# Patient Record
Sex: Female | Born: 1985 | Race: Black or African American | Hispanic: Yes | Marital: Single | Smoking: Former smoker
Health system: Southern US, Community
[De-identification: ages and names within clinical notes are randomized; demographics above are authoritative.]

## PROBLEM LIST (undated history)

## (undated) DIAGNOSIS — R011 Cardiac murmur, unspecified: Secondary | ICD-10-CM

## (undated) DIAGNOSIS — T7840XA Allergy, unspecified, initial encounter: Secondary | ICD-10-CM

## (undated) DIAGNOSIS — J45909 Unspecified asthma, uncomplicated: Secondary | ICD-10-CM

## (undated) HISTORY — DX: Unspecified asthma, uncomplicated: J45.909

## (undated) HISTORY — PX: LAPAROSCOPY: SHX197

## (undated) HISTORY — PX: COLPOSCOPY: SHX161

## (undated) HISTORY — DX: Allergy, unspecified, initial encounter: T78.40XA

## (undated) HISTORY — PX: WISDOM TOOTH EXTRACTION: SHX21

---
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
## 2017-01-30 ENCOUNTER — Encounter (HOSPITAL_COMMUNITY): Payer: Self-pay | Admitting: Family Medicine

## 2017-01-30 ENCOUNTER — Ambulatory Visit (HOSPITAL_COMMUNITY)
Admission: EM | Admit: 2017-01-30 | Discharge: 2017-01-30 | Disposition: A | Discharge status: Home / Self Care | Payer: Self-pay | Attending: Family Medicine | Admitting: Family Medicine

## 2017-01-30 DIAGNOSIS — R51 Headache: Secondary | ICD-10-CM

## 2017-01-30 DIAGNOSIS — R519 Headache, unspecified: Secondary | ICD-10-CM

## 2017-01-30 DIAGNOSIS — G43109 Migraine with aura, not intractable, without status migrainosus: Secondary | ICD-10-CM

## 2017-01-30 MED ORDER — SUMATRIPTAN SUCCINATE 100 MG PO TABS: 100.0000 mg | ORAL_TABLET | ORAL | 0 refills | Status: DC | PRN

## 2017-01-30 MED ORDER — METHYLPREDNISOLONE 4 MG PO TBPK: ORAL_TABLET | ORAL | 0 refills | Status: DC

## 2017-01-30 MED ORDER — CYCLOBENZAPRINE HCL 5 MG PO TABS: 5.0000 mg | ORAL_TABLET | Freq: Three times a day (TID) | ORAL | 0 refills | Status: DC | PRN

## 2017-01-30 NOTE — ED Provider Notes (Signed)
CSN: [id number]     Arrival date & time 01/30/17  1212 History   None    Chief Complaint  Patient presents with  . Headache   (Consider location/radiation/quality/duration/timing/severity/associated sxs/prior Treatment) Patient c/o headaches. She has hx of migraine headaches.  She has an occipital headache for last 5 days.  She gets occasional migraine sx's.   The history is provided by the patient.  Headache  Pain location:  Occipital Quality:  Dull Radiates to:  Does not radiate Severity currently:  5/10 Severity at highest:  10/10 Onset quality:  Sudden Duration:  5 days Timing:  Constant Progression:  Worsening Chronicity:  New Similar to prior headaches: no   Relieved by:  Nothing Worsened by:  Nothing   History reviewed. No pertinent past medical history. History reviewed. No pertinent surgical history. History reviewed. No pertinent family history. Social History  Substance Use Topics  . Smoking status: Not on file  . Smokeless tobacco: Not on file  . Alcohol use Not on file   OB History    No data available     Review of Systems  Constitutional: Negative.   HENT: Negative.   Eyes: Negative.   Respiratory: Negative.   Cardiovascular: Negative.   Gastrointestinal: Negative.   Endocrine: Negative.   Genitourinary: Negative.   Musculoskeletal: Negative.   Allergic/Immunologic: Negative.   Neurological: Positive for headaches.  Hematological: Negative.   Psychiatric/Behavioral: Negative.     Allergies  Benadryl [diphenhydramine hcl]  Home Medications   Prior to Admission medications   Medication Sig Start Date End Date Taking? Authorizing Provider  cyclobenzaprine (FLEXERIL) 5 MG tablet Take 1 tablet (5 mg total) by mouth 3 (three) times daily as needed for muscle spasms. 01/30/17   Deatra Canter, FNP  methylPREDNISolone (MEDROL DOSEPAK) 4 MG TBPK tablet Take 6-5-4-3-2-1 po qd 01/30/17   Deatra Canter, FNP  SUMAtriptan (IMITREX) 100 MG tablet  Take 1 tablet (100 mg total) by mouth every 2 (two) hours as needed for migraine. May repeat in 2 hours if headache persists or recurs. 01/30/17   Deatra Canter, FNP   Meds Ordered and Administered this Visit  Medications - No data to display  BP (!) 102/57   Pulse 71   Temp 98.6 F (37 C)   Resp 18   SpO2 100%  No data found.   Physical Exam  Constitutional: She is oriented to person, place, and time. She appears well-developed and well-nourished.  HENT:  Head: Normocephalic and atraumatic.  Eyes: Pupils are equal, round, and reactive to light. Conjunctivae and EOM are normal.  Neck: Normal range of motion. Neck supple.  Cardiovascular: Normal rate, regular rhythm and normal heart sounds.   Pulmonary/Chest: Effort normal and breath sounds normal.  Abdominal: Soft. Bowel sounds are normal.  Neurological: She is alert and oriented to person, place, and time.  Nursing note and vitals reviewed.   Urgent Care Course     Procedures (including critical care time)  Labs Review Labs Reviewed - No data to display  Imaging Review No results found.   Visual Acuity Review  Right Eye Distance:   Left Eye Distance:   Bilateral Distance:    Right Eye Near:   Left Eye Near:    Bilateral Near:         MDM   1. Acute nonintractable headache, unspecified headache type   2. Migraine with aura and without status migrainosus, not intractable    Imitrex 100mg  one po with onset of  headache and repeat in 2 hours prn #10  Medrol dose pack as directed 4mg  #21 Flexeril 5mg  one po tid prn #30      Deatra CanterOxford, Zakia Sainato J, FNP 01/30/17 1327

## 2017-01-30 NOTE — ED Triage Notes (Signed)
Pt here for headache since Thursday. sts in the back of her head. sts that the pain is constant.

## 2017-05-07 ENCOUNTER — Ambulatory Visit (HOSPITAL_COMMUNITY)
Admission: EM | Admit: 2017-05-07 | Discharge: 2017-05-07 | Disposition: A | Discharge status: Home / Self Care | Payer: Self-pay | Attending: Emergency Medicine | Admitting: Emergency Medicine

## 2017-05-07 ENCOUNTER — Encounter (HOSPITAL_COMMUNITY): Payer: Self-pay | Admitting: Emergency Medicine

## 2017-05-07 DIAGNOSIS — R591 Generalized enlarged lymph nodes: Secondary | ICD-10-CM

## 2017-05-07 NOTE — Discharge Instructions (Signed)
Warm compresses, anti-inflammatories

## 2017-05-07 NOTE — ED Provider Notes (Signed)
[facility]    CSN: [id number] Arrival date & time: 05/07/17  1614     History   Chief Complaint Chief Complaint  Patient presents with  . Mass    HPI Tina [name] is a 31 y.o. female.   31 year-old female, presenting today complaining of possible cyst under her chin  States that this has been present for the past 3-4 years. She noticed pain under her chin this morning when she palpated the are. She is concerned it may be related to the cyst No fever, chills, difficulty swallowing    The history is provided by the patient.  Abscess  Location:  Face Facial abscess location:  Chin Size:  1 cm Abscess quality: painful   Red streaking: no   Progression:  Worsening Pain details:    Quality:  Dull   Severity:  Mild   Timing:  Constant   Progression:  Unchanged Chronicity:  Recurrent Context: not diabetes, not immunosuppression, not injected drug use, not insect bite/sting and not skin injury   Relieved by:  Nothing Exacerbated by: palpation. Ineffective treatments:  None tried Associated symptoms: no anorexia, no fatigue, no fever, no headaches, no nausea and no vomiting   Risk factors: no family hx of MRSA, no hx of MRSA and no prior abscess     History reviewed. No pertinent past medical history.  There are no active problems to display for this patient.   History reviewed. No pertinent surgical history.  OB History    No data available       Home Medications    Prior to Admission medications   Medication Sig Start Date End Date Taking? Authorizing Provider  cyclobenzaprine (FLEXERIL) 5 MG tablet Take 1 tablet (5 mg total) by mouth 3 (three) times daily as needed for muscle spasms. 01/30/17   Deatra Canterxford, William J, FNP  methylPREDNISolone (MEDROL DOSEPAK) 4 MG TBPK tablet Take 6-5-4-3-2-1 po qd 01/30/17   Deatra Canterxford, William J, FNP  SUMAtriptan (IMITREX) 100 MG tablet Take 1 tablet (100 mg total) by mouth every 2 (two) hours as needed for migraine. May  repeat in 2 hours if headache persists or recurs. 01/30/17   Deatra Canterxford, William J, FNP    Family History History reviewed. No pertinent family history.  Social History Social History  Substance Use Topics  . Smoking status: Not on file  . Smokeless tobacco: Not on file  . Alcohol use Not on file     Allergies   Benadryl [diphenhydramine hcl]   Review of Systems Review of Systems  Constitutional: Negative for chills, fatigue and fever.  HENT: Negative for ear pain and sore throat.   Eyes: Negative for pain and visual disturbance.  Respiratory: Negative for cough and shortness of breath.   Cardiovascular: Negative for chest pain and palpitations.  Gastrointestinal: Negative for abdominal pain, anorexia, nausea and vomiting.  Genitourinary: Negative for dysuria and hematuria.  Musculoskeletal: Negative for arthralgias and back pain.  Skin: Negative for color change and rash.       Swelling under the chin   Neurological: Negative for seizures, syncope and headaches.  All other systems reviewed and are negative.    Physical Exam Triage Vital Signs ED Triage Vitals  Enc Vitals Group     BP 05/07/17 1707 115/66     Pulse Rate 05/07/17 1707 73     Resp 05/07/17 1707 18     Temp 05/07/17 1707 98.4 F (36.9 C)     Temp Source 05/07/17 1707  Oral     SpO2 05/07/17 1707 100 %     Weight --      Height --      Head Circumference --      Peak Flow --      Pain Score 05/07/17 1708 6     Pain Loc --      Pain Edu? --      Excl. in GC? --    No data found.   Updated Vital Signs BP 115/66 (BP Location: Right Arm)   Pulse 73   Temp 98.4 F (36.9 C) (Oral)   Resp 18   SpO2 100%   Visual Acuity Right Eye Distance:   Left Eye Distance:   Bilateral Distance:    Right Eye Near:   Left Eye Near:    Bilateral Near:     Physical Exam  Constitutional: She is oriented to person, place, and time. She appears well-developed and well-nourished. No distress.  HENT:  Head:  Normocephalic.  Small area of swelling under the right side of the chin consistent with lymphadenopathy.  Eyes: Pupils are equal, round, and reactive to light. EOM are normal.  Neck: Normal range of motion.  Cardiovascular: Normal rate.   Pulmonary/Chest: Effort normal and breath sounds normal.  Abdominal: Soft.  Musculoskeletal: Normal range of motion.  Neurological: She is alert and oriented to person, place, and time.  Skin: Skin is warm. She is not diaphoretic.  Psychiatric: She has a normal mood and affect.  Nursing note and vitals reviewed.    UC Treatments / Results  Labs (all labs ordered are listed, but only abnormal results are displayed) Labs Reviewed - No data to display  EKG  EKG Interpretation None       Radiology No results found.  Procedures Procedures (including critical care time)  Medications Ordered in UC Medications - No data to display   Initial Impression / Assessment and Plan / UC Course  I have reviewed the triage vital signs and the nursing notes.  Pertinent labs & imaging results that were available during my care of the patient were reviewed by me and considered in my medical decision making (see chart for details).     Lymphadenopathy.   Final Clinical Impressions(s) / UC Diagnoses   Final diagnoses:  Lymphadenopathy of head and neck    New Prescriptions Discharge Medication List as of 05/07/2017  5:38 PM       Controlled Substance Prescriptions [place] Controlled Substance Registry consulted? Not Applicable   Alecia Lemming, [place] 05/07/17 1821

## 2017-05-07 NOTE — ED Triage Notes (Signed)
Pt sts knot under chin noticed today that is tender to palpation

## 2017-06-07 ENCOUNTER — Encounter (HOSPITAL_COMMUNITY): Payer: Self-pay | Admitting: *Deleted

## 2017-06-07 ENCOUNTER — Ambulatory Visit (HOSPITAL_COMMUNITY)
Admission: EM | Admit: 2017-06-07 | Discharge: 2017-06-07 | Disposition: A | Discharge status: Home / Self Care | Payer: Self-pay | Attending: Emergency Medicine | Admitting: Emergency Medicine

## 2017-06-07 DIAGNOSIS — L03012 Cellulitis of left finger: Secondary | ICD-10-CM

## 2017-06-07 MED ORDER — CEPHALEXIN 500 MG PO CAPS: 500.0000 mg | ORAL_CAPSULE | Freq: Three times a day (TID) | ORAL | 0 refills | Status: AC

## 2017-06-07 NOTE — Discharge Instructions (Signed)
Soak finger twice a day.  Complete course of antibiotics. If symptoms worsen or do not improve in the next week to return to be seen or to follow up with PCP.

## 2017-06-07 NOTE — ED Triage Notes (Signed)
Patient reports pulling cuticle to left middle finger and now has swelling and discoloration.

## 2017-06-07 NOTE — ED Provider Notes (Signed)
[facility]    CSN: [id number] Arrival date & time: 06/07/17  1144     History   Chief Complaint Chief Complaint  Patient presents with  . Finger Injury    HPI Tina [name] is a 31 y.o. female.   Tina [name] presents with complaints of left hand middle finger pain and swelling. She states she pulled a cuticle from the finger last week. Swelling and tenderness developed approximately 11/25. Has not drained. Rates pain 7/10. Without fevers or chills. Has been soaking it and applying tea tree oil and neosporin which have not helped. No known history of MRSA. Full ROM to finger and hand.   ROS per HPI.       History reviewed. No pertinent past medical history.  There are no active problems to display for this patient.   History reviewed. No pertinent surgical history.  OB History    No data available       Home Medications    Prior to Admission medications   Medication Sig Start Date End Date Taking? Authorizing Provider  cephALEXin (KEFLEX) 500 MG capsule Take 1 capsule (500 mg total) by mouth 3 (three) times daily for 7 days. 06/07/17 06/14/17  Georgetta HaberBurky, Brittane Grudzinski B, NP  cyclobenzaprine (FLEXERIL) 5 MG tablet Take 1 tablet (5 mg total) by mouth 3 (three) times daily as needed for muscle spasms. 01/30/17   Deatra Canterxford, William J, FNP  methylPREDNISolone (MEDROL DOSEPAK) 4 MG TBPK tablet Take 6-5-4-3-2-1 po qd 01/30/17   Deatra Canterxford, William J, FNP  SUMAtriptan (IMITREX) 100 MG tablet Take 1 tablet (100 mg total) by mouth every 2 (two) hours as needed for migraine. May repeat in 2 hours if headache persists or recurs. 01/30/17   Deatra Canterxford, William J, FNP    Family History Family History  Problem Relation Age of Onset  . Diabetes Mother   . Hypertension Father     Social History Social History   Tobacco Use  . Smoking status: Never Smoker  . Smokeless tobacco: Never Used  Substance Use Topics  . Alcohol use: Yes    Frequency: Never    Comment: occ  . Drug use: Not on  file     Allergies   Benadryl [diphenhydramine hcl]   Review of Systems Review of Systems   Physical Exam Triage Vital Signs ED Triage Vitals  Enc Vitals Group     BP 06/07/17 1229 107/72     Pulse Rate 06/07/17 1229 86     Resp 06/07/17 1229 17     Temp 06/07/17 1229 98.3 F (36.8 C)     Temp Source 06/07/17 1229 Oral     SpO2 06/07/17 1229 100 %     Weight --      Height --      Head Circumference --      Peak Flow --      Pain Score 06/07/17 1227 0     Pain Loc --      Pain Edu? --      Excl. in GC? --    No data found.  Updated Vital Signs BP 107/72 (BP Location: Left Arm)   Pulse 86   Temp 98.3 F (36.8 C) (Oral)   Resp 17   LMP 05/13/2017   SpO2 100%   Visual Acuity Right Eye Distance:   Left Eye Distance:   Bilateral Distance:    Right Eye Near:   Left Eye Near:    Bilateral Near:     Physical Exam  Constitutional: She is oriented to person, place, and time. She appears well-developed and well-nourished. No distress.  Cardiovascular: Normal rate, regular rhythm and normal heart sounds.  Pulmonary/Chest: Effort normal and breath sounds normal.  Musculoskeletal:       Left hand: She exhibits tenderness and swelling. She exhibits normal range of motion, no bony tenderness, normal capillary refill, no deformity and no laceration. Normal sensation noted. Normal strength noted.  Left hand middle finger with lateral nail bed with swelling and tenderness noted, consistent with paronychia; without drainage  Neurological: She is alert and oriented to person, place, and time.  Skin: Skin is warm and dry.  Vitals reviewed.    UC Treatments / Results  Labs (all labs ordered are listed, but only abnormal results are displayed) Labs Reviewed - No data to display  EKG  EKG Interpretation None       Radiology No results found.  Procedures Procedures (including critical care time)  Medications Ordered in UC Medications - No data to  display   Initial Impression / Assessment and Plan / UC Course  I have reviewed the triage vital signs and the nursing notes.  Pertinent labs & imaging results that were available during my care of the patient were reviewed by me and considered in my medical decision making (see chart for details).     Continue to soak finger twice a day. Complete course of antibiotics. Return precautions provided. Patient verbalized understanding and agreeable to plan.     Final Clinical Impressions(s) / UC Diagnoses   Final diagnoses:  Paronychia of finger of left hand    ED Discharge Orders        Ordered    cephALEXin (KEFLEX) 500 MG capsule  3 times daily     06/07/17 1253       Controlled Substance Prescriptions [place] Controlled Substance Registry consulted? Not Applicable   Georgetta HaberBurky, Tyreque Finken B, NP 06/07/17 1301

## 2017-08-20 ENCOUNTER — Other Ambulatory Visit (HOSPITAL_COMMUNITY): Payer: Self-pay | Admitting: *Deleted

## 2017-08-20 DIAGNOSIS — N6320 Unspecified lump in the left breast, unspecified quadrant: Secondary | ICD-10-CM

## 2017-08-23 ENCOUNTER — Ambulatory Visit
Admission: RE | Admit: 2017-08-23 | Discharge: 2017-08-23 | Disposition: A | Discharge status: Home / Self Care | Payer: No Typology Code available for payment source | Source: Ambulatory Visit | Attending: Obstetrics and Gynecology | Admitting: Obstetrics and Gynecology

## 2017-08-23 ENCOUNTER — Encounter (HOSPITAL_COMMUNITY): Payer: Self-pay

## 2017-08-23 ENCOUNTER — Other Ambulatory Visit (HOSPITAL_COMMUNITY): Payer: Self-pay | Admitting: Obstetrics and Gynecology

## 2017-08-23 ENCOUNTER — Ambulatory Visit (HOSPITAL_COMMUNITY)
Admission: RE | Admit: 2017-08-23 | Discharge: 2017-08-23 | Disposition: A | Discharge status: Home / Self Care | Payer: Self-pay | Source: Ambulatory Visit | Attending: Obstetrics and Gynecology | Admitting: Obstetrics and Gynecology

## 2017-08-23 VITALS — BP 110/68 | Ht 59.0 in | Wt 103.4 lb

## 2017-08-23 DIAGNOSIS — N6320 Unspecified lump in the left breast, unspecified quadrant: Secondary | ICD-10-CM

## 2017-08-23 DIAGNOSIS — N6321 Unspecified lump in the left breast, upper outer quadrant: Secondary | ICD-10-CM

## 2017-08-23 DIAGNOSIS — Z1239 Encounter for other screening for malignant neoplasm of breast: Secondary | ICD-10-CM

## 2017-08-23 DIAGNOSIS — N644 Mastodynia: Secondary | ICD-10-CM

## 2017-08-23 HISTORY — DX: Cardiac murmur, unspecified: R01.1

## 2017-08-23 NOTE — Patient Instructions (Signed)
Explained breast self awareness with Tina RichesAmanda [name]. Patient did not need a Pap smear today due to last Pap smear was in March 2017 per patient. Let her know [facility] will cover Pap smears every 3 years unless has a history of abnormal Pap smears. Reminded patient that her next Pap smear is due next March 2020. [facility] and left breast ultrasound. Appointment scheduled for Thursday, August 23, 2017 at 1250. Patient aware of appointment and will be there. Tina RichesAmanda [name] verbalized understanding.  Tina [name], Tina Maserhristine Poll, RN 10:59 AM

## 2017-08-23 NOTE — Progress Notes (Signed)
Complaints of left breast lump since January and pain that started on 08/18/2017. Patient states she noticed the lump increased in size and starting causing pain this past weekend. Patient states the pain comes and goes. Patient rates the pain at a 3-4 out of 10.  Pap Smear: Pap smear not completed today. [place] in [place] and normal per patient. Per patient has a history of an abnormal Pap smear in 2008 or 2009 that a colposcopy was completed for follow-up. Patient states all Pap smears have been normal since colposcopy and that she has had at least three normal Pap smears. No Pap smear results are in Epic.  Physical exam: Breasts Breasts symmetrical. No skin abnormalities bilateral breasts. No nipple retraction bilateral breasts. No nipple discharge bilateral breasts. No lymphadenopathy. No lumps palpated right breast. Palpated a lump within the left breast at 1 o'clock 5 cm from the nipple. Complaints of left outer breast tenderness on exam. [facility] diagnostic mamm[facility] and left breast ultrasound. Appointment scheduled for Thursday, August 23, 2017 at 1250.        Pelvic/Bimanual No Pap smear completed today since last Pap smear was in March 2017 per patient. Pap smear not indicated per BCCCP guidelines.   Smoking History: Patient has never smoked.  Patient Navigation: Patient education provided. [facility].

## 2017-08-24 ENCOUNTER — Encounter (HOSPITAL_COMMUNITY): Payer: Self-pay | Admitting: *Deleted

## 2017-12-27 ENCOUNTER — Other Ambulatory Visit (HOSPITAL_COMMUNITY): Payer: Self-pay | Admitting: *Deleted

## 2017-12-27 DIAGNOSIS — N6320 Unspecified lump in the left breast, unspecified quadrant: Secondary | ICD-10-CM

## 2018-01-03 ENCOUNTER — Emergency Department (HOSPITAL_BASED_OUTPATIENT_CLINIC_OR_DEPARTMENT_OTHER)
Admission: EM | Admit: 2018-01-03 | Discharge: 2018-01-03 | Disposition: A | Discharge status: Home / Self Care | Payer: Self-pay | Attending: Emergency Medicine | Admitting: Emergency Medicine

## 2018-01-03 ENCOUNTER — Other Ambulatory Visit: Payer: Self-pay

## 2018-01-03 ENCOUNTER — Encounter (HOSPITAL_BASED_OUTPATIENT_CLINIC_OR_DEPARTMENT_OTHER): Payer: Self-pay | Admitting: Emergency Medicine

## 2018-01-03 DIAGNOSIS — M542 Cervicalgia: Secondary | ICD-10-CM | POA: Insufficient documentation

## 2018-01-03 LAB — RAPID STREP SCREEN (MED CTR MEBANE ONLY): Streptococcus, Group A Screen (Direct): NEGATIVE

## 2018-01-03 NOTE — Discharge Instructions (Signed)
Try Tylenol or Ibuprofen for pain Return if your are worsening

## 2018-01-03 NOTE — ED Notes (Signed)
ED Provider at bedside. 

## 2018-01-03 NOTE — ED Provider Notes (Signed)
[facility] EMERGENCY DEPARTMENT Provider Note   CSN: [id number] Arrival date & time: 01/03/18  1501     History   Chief Complaint Chief Complaint  Patient presents with  . Neck Pain    HPI Tina [name] is a 32 y.o. female who presents with right sided neck pain. No significant PMH. She states that she first noticed the pain yesterday. She reports she had some discomfort over the right side of her neck and some nausea. She drank tea which seemed to make her symptoms worse. Nothing had made it better. She has never had these symptoms before. She denies fever, chills, posterior neck pain, neck trauma, numbness/tingling/weakness of the arms. She denies headache, URI symptoms, sore throat. She is unsure of any sick contacts but recently got back from a trip from [place]. She is concerned because she has been diagnosed with a cyst under her chin in the past. It has slowly gotten bigger but has not been painful, red, draining, etc.  HPI  Past Medical History:  Diagnosis Date  . Heart murmur     There are no active problems to display for this patient.   Past Surgical History:  Procedure Laterality Date  . BREAST EXCISIONAL BIOPSY Left 2007  . COLPOSCOPY    . LAPAROSCOPY    . WISDOM TOOTH EXTRACTION       OB History    Gravida  0   Para  0   Term  0   Preterm  0   AB  0   Living  0     SAB  0   TAB  0   Ectopic  0   Multiple  0   Live Births  0            Home Medications    Prior to Admission medications   Medication Sig Start Date End Date Taking? Authorizing Provider  cyclobenzaprine (FLEXERIL) 5 MG tablet Take 1 tablet (5 mg total) by mouth 3 (three) times daily as needed for muscle spasms. Patient not taking: Reported on 08/23/2017 01/30/17   Deatra Canter, FNP  methylPREDNISolone (MEDROL DOSEPAK) 4 MG TBPK tablet Take 6-5-4-3-2-1 po qd Patient not taking: Reported on 08/23/2017 01/30/17   Deatra Canter, FNP  SUMAtriptan  (IMITREX) 100 MG tablet Take 1 tablet (100 mg total) by mouth every 2 (two) hours as needed for migraine. May repeat in 2 hours if headache persists or recurs. Patient not taking: Reported on 08/23/2017 01/30/17   Deatra Canter, FNP    Family History Family History  Problem Relation Age of Onset  . Diabetes Mother   . Hypertension Father   . Cancer Maternal Aunt        colon  . Lupus Maternal Aunt   . Breast cancer Maternal Grandmother   . Breast cancer Paternal Grandmother   . Hypertension Maternal Aunt   . Diabetes Maternal Aunt     Social History Social History   Tobacco Use  . Smoking status: Never Smoker  . Smokeless tobacco: Never Used  Substance Use Topics  . Alcohol use: Yes    Frequency: Never    Comment: occ  . Drug use: No     Allergies   Benadryl [diphenhydramine hcl]   Review of Systems Review of Systems  Constitutional: Negative for fever.  Musculoskeletal: Positive for neck pain.     Physical Exam Updated Vital Signs BP 126/77   Pulse 99   Temp 98.1 F (36.7 C) (  Oral)   Resp 20   Ht 4\' 11"  (1.499 m)   Wt 50.8 kg (112 lb)   LMP 12/29/2017 (Exact Date)   SpO2 100%   BMI 22.62 kg/m   Physical Exam  Constitutional: She is oriented to person, place, and time. She appears well-developed and well-nourished. No distress.  HENT:  Head: Normocephalic and atraumatic.  Right Ear: Tympanic membrane normal.  Left Ear: Tympanic membrane normal.  Nose: Nose normal.  Mouth/Throat: Uvula is midline, oropharynx is clear and moist and mucous membranes are normal. No trismus in the jaw.  Eyes: Pupils are equal, round, and reactive to light. Conjunctivae are normal. Right eye exhibits no discharge. Left eye exhibits no discharge. No scleral icterus.  Neck: Normal range of motion.  Tenderness over the right anterior cervical chain  No tenderness over the chin  Cardiovascular: Normal rate and regular rhythm.  Pulmonary/Chest: Effort normal and breath  sounds normal. No respiratory distress.  Abdominal: She exhibits no distension.  Neurological: She is alert and oriented to person, place, and time.  Skin: Skin is warm and dry.  Psychiatric: She has a normal mood and affect. Her behavior is normal.  Nursing note and vitals reviewed.    ED Treatments / Results  Labs (all labs ordered are listed, but only abnormal results are displayed) Labs Reviewed  RAPID STREP SCREEN ([facility] ONLY)  CULTURE, GROUP A STREP [facility])    EKG None  Radiology No results found.  Procedures Procedures (including critical care time)  Medications Ordered in ED Medications - No data to display   Initial Impression / Assessment and Plan / ED Course  I have reviewed the triage vital signs and the nursing notes.  Pertinent labs & imaging results that were available during my care of the patient were reviewed by me and considered in my medical decision making (see chart for details).  32 year old presents with right sided anterior neck pain for one day. Her vitals are normal. She is well appearing. She is tender over the anterior cervical chain however I do not feel or observe any significant lymphadenopathy. Her HENT exam is unremarkable. Will obtain rapid strep.  Strep is negative. She was given reassurance and return precautions for worsening symptoms.  Final Clinical Impressions(s) / ED Diagnoses   Final diagnoses:  Anterior neck pain    ED Discharge Orders    None       Bethel BornGekas, Christalynn Bo[place] Marie, PA-C 01/03/18 1714    Gwyneth SproutPlunkett, [place], MD 01/04/18 1328

## 2018-01-03 NOTE — ED Triage Notes (Signed)
Reports right sided neck pain which began yesterday.  Denies pain when swallowing but states tender on palpation.

## 2018-01-05 LAB — CULTURE, GROUP A STREP (THRC)

## 2018-01-08 ENCOUNTER — Ambulatory Visit (HOSPITAL_COMMUNITY): Payer: Self-pay

## 2018-01-08 ENCOUNTER — Other Ambulatory Visit: Payer: Self-pay

## 2018-02-27 ENCOUNTER — Ambulatory Visit
Admission: RE | Admit: 2018-02-27 | Discharge: 2018-02-27 | Disposition: A | Discharge status: Home / Self Care | Payer: Self-pay | Source: Ambulatory Visit | Attending: Obstetrics and Gynecology | Admitting: Obstetrics and Gynecology

## 2018-02-27 ENCOUNTER — Other Ambulatory Visit (HOSPITAL_COMMUNITY): Payer: Self-pay | Admitting: Obstetrics and Gynecology

## 2018-02-27 DIAGNOSIS — N6320 Unspecified lump in the left breast, unspecified quadrant: Secondary | ICD-10-CM

## 2018-08-07 ENCOUNTER — Emergency Department (HOSPITAL_BASED_OUTPATIENT_CLINIC_OR_DEPARTMENT_OTHER)
Admission: EM | Admit: 2018-08-07 | Discharge: 2018-08-07 | Disposition: A | Discharge status: Home / Self Care | Payer: BC Managed Care – PPO | Attending: Emergency Medicine | Admitting: Emergency Medicine

## 2018-08-07 ENCOUNTER — Emergency Department (HOSPITAL_BASED_OUTPATIENT_CLINIC_OR_DEPARTMENT_OTHER): Payer: BC Managed Care – PPO

## 2018-08-07 ENCOUNTER — Encounter (HOSPITAL_BASED_OUTPATIENT_CLINIC_OR_DEPARTMENT_OTHER): Payer: Self-pay | Admitting: *Deleted

## 2018-08-07 ENCOUNTER — Other Ambulatory Visit: Payer: Self-pay

## 2018-08-07 DIAGNOSIS — N939 Abnormal uterine and vaginal bleeding, unspecified: Secondary | ICD-10-CM | POA: Diagnosis not present

## 2018-08-07 DIAGNOSIS — N898 Other specified noninflammatory disorders of vagina: Secondary | ICD-10-CM | POA: Diagnosis present

## 2018-08-07 DIAGNOSIS — R1030 Lower abdominal pain, unspecified: Secondary | ICD-10-CM | POA: Diagnosis not present

## 2018-08-07 DIAGNOSIS — N760 Acute vaginitis: Secondary | ICD-10-CM

## 2018-08-07 DIAGNOSIS — B9689 Other specified bacterial agents as the cause of diseases classified elsewhere: Secondary | ICD-10-CM | POA: Insufficient documentation

## 2018-08-07 LAB — WET PREP, GENITAL
Sperm: NONE SEEN
Trich, Wet Prep: NONE SEEN
Yeast Wet Prep HPF POC: NONE SEEN

## 2018-08-07 LAB — URINALYSIS, MICROSCOPIC (REFLEX)

## 2018-08-07 LAB — CBC WITH DIFFERENTIAL/PLATELET
Abs Immature Granulocytes: 0.02 10*3/uL (ref 0.00–0.07)
BASOS ABS: 0 10*3/uL (ref 0.0–0.1)
BASOS PCT: 0 %
EOS ABS: 0.3 10*3/uL (ref 0.0–0.5)
Eosinophils Relative: 5 %
HEMATOCRIT: 36.6 % (ref 36.0–46.0)
Hemoglobin: 11.4 g/dL — ABNORMAL LOW (ref 12.0–15.0)
Immature Granulocytes: 0 %
LYMPHS ABS: 1.9 10*3/uL (ref 0.7–4.0)
Lymphocytes Relative: 34 %
MCH: 29.4 pg (ref 26.0–34.0)
MCHC: 31.1 g/dL (ref 30.0–36.0)
MCV: 94.3 fL (ref 80.0–100.0)
MONOS PCT: 10 %
Monocytes Absolute: 0.6 10*3/uL (ref 0.1–1.0)
NRBC: 0 % (ref 0.0–0.2)
Neutro Abs: 2.8 10*3/uL (ref 1.7–7.7)
Neutrophils Relative %: 51 %
Platelets: 305 10*3/uL (ref 150–400)
RBC: 3.88 MIL/uL (ref 3.87–5.11)
RDW: 12.8 % (ref 11.5–15.5)
WBC: 5.6 10*3/uL (ref 4.0–10.5)

## 2018-08-07 LAB — PREGNANCY, URINE: Preg Test, Ur: NEGATIVE

## 2018-08-07 LAB — URINALYSIS, ROUTINE W REFLEX MICROSCOPIC
Bilirubin Urine: NEGATIVE
GLUCOSE, UA: NEGATIVE mg/dL
Ketones, ur: NEGATIVE mg/dL
LEUKOCYTES UA: NEGATIVE
Nitrite: NEGATIVE
PROTEIN: NEGATIVE mg/dL
SPECIFIC GRAVITY, URINE: 1.02 (ref 1.005–1.030)
pH: 7 (ref 5.0–8.0)

## 2018-08-07 MED ORDER — METRONIDAZOLE 500 MG PO TABS: 500.0000 mg | ORAL_TABLET | Freq: Two times a day (BID) | ORAL | 0 refills | Status: DC

## 2018-08-07 NOTE — ED Provider Notes (Signed)
[facility] EMERGENCY DEPARTMENT Provider Note   CSN: [id number] Arrival date & time: 08/07/18  1614     History   Chief Complaint Chief Complaint  Patient presents with  . Vaginal Discharge    HPI Tina [name] is a 33 y.o. female.  HPI 33 year old female with no pertinent past medical history presents to the emergency department today for evaluation of vaginal bleeding, vaginal discharge and some lower abdominal pain.  Patient states that yesterday she noticed some vaginal discharge.  She reports that 5 days ago she started her period.  Typically her period is light at the end and having beginning however this time she had some light bleeding at first and has had heavier bleeding yesterday and today.  Patient denies any passage of blood clots.  She reports some lower abdominal cramping.  She denies any focal abdominal pain.  Denies any fevers, chills, nausea or vomiting.  She does see GYN and has been treated for BV in the past.  Patient denies any urinary symptoms.  She has taken no medications for her symptoms prior to arrival.  She is sexually active with one female partner does not use protection but has no concern for STD.  She states that she is going through approximately 4 pads a day which is not unusual for patient.  She is just concerned that it was more heavy at the end of her period not be getting. Past Medical History:  Diagnosis Date  . Heart murmur     There are no active problems to display for this patient.   Past Surgical History:  Procedure Laterality Date  . BREAST EXCISIONAL BIOPSY Left 2007  . COLPOSCOPY    . LAPAROSCOPY    . WISDOM TOOTH EXTRACTION       OB History    Gravida  0   Para  0   Term  0   Preterm  0   AB  0   Living  0     SAB  0   TAB  0   Ectopic  0   Multiple  0   Live Births  0            Home Medications    Prior to Admission medications   Medication Sig Start Date End Date Taking? Authorizing  Provider  metroNIDAZOLE (FLAGYL) 500 MG tablet Take 1 tablet (500 mg total) by mouth 2 (two) times daily with a meal. DO NOT CONSUME ALCOHOL WHILE TAKING THIS MEDICATION. 08/07/18   Rise Mu, PA-C    Family History Family History  Problem Relation Age of Onset  . Diabetes Mother   . Hypertension Father   . Cancer Maternal Aunt        colon  . Lupus Maternal Aunt   . Breast cancer Maternal Grandmother   . Breast cancer Paternal Grandmother   . Hypertension Maternal Aunt   . Diabetes Maternal Aunt     Social History Social History   Tobacco Use  . Smoking status: Never Smoker  . Smokeless tobacco: Never Used  Substance Use Topics  . Alcohol use: Yes    Frequency: Never    Comment: occ  . Drug use: No     Allergies   Benadryl [diphenhydramine hcl]   Review of Systems Review of Systems  Constitutional: Negative for chills and fever.  Respiratory: Negative for shortness of breath.   Cardiovascular: Negative for chest pain.  Gastrointestinal: Positive for abdominal pain. Negative for nausea and  vomiting.  Genitourinary: Positive for vaginal bleeding and vaginal discharge. Negative for dysuria, frequency, hematuria, pelvic pain and vaginal pain.  Musculoskeletal: Negative for myalgias.  Skin: Negative for color change.  Neurological: Negative for dizziness, light-headedness and headaches.     Physical Exam Updated Vital Signs BP 118/72 (BP Location: Right Arm)   Pulse 65   Temp 98.4 F (36.9 C) (Oral)   Resp 16   Ht 4\' 11"  (1.499 m)   Wt 47.6 kg   LMP 08/03/2018   SpO2 100%   BMI 21.21 kg/m   Physical Exam Vitals signs and nursing note reviewed.  Constitutional:      General: She is not in acute distress.    Appearance: She is well-developed. She is not toxic-appearing.  HENT:     Head: Normocephalic and atraumatic.     Nose: Nose normal.  Eyes:     General:        Right eye: No discharge.        Left eye: No discharge.      Conjunctiva/sclera: Conjunctivae normal.     Pupils: Pupils are equal, round, and reactive to light.  Neck:     Musculoskeletal: Normal range of motion and neck supple.  Cardiovascular:     Rate and Rhythm: Normal rate and regular rhythm.     Heart sounds: Normal heart sounds.  Pulmonary:     Effort: Pulmonary effort is normal. No respiratory distress.     Breath sounds: Normal breath sounds.  Chest:     Chest wall: No tenderness.  Abdominal:     General: Bowel sounds are normal.     Palpations: Abdomen is soft.     Tenderness: There is no abdominal tenderness. There is no right CVA tenderness, left CVA tenderness, guarding or rebound.  Genitourinary:    Comments: Chaperone present for exam. No external lesions, swelling, erythema, or rash of the labia. No erythema, discharge, or lesions noted in the vaginal vault. No CMT tenderness, or friability.  Blood noted in vaginal vault.  There is no clots noted.  No adnexal tenderness, mass or fullness bilaterally. No inguinal adenopathy or hernia.   Musculoskeletal: Normal range of motion.        General: No tenderness.  Lymphadenopathy:     Cervical: No cervical adenopathy.  Skin:    General: Skin is warm and dry.     Capillary Refill: Capillary refill takes less than 2 seconds.  Neurological:     Mental Status: She is alert and oriented to person, place, and time.  Psychiatric:        Behavior: Behavior normal.        Thought Content: Thought content normal.        Judgment: Judgment normal.      ED Treatments / Results  Labs (all labs ordered are listed, but only abnormal results are displayed) Labs Reviewed  WET PREP, GENITAL - Abnormal; Notable for the following components:      Result Value   Clue Cells Wet Prep HPF POC PRESENT (*)    WBC, Wet Prep HPF POC FEW (*)    All other components within normal limits  URINALYSIS, ROUTINE W REFLEX MICROSCOPIC - Abnormal; Notable for the following components:   Hgb urine dipstick  LARGE (*)    All other components within normal limits  URINALYSIS, MICROSCOPIC (REFLEX) - Abnormal; Notable for the following components:   Bacteria, UA RARE (*)    All other components within normal limits  CBC  WITH DIFFERENTIAL/PLATELET - Abnormal; Notable for the following components:   Hemoglobin 11.4 (*)    All other components within normal limits  PREGNANCY, URINE  GC/CHLAMYDIA PROBE AMP () NOT AT [facility]    EKG None  Radiology US Transvaginal Non-ob  Result Date: 08/07/2018 CLINICAL DATA:  Initial evaluation for acute bilateral pelvic pain. EXAM: TRANSABDOMINAL AND TRANSVAGINAL ULTRASOUND OF PELVIS DOPPLER ULTRASOUND OF OVARIES TECHNIQUE: Both transabdominal and transvaginal ultrasound examinations of the pelvis were performed. Transabdominal technique was performed for global imaging of the pelvis including uterus, ovaries, adnexal regions, and pelvic cul-de-sac. It was necessary to proceed with endovaginal exam following the transabdominal exam to visualize the uterus, endometrium, and ovaries. Color and duplex Doppler ultrasound was utilized to evaluate blood flow to the ovaries. COMPARISON:  None. FINDINGS: Uterus Measurements: 4.9 x 2.7 x 2.2 cm = volume: 23 mL. Uterus demonstrates a heterogeneous echotexture without discrete fibroid or other mass lesion. Endometrium Thickness: 4 mm.  No focal abnormality visualized. Right ovary Measurements: 2.9 x 2.5 x 2.3 cm = volume: 9 mL. Normal appearance/no adnexal mass. Left ovary Not visualized.  No adnexal mass. Pulsed Doppler evaluation of the right ovary demonstrates normal low-resistance arterial and venous waveforms. Other findings Small volume free fluid within the pelvis, presumably physiologic. IMPRESSION: 1. No acute abnormality within the pelvis. 2. Nonvisualization of the left ovary.  No adnexal mass. 3. Normal right ovary without evidence for torsion. 4. Normal sonographic appearance of the uterus and endometrium.  Electronically Signed   By: Rise Mu M.D.   On: 08/07/2018 18:39   US Pelvis Complete  Result Date: 08/07/2018 CLINICAL DATA:  Initial evaluation for acute bilateral pelvic pain. EXAM: TRANSABDOMINAL AND TRANSVAGINAL ULTRASOUND OF PELVIS DOPPLER ULTRASOUND OF OVARIES TECHNIQUE: Both transabdominal and transvaginal ultrasound examinations of the pelvis were performed. Transabdominal technique was performed for global imaging of the pelvis including uterus, ovaries, adnexal regions, and pelvic cul-de-sac. It was necessary to proceed with endovaginal exam following the transabdominal exam to visualize the uterus, endometrium, and ovaries. Color and duplex Doppler ultrasound was utilized to evaluate blood flow to the ovaries. COMPARISON:  None. FINDINGS: Uterus Measurements: 4.9 x 2.7 x 2.2 cm = volume: 23 mL. Uterus demonstrates a heterogeneous echotexture without discrete fibroid or other mass lesion. Endometrium Thickness: 4 mm.  No focal abnormality visualized. Right ovary Measurements: 2.9 x 2.5 x 2.3 cm = volume: 9 mL. Normal appearance/no adnexal mass. Left ovary Not visualized.  No adnexal mass. Pulsed Doppler evaluation of the right ovary demonstrates normal low-resistance arterial and venous waveforms. Other findings Small volume free fluid within the pelvis, presumably physiologic. IMPRESSION: 1. No acute abnormality within the pelvis. 2. Nonvisualization of the left ovary.  No adnexal mass. 3. Normal right ovary without evidence for torsion. 4. Normal sonographic appearance of the uterus and endometrium. Electronically Signed   By: Rise Mu M.D.   On: 08/07/2018 18:39   [place] Art/ven Flow Abd Pelv Doppler  Result Date: 08/07/2018 CLINICAL DATA:  Initial evaluation for acute bilateral pelvic pain. EXAM: TRANSABDOMINAL AND TRANSVAGINAL ULTRASOUND OF PELVIS DOPPLER ULTRASOUND OF OVARIES TECHNIQUE: Both transabdominal and transvaginal ultrasound examinations of the pelvis were  performed. Transabdominal technique was performed for global imaging of the pelvis including uterus, ovaries, adnexal regions, and pelvic cul-de-sac. It was necessary to proceed with endovaginal exam following the transabdominal exam to visualize the uterus, endometrium, and ovaries. Color and duplex Doppler ultrasound was utilized to evaluate blood flow to the ovaries. COMPARISON:  None. FINDINGS:  Uterus Measurements: 4.9 x 2.7 x 2.2 cm = volume: 23 mL. Uterus demonstrates a heterogeneous echotexture without discrete fibroid or other mass lesion. Endometrium Thickness: 4 mm.  No focal abnormality visualized. Right ovary Measurements: 2.9 x 2.5 x 2.3 cm = volume: 9 mL. Normal appearance/no adnexal mass. Left ovary Not visualized.  No adnexal mass. Pulsed Doppler evaluation of the right ovary demonstrates normal low-resistance arterial and venous waveforms. Other findings Small volume free fluid within the pelvis, presumably physiologic. IMPRESSION: 1. No acute abnormality within the pelvis. 2. Nonvisualization of the left ovary.  No adnexal mass. 3. Normal right ovary without evidence for torsion. 4. Normal sonographic appearance of the uterus and endometrium. Electronically Signed   By: Rise Mu M.D.   On: 08/07/2018 18:39    Procedures Procedures (including critical care time)  Medications Ordered in ED Medications - No data to display   Initial Impression / Assessment and Plan / ED Course  I have reviewed the triage vital signs and the nursing notes.  Pertinent labs & imaging results that were available during my care of the patient were reviewed by me and considered in my medical decision making (see chart for details).     Presents to the ED for evaluation of lower abdominal pain, vaginal bleeding, vaginal discharge.  Is currently on her menstrual cycle.  Vital signs reassuring.  Patient is not tachycardic or hypotensive.  She does not have any focal abdominal tenderness to  palpation.  No CVA tenderness.  Pelvic exam is as documented above.  Heart regular rate and rhythm.  Lungs clear to auscultation bilaterally.  Patient hemoglobin 11.5 unknown baseline but no signs of significant decrease in hemoglobin from hemorrhage.  Wet prep does show clue cells.  Patient given Flagyl for bacterial vaginosis.  Gonorrhea and Chlamydia cultures are pending however low suspicion for STD at this time.  UA shows no signs of infection.  She has a leukocytosis.  Urine pregnancy test was negative.  Ultrasound obtained is as documented above.  No acute findings in the pelvis.  Unable to visualize the left ovary however low suspicion for ovarian torsion given patient's presentation.  I offered birth control to patient however she declined.  Will give Flagyl.  Presentation not consistent with EID, ectopic pregnancy, appendicitis, diverticulitis, pyelonephritis, ureterolithiasis, bowel obstruction, cholecystitis, pancreatitis.  Patient to follow-up with GYN in outpatient setting.  Pt is hemodynamically stable, in NAD, & able to ambulate in the ED. Evaluation does not show pathology that would require ongoing emergent intervention or inpatient treatment. I explained the diagnosis to the patient. Pain has been managed & has no complaints prior to dc. Pt is comfortable with above plan and is stable for discharge at this time. All questions were answered prior to disposition. Strict return precautions for f/u to the ED were discussed. Encouraged follow up with PCP.   Final Clinical Impressions(s) / ED Diagnoses   Final diagnoses:  Vaginal discharge  BV (bacterial vaginosis)  Vaginal bleeding  Lower abdominal pain    ED Discharge Orders         Ordered    metroNIDAZOLE (FLAGYL) 500 MG tablet  2 times daily with meals     08/07/18 1901           Wallace Keller 08/07/18 1911    Gwyneth Sprout, MD 08/07/18 [phone]

## 2018-08-07 NOTE — ED Notes (Addendum)
Patient was asked if they could give a urine sample. Patient states that she can not at this time.

## 2018-08-07 NOTE — ED Notes (Signed)
Pt verbalizes understanding of d/c instructions and denies any further needs at this time. 

## 2018-08-07 NOTE — ED Triage Notes (Signed)
Pt c/o vaginal discharge x 6 days

## 2018-08-07 NOTE — Discharge Instructions (Addendum)
Work-up has been reassuring today.  Do have signs of bacterial vaginosis and have treated you with Flagyl.  In terms of your vaginal bleeding have offered you birth control but I understand you would like to defer to your GYN.  If you develop any worsening pain or worsening left lower quadrant abdominal pain return to the ED immediately.  Return for any other reason.

## 2018-08-08 LAB — GC/CHLAMYDIA PROBE AMP (~~LOC~~) NOT AT ARMC
CHLAMYDIA, DNA PROBE: NEGATIVE
Neisseria Gonorrhea: NEGATIVE

## 2018-09-02 ENCOUNTER — Other Ambulatory Visit: Payer: Self-pay

## 2018-09-22 ENCOUNTER — Telehealth: Payer: Self-pay | Admitting: General Practice

## 2018-09-22 NOTE — Telephone Encounter (Signed)
Called patient.  Advised need to reschedule appointment.  She requested callback tomorrow to reschedule.

## 2018-09-23 ENCOUNTER — Ambulatory Visit: Payer: BC Managed Care – PPO | Admitting: Family Medicine

## 2018-09-27 ENCOUNTER — Other Ambulatory Visit: Payer: Self-pay

## 2018-10-04 ENCOUNTER — Ambulatory Visit
Admission: RE | Admit: 2018-10-04 | Discharge: 2018-10-04 | Disposition: A | Discharge status: Home / Self Care | Payer: BC Managed Care – PPO | Source: Ambulatory Visit | Attending: Obstetrics and Gynecology | Admitting: Obstetrics and Gynecology

## 2018-10-04 ENCOUNTER — Other Ambulatory Visit (HOSPITAL_COMMUNITY): Payer: Self-pay | Admitting: Obstetrics and Gynecology

## 2018-10-04 ENCOUNTER — Other Ambulatory Visit: Payer: Self-pay

## 2018-10-04 DIAGNOSIS — N6320 Unspecified lump in the left breast, unspecified quadrant: Secondary | ICD-10-CM

## 2018-10-04 DIAGNOSIS — N63 Unspecified lump in unspecified breast: Secondary | ICD-10-CM

## 2019-01-14 ENCOUNTER — Telehealth (INDEPENDENT_AMBULATORY_CARE_PROVIDER_SITE_OTHER): Payer: BC Managed Care – PPO | Admitting: Family Medicine

## 2019-01-14 ENCOUNTER — Encounter: Payer: Self-pay | Admitting: Family Medicine

## 2019-01-14 DIAGNOSIS — J4520 Mild intermittent asthma, uncomplicated: Secondary | ICD-10-CM | POA: Diagnosis not present

## 2019-01-14 DIAGNOSIS — J4530 Mild persistent asthma, uncomplicated: Secondary | ICD-10-CM | POA: Insufficient documentation

## 2019-01-14 MED ORDER — ALBUTEROL SULFATE HFA 108 (90 BASE) MCG/ACT IN AERS: 2.0000 | INHALATION_SPRAY | Freq: Four times a day (QID) | RESPIRATORY_TRACT | 1 refills | Status: DC | PRN

## 2019-01-14 NOTE — Assessment & Plan Note (Signed)
-  Renew albuterol inhaler  -suspect allergy component as well, referral placed to allergist for allergy testing.

## 2019-01-14 NOTE — Progress Notes (Signed)
Tina [name] - 33 y.o. female MRN [id number]  Date of birth: Aug 18, 1985   This visit type was conducted due to national recommendations for restrictions regarding the COVID-19 Pandemic (e.g. social distancing).  This format is felt to be most appropriate for this patient at this time.  All issues noted in this document were discussed and addressed.  No physical exam was performed (except for noted visual exam findings with Video Visits).  I discussed the limitations of evaluation and management by telemedicine and the availability of in person appointments. The patient expressed understanding and agreed to proceed.  I connected with@ on 01/14/19 at 10:30 AM EDT by a video enabled telemedicine application and verified that I am speaking with the correct person using two identifiers.   Patient Location: Home [place] [id number]   Provider location:   [facility]  Chief Complaint  Patient presents with  . Establish Care    NP/requesting labs,    HPI  Tina [name] is a 33 y.o. female who presents via audio/video conferencing for a telehealth visit today.   This is her initial visit with me.  She has a history of asthma and allergies.  Asthma has been fairly well controlled.  Has used albuterol as needed in the past.  Has managed allergies with claritin-d and flonase previously.  Interested in having allergy testing to see what certain triggers are.  She denies any recent wheezing, cough, or shortness of breath.  She is currently out of albuterol.    She would also like to schedule a CPE with labs.  ROS:  A comprehensive ROS was completed and negative except as noted per HPI  Past Medical History:  Diagnosis Date  . Allergy   . Asthma   . Heart murmur     Past Surgical History:  Procedure Laterality Date  . BREAST EXCISIONAL BIOPSY Left 2007  . COLPOSCOPY    . LAPAROSCOPY    . WISDOM TOOTH EXTRACTION      Family History  Problem Relation Age of  Onset  . Diabetes Mother   . Hypertension Father   . Cancer Maternal Aunt        colon  . Lupus Maternal Aunt   . Breast cancer Maternal Grandmother   . Breast cancer Paternal Grandmother   . Hypertension Maternal Aunt   . Diabetes Maternal Aunt     Social History   Socioeconomic History  . Marital status: Single    Spouse name: Not on file  . Number of children: Not on file  . Years of education: Not on file  . Highest education level: Not on file  Occupational History  . Not on file  Social Needs  . Financial resource strain: Not on file  . Food insecurity    Worry: Not on file    Inability: Not on file  . Transportation needs    Medical: Not on file    Non-medical: Not on file  Tobacco Use  . Smoking status: Never Smoker  . Smokeless tobacco: Never Used  Substance and Sexual Activity  . Alcohol use: Yes    Frequency: Never    Comment: occ  . Drug use: No  . Sexual activity: Yes    Birth control/protection: Condom  Lifestyle  . Physical activity    Days per week: 0 days    Minutes per session: 0 min  . Stress: Not at all  Relationships  . Social connections  Talks on phone: More than three times a week    Gets together: Once a week    Attends religious service: Never    Active member of club or organization: Yes    Attends meetings of clubs or organizations: 1 to 4 times per year    Relationship status: Never married  . Intimate partner violence    Fear of current or ex partner: No    Emotionally abused: No    Physically abused: No    Forced sexual activity: No  Other Topics Concern  . Not on file  Social History Narrative  . Not on file     Current Outpatient Medications:  .  albuterol (VENTOLIN HFA) 108 (90 Base) MCG/ACT inhaler, Inhale 2 puffs into the lungs every 6 (six) hours as needed for wheezing or shortness of breath., Disp: 18 g, Rfl: 1 .  metroNIDAZOLE (FLAGYL) 500 MG tablet, Take 1 tablet (500 mg total) by mouth 2 (two) times daily  with a meal. DO NOT CONSUME ALCOHOL WHILE TAKING THIS MEDICATION., Disp: 14 tablet, Rfl: 0  EXAM:  VITALS per patient if applicable: BP 111/71 Comment: taken @ [facility]  Pulse 81   Temp 97.7 F (36.5 C) (Oral) Comment: reported from [facility].  Ht 4\' 11"  (1.499 m)   Wt 115 lb (52.2 kg) Comment: reported @ [facility]  LMP 01/08/2019 (Exact Date)   BMI 23.23 kg/m   GENERAL: alert, oriented, appears well and in no acute distress  HEENT: atraumatic, conjunttiva clear, no obvious abnormalities on inspection of external nose and ears  NECK: normal movements of the head and neck  LUNGS: on inspection no signs of respiratory distress, breathing rate appears normal, no obvious gross SOB, gasping or wheezing  CV: no obvious cyanosis  MS: moves all visible extremities without noticeable abnormality  PSYCH/NEURO: pleasant and cooperative, no obvious depression or anxiety, speech and thought processing grossly intact  ASSESSMENT AND PLAN:  Discussed the following assessment and plan:  Asthma -Renew albuterol inhaler  -suspect allergy component as well, referral placed to allergist for allergy testing.        I discussed the assessment and treatment plan with the patient. The patient was provided an opportunity to ask questions and all were answered. The patient agreed with the plan and demonstrated an understanding of the instructions.   The patient was advised to call back or seek an in-person evaluation if the symptoms worsen or if the condition fails to improve as anticipated.     Everrett Coombeody Jazzlyn Huizenga, DO

## 2019-01-15 NOTE — Progress Notes (Signed)
Pt scheduled  

## 2019-01-16 ENCOUNTER — Ambulatory Visit: Payer: BC Managed Care – PPO | Admitting: Family Medicine

## 2019-01-22 ENCOUNTER — Ambulatory Visit: Payer: BC Managed Care – PPO | Admitting: Allergy and Immunology

## 2019-01-22 ENCOUNTER — Other Ambulatory Visit: Payer: Self-pay

## 2019-01-22 ENCOUNTER — Encounter: Payer: Self-pay | Admitting: Allergy and Immunology

## 2019-01-22 VITALS — BP 126/68 | HR 96 | Temp 98.3°F | Resp 16 | Ht 58.66 in | Wt 112.2 lb

## 2019-01-22 DIAGNOSIS — J4530 Mild persistent asthma, uncomplicated: Secondary | ICD-10-CM

## 2019-01-22 DIAGNOSIS — J3089 Other allergic rhinitis: Secondary | ICD-10-CM | POA: Diagnosis not present

## 2019-01-22 DIAGNOSIS — T7800XD Anaphylactic reaction due to unspecified food, subsequent encounter: Secondary | ICD-10-CM | POA: Diagnosis not present

## 2019-01-22 DIAGNOSIS — T7800XA Anaphylactic reaction due to unspecified food, initial encounter: Secondary | ICD-10-CM | POA: Insufficient documentation

## 2019-01-22 MED ORDER — LEVOCETIRIZINE DIHYDROCHLORIDE 5 MG PO TABS: 5.0000 mg | ORAL_TABLET | Freq: Every day | ORAL | 5 refills | Status: DC | PRN

## 2019-01-22 MED ORDER — ALBUTEROL SULFATE HFA 108 (90 BASE) MCG/ACT IN AERS: INHALATION_SPRAY | RESPIRATORY_TRACT | 1 refills | Status: DC

## 2019-01-22 MED ORDER — EPINEPHRINE 0.3 MG/0.3ML IJ SOAJ: INTRAMUSCULAR | 3 refills | Status: DC

## 2019-01-22 MED ORDER — FLUTICASONE PROPIONATE 50 MCG/ACT NA SUSP: 2.0000 | Freq: Every day | NASAL | 5 refills | Status: DC | PRN

## 2019-01-22 MED ORDER — FLOVENT HFA 110 MCG/ACT IN AERO: 2.0000 | INHALATION_SPRAY | Freq: Two times a day (BID) | RESPIRATORY_TRACT | 5 refills | Status: DC

## 2019-01-22 NOTE — Assessment & Plan Note (Signed)
The patient's history suggests food allergy and positive skin test results today confirm this diagnosis.  Meticulous avoidance of banana as discussed.  A prescription has been provided for epinephrine auto-injector 2 pack along with instructions for proper administration.  A food allergy action plan has been provided and discussed.  Medic Alert identification is recommended.

## 2019-01-22 NOTE — Patient Instructions (Addendum)
Mild persistent asthma  A prescription has been provided for Flovent (fluticasone) 110 g, 2 inhalations twice a day. To maximize pulmonary deposition, a spacer has been provided along with instructions for its proper administration with an HFA inhaler.  During respiratory tract infections or asthma flares, increase Flovent 110g to 3 inhalations via spacer device 3 times per day until symptoms have returned to baseline.  Continue albuterol HFA, 1 to 2 inhalations every 4-6 hours if needed.  Subjective and objective measures of pulmonary function will be followed and the treatment plan will be adjusted accordingly.  Seasonal and perennial allergic rhinitis  Aeroallergen avoidance measures have been discussed and provided in written form.  A prescription has been provided for levocetirizine, 5 mg daily as needed.  A prescription has been provided for fluticasone nasal spray, 2 sprays per nostril daily as needed. Proper nasal spray technique has been discussed and demonstrated.  Nasal saline spray (i.e., Simply Saline) or nasal saline lavage (i.e., NeilMed) is recommended as needed and prior to medicated nasal sprays.  If allergen avoidance measures and medications fail to adequately relieve symptoms, aeroallergen immunotherapy will be considered.  Food allergy The patient's history suggests food allergy and positive skin test results today confirm this diagnosis.  Meticulous avoidance of banana as discussed.  A prescription has been provided for epinephrine auto-injector 2 pack along with instructions for proper administration.  A food allergy action plan has been provided and discussed.  Medic Alert identification is recommended.   Return in about 3 months (around 04/24/2019), or if symptoms worsen or fail to improve.  Control of Dust Mite Allergen  House dust mites play a major role in allergic asthma and rhinitis.  They occur in environments with high humidity wherever human  skin, the food for dust mites is found. High levels have been detected in dust obtained from mattresses, pillows, carpets, upholstered furniture, bed covers, clothes and soft toys.  The principal allergen of the house dust mite is found in its feces.  A gram of dust may contain 1,000 mites and 250,000 fecal particles.  Mite antigen is easily measured in the air during house cleaning activities.    1. Encase mattresses, including the box spring, and pillow, in an air tight cover.  Seal the zipper end of the encased mattresses with wide adhesive tape. 2. Wash the bedding in water of 130 degrees Farenheit weekly.  Avoid cotton comforters/quilts and flannel bedding: the most ideal bed covering is the dacron comforter. 3. Remove all upholstered furniture from the bedroom. 4. Remove carpets, carpet padding, rugs, and non-washable window drapes from the bedroom.  Wash drapes weekly or use plastic window coverings. 5. Remove all non-washable stuffed toys from the bedroom.  Wash stuffed toys weekly. 6. Have the room cleaned frequently with a vacuum cleaner and a damp dust-mop.  The patient should not be in a room which is being cleaned and should wait 1 hour after cleaning before going into the room. 7. Close and seal all heating outlets in the bedroom.  Otherwise, the room will become filled with dust-laden air.  An electric heater can be used to heat the room. Reduce indoor humidity to less than 50%.  Do not use a humidifier.   Reducing Pollen Exposure  The American Academy of Allergy, Asthma and Immunology suggests the following steps to reduce your exposure to pollen during allergy seasons.    1. Do not hang sheets or clothing out to dry; pollen may collect on these items. 2.  Do not mow lawns or spend time around freshly cut grass; mowing stirs up pollen. 3. Keep windows closed at night.  Keep car windows closed while driving. 4. Minimize morning activities outdoors, a time when pollen counts are  usually at their highest. 5. Stay indoors as much as possible when pollen counts or humidity is high and on windy days when pollen tends to remain in the air longer. 6. Use air conditioning when possible.  Many air conditioners have filters that trap the pollen spores. 7. Use a HEPA room air filter to remove pollen form the indoor air you breathe.   Control of Dog or Cat Allergen  Avoidance is the best way to manage a dog or cat allergy. If you have a dog or cat and are allergic to dog or cats, consider removing the dog or cat from the home. If you have a dog or cat but don't want to find it a new home, or if your family wants a pet even though someone in the household is allergic, here are some strategies that may help keep symptoms at bay:  1. Keep the pet out of your bedroom and restrict it to only a few rooms. Be advised that keeping the dog or cat in only one room will not limit the allergens to that room. 2. Don't pet, hug or kiss the dog or cat; if you do, wash your hands with soap and water. 3. High-efficiency particulate air (HEPA) cleaners run continuously in a bedroom or living room can reduce allergen levels over time. 4. Place electrostatic material sheet in the air inlet vent in the bedroom. 5. Regular use of a high-efficiency vacuum cleaner or a central vacuum can reduce allergen levels. 6. Giving your dog or cat a bath at least once a week can reduce airborne allergen.   Control of Mold Allergen  Mold and fungi can grow on a variety of surfaces provided certain temperature and moisture conditions exist.  Outdoor molds grow on plants, decaying vegetation and soil.  The major outdoor mold, Alternaria and Cladosporium, are found in very high numbers during hot and dry conditions.  Generally, a late Summer - Fall peak is seen for common outdoor fungal spores.  Rain will temporarily lower outdoor mold spore count, but counts rise rapidly when the rainy period ends.  The most important  indoor molds are Aspergillus and Penicillium.  Dark, humid and poorly ventilated basements are ideal sites for mold growth.  The next most common sites of mold growth are the bathroom and the kitchen.  Outdoor [employer] 1. Use air conditioning and keep windows closed 2. Avoid exposure to decaying vegetation. 3. Avoid leaf raking. 4. Avoid grain handling. 5. Consider wearing a face mask if working in moldy areas.  Indoor Mold Control 1. Maintain humidity below 50%. 2. Clean washable surfaces with 5% bleach solution. 3. Remove sources e.g. Contaminated carpets.   Control of Cockroach Allergen  Cockroach allergen has been identified as an important cause of acute attacks of asthma, especially in urban settings.  There are fifty-five species of cockroach that exist in the [place], however only three, the [place], [place] species produce allergen that can affect patients with Asthma.  Allergens can be obtained from fecal particles, egg casings and secretions from cockroaches.    1. Remove food sources. 2. Reduce access to water. 3. Seal access and entry points. 4. Spray runways with 0.5-1% Diazinon or Chlorpyrifos 5. Blow boric acid power under stoves  and refrigerator. 6. Place bait stations (hydramethylnon) at feeding sites.

## 2019-01-22 NOTE — Assessment & Plan Note (Addendum)
   Aeroallergen avoidance measures have been discussed and provided in written form.  A prescription has been provided for levocetirizine, 5 mg daily as needed.  A prescription has been provided for fluticasone nasal spray, 2 sprays per nostril daily as needed. Proper nasal spray technique has been discussed and demonstrated.  Nasal saline spray (i.e., Simply Saline) or nasal saline lavage (i.e., NeilMed) is recommended as needed and prior to medicated nasal sprays.  If allergen avoidance measures and medications fail to adequately relieve symptoms, aeroallergen immunotherapy will be considered. 

## 2019-01-22 NOTE — Progress Notes (Signed)
New Patient Note  RE: Tina [name] MRN: [id number] DOB: [birth year] Date of Office Visit: 01/22/2019  Referring provider: Everrett CoombeMatthews, Cody, DO Primary care provider: Everrett CoombeMatthews, Cody, DO  Chief Complaint: Asthma and Allergic Reaction  History of present illness: Tina [name] is a 33 y.o. female seen today in consultation requested by Everrett Coombeody Matthews, DO.  She reports that she has had asthma since early childhood with symptoms consisting of coughing, wheezing, chest tightness, and dyspnea.  These symptoms began to improve over the past 4 to 5 years, however recently she has begun to experience asthma symptoms twice a day on average.  In the past, her asthma symptoms were triggered by dust, extremes of temperature, vigorous exertion, and upper respiratory tract infections.  Recently, her asthma has been triggered when she first wakes up in the morning and when she takes her dog outside for a walk.  She was prescribed an albuterol rescue inhaler yesterday.  In the past she had been on montelukast, however she is currently not on a controller medication.  Tina [name] experiences sneezing, rhinorrhea, and postnasal drainage.  No significant seasonal symptom variation has been noted nor have specific environmental triggers been identified.  She had taken loratadine/pseudoephedrine and fluticasone nasal spray to control these symptoms in the past, however is currently not taking allergy medications. She also reports that in 2009 she consumed a banana and felt like her "throat was closing."  She has not consumed banana since that time.  In addition, when she reports that the consumption of dairy products and/or eggs causes coughing.  She does not experience concomitant urticaria, angioedema, or GI symptoms.  Assessment and plan: Mild persistent asthma  A prescription has been provided for Flovent (fluticasone) 110 g, 2 inhalations twice a day. To maximize pulmonary deposition, a spacer has been provided along  with instructions for its proper administration with an HFA inhaler.  During respiratory tract infections or asthma flares, increase Flovent 110g to 3 inhalations via spacer device 3 times per day until symptoms have returned to baseline.  Continue albuterol HFA, 1 to 2 inhalations every 4-6 hours if needed.  Subjective and objective measures of pulmonary function will be followed and the treatment plan will be adjusted accordingly.  Seasonal and perennial allergic rhinitis  Aeroallergen avoidance measures have been discussed and provided in written form.  A prescription has been provided for levocetirizine, 5 mg daily as needed.  A prescription has been provided for fluticasone nasal spray, 2 sprays per nostril daily as needed. Proper nasal spray technique has been discussed and demonstrated.  Nasal saline spray (i.e., Simply Saline) or nasal saline lavage (i.e., NeilMed) is recommended as needed and prior to medicated nasal sprays.  If allergen avoidance measures and medications fail to adequately relieve symptoms, aeroallergen immunotherapy will be considered.  Food allergy The patient's history suggests food allergy and positive skin test results today confirm this diagnosis.  Meticulous avoidance of banana as discussed.  A prescription has been provided for epinephrine auto-injector 2 pack along with instructions for proper administration.  A food allergy action plan has been provided and discussed.  Medic Alert identification is recommended.   Meds ordered this encounter  Medications  . fluticasone (FLOVENT HFA) 110 MCG/ACT inhaler    Sig: Inhale 2 puffs into the lungs 2 (two) times a day. Rinse, gargle and spit out after use    Dispense:  1 Inhaler    Refill:  5  . albuterol (VENTOLIN HFA) 108 (90 Base) MCG/ACT inhaler  Sig: 2 puffs every 4-6 hours as needed for coughing or wheezing spells    Dispense:  18 g    Refill:  1  . levocetirizine (XYZAL) 5 MG tablet     Sig: Take 1 tablet (5 mg total) by mouth daily as needed for allergies.    Dispense:  30 tablet    Refill:  5  . fluticasone (FLONASE) 50 MCG/ACT nasal spray    Sig: Place 2 sprays into both nostrils daily as needed for allergies or rhinitis.    Dispense:  16 g    Refill:  5  . EPINEPHrine (AUVI-Q) 0.3 mg/0.3 mL IJ SOAJ injection    Sig: Use as directed for severe allergic reactions    Dispense:  4 each    Refill:  3    Diagnostics: Spirometry: FVC was 2.52 L and FEV1 was 2.09 L (91% predicted) with 90 mL postbronchodilator improvement.  This study was performed while the patient was asymptomatic.  Please see scanned spirometry results for details. Environmental skin testing: Positive to grass pollen, weed pollen, ragweed pollen, tree pollen, molds, cat hair, dog epithelia, cockroach antigen, and dust mite antigen. Food allergen skin testing: Positive to banana.    Physical examination: Blood pressure 126/68, pulse 96, temperature 98.3 F (36.8 C), temperature source Oral, resp. rate 16, height 4' 10.66" (1.49 m), weight 112 lb 3.4 oz (50.9 kg), last menstrual period 01/08/2019, SpO2 98 %.  General: Alert, interactive, in no acute distress. HEENT: TMs pearly gray, turbinates edematous and pale without discharge, post-pharynx mildly erythematous. Neck: Supple without lymphadenopathy. Lungs: Clear to auscultation without wheezing, rhonchi or rales. CV: Normal S1, S2 without murmurs. Abdomen: Nondistended, nontender. Skin: Warm and dry, without lesions or rashes. Extremities:  No clubbing, cyanosis or edema. Neuro:   Grossly intact.  Review of systems:  Review of systems negative except as noted in HPI / PMHx or noted below: Review of Systems  Constitutional: Negative.   HENT: Negative.   Eyes: Negative.   Respiratory: Negative.   Cardiovascular: Negative.   Gastrointestinal: Negative.   Genitourinary: Negative.   Musculoskeletal: Negative.   Skin: Negative.   Neurological:  Negative.   Endo/Heme/Allergies: Negative.   Psychiatric/Behavioral: Negative.     Past medical history:  Past Medical History:  Diagnosis Date  . Allergy   . Asthma   . Heart murmur     Past surgical history:  Past Surgical History:  Procedure Laterality Date  . BREAST EXCISIONAL BIOPSY Left 2007  . COLPOSCOPY    . LAPAROSCOPY    . WISDOM TOOTH EXTRACTION      Family history: Family History  Problem Relation Age of Onset  . Diabetes Mother   . Hypertension Father   . Asthma Father   . Cancer Maternal Aunt        colon  . Lupus Maternal Aunt   . Breast cancer Maternal Grandmother   . Breast cancer Paternal Grandmother   . Hypertension Maternal Aunt   . Diabetes Maternal Aunt   . Allergic rhinitis Sister   . Food Allergy Sister   . Eczema Sister   . Asthma Maternal Uncle     Social history: Social History   Socioeconomic History  . Marital status: Single    Spouse name: Not on file  . Number of children: Not on file  . Years of education: Not on file  . Highest education level: Not on file  Occupational History  . Not on file  Social Needs  .  Financial resource strain: Not on file  . Food insecurity    Worry: Not on file    Inability: Not on file  . Transportation needs    Medical: Not on file    Non-medical: Not on file  Tobacco Use  . Smoking status: Former Smoker    Types: E-cigarettes  . Smokeless tobacco: Never Used  Substance and Sexual Activity  . Alcohol use: Yes    Frequency: Never    Comment: occ  . Drug use: No  . Sexual activity: Yes    Birth control/protection: Condom  Lifestyle  . Physical activity    Days per week: 0 days    Minutes per session: 0 min  . Stress: Not at all  Relationships  . Social connections    Talks on phone: More than three times a week    Gets together: Once a week    Attends religious service: Never    Active member of club or organization: Yes    Attends meetings of clubs or organizations: 1 to 4  times per year    Relationship status: Never married  . Intimate partner violence    Fear of current or ex partner: No    Emotionally abused: No    Physically abused: No    Forced sexual activity: No  Other Topics Concern  . Not on file  Social History Narrative  . Not on file   Environmental History: The patient lives in a 32 year old apartment with carpeting throughout and central air/heat.  There is no known mold/water damage in the home.  She has a dog in the home which has access to her bedroom.  She is a non-smoker. She smoked e-cigarettes in the past.  Allergies as of 01/22/2019      Reactions   Banana Other (See Comments)   Throat swelling   Benadryl [diphenhydramine Hcl]       Medication List       Accurate as of January 22, 2019 12:35 PM. If you have any questions, ask your nurse or doctor.        STOP taking these medications   metroNIDAZOLE 500 MG tablet Commonly known as: Flagyl Stopped by: Wellington Hampshire Carter Avannah Decker, MD     TAKE these medications   albuterol 108 (90 Base) MCG/ACT inhaler Commonly known as: VENTOLIN HFA Inhale 2 puffs into the lungs every 6 (six) hours as needed for wheezing or shortness of breath. What changed: Another medication with the same name was added. Make sure you understand how and when to take each. Changed by: Wellington Hampshire Carter Aariel Ems, MD   albuterol 108 (90 Base) MCG/ACT inhaler Commonly known as: VENTOLIN HFA 2 puffs every 4-6 hours as needed for coughing or wheezing spells What changed: You were already taking a medication with the same name, and this prescription was added. Make sure you understand how and when to take each. Changed by: Wellington Hampshire Carter Sutton Hirsch, MD   EPINEPHrine 0.3 mg/0.3 mL Soaj injection Commonly known as: Auvi-Q Use as directed for severe allergic reactions Started by: Wellington Hampshire Carter Tennie Grussing, MD   Flovent HFA 110 MCG/ACT inhaler Generic drug: fluticasone Inhale 2 puffs into the lungs 2 (two) times a day. Rinse, gargle and spit out  after use Started by: Wellington Hampshire Carter Landa Mullinax, MD   fluticasone 50 MCG/ACT nasal spray Commonly known as: Flonase Place 2 sprays into both nostrils daily as needed for allergies or rhinitis. Started by: Wellington Hampshire Carter Curby Carswell, MD   levocetirizine 5 MG tablet Commonly  known as: XYZAL Take 1 tablet (5 mg total) by mouth daily as needed for allergies. Started by: Wellington Hampshire Carter Artavis Cowie, MD       Known medication allergies: Allergies  Allergen Reactions  . Banana Other (See Comments)    Throat swelling  . Benadryl [Diphenhydramine Hcl]     I appreciate the opportunity to take part in Maddox's care. Please do not hesitate to contact me with questions.  Sincerely,   R. Jorene Guestarter Rieley Hausman, MD

## 2019-01-22 NOTE — Assessment & Plan Note (Signed)
   A prescription has been provided for Flovent (fluticasone) 110 g, 2 inhalations twice a day. To maximize pulmonary deposition, a spacer has been provided along with instructions for its proper administration with an HFA inhaler.  During respiratory tract infections or asthma flares, increase Flovent 110g to 3 inhalations via spacer device 3 times per day until symptoms have returned to baseline.  Continue albuterol HFA, 1 to 2 inhalations every 4-6 hours if needed.  Subjective and objective measures of pulmonary function will be followed and the treatment plan will be adjusted accordingly.

## 2019-02-03 ENCOUNTER — Telehealth: Payer: Self-pay

## 2019-02-03 NOTE — Telephone Encounter (Signed)
Questions for Screening COVID-19  Symptom onset: None, Prescreened and #[id number] for arrival   Travel or Contacts: none During this illness, did/does the patient experience any of the following symptoms? Fever >100.65F []   Yes [x]   No []   Unknown Subjective fever (felt feverish) []   Yes [x]   No []   Unknown Chills []   Yes [x]   No []   Unknown Muscle aches (myalgia) []   Yes [x]   No []   Unknown Runny nose (rhinorrhea) []   Yes [x]   No []   Unknown Sore throat []   Yes [x]   No []   Unknown Cough (new onset or worsening of chronic cough) []   Yes [x]   No []   Unknown Shortness of breath (dyspnea) []   Yes [x]   No []   Unknown Nausea or vomiting []   Yes [x]   No []   Unknown Headache []   Yes []   No [x]   Unknown Abdominal pain  []   Yes [x]   No []   Unknown Diarrhea (?3 loose/looser than normal stools/24hr period) []   Yes [x]   No []   Unknown Other, specify:  Patient risk factors: Smoker? []   Current []   Former [x]   Never If female, currently pregnant? []   Yes [x]   No  Patient Active Problem List   Diagnosis Date Noted  . Seasonal and perennial allergic rhinitis 01/22/2019  . Food allergy 01/22/2019  . Mild persistent asthma 01/14/2019    Plan:  []   High risk for COVID-19 with red flags go to ED (with CP, SOB, weak/lightheaded, or fever > 101.5). Call ahead.  []   High risk for COVID-19 but stable. Inform provider and coordinate time for [facility] visit.   [x]   No red flags but URI signs or symptoms okay for [facility] visit.

## 2019-02-04 ENCOUNTER — Encounter: Payer: Self-pay | Admitting: Family Medicine

## 2019-02-04 ENCOUNTER — Ambulatory Visit (INDEPENDENT_AMBULATORY_CARE_PROVIDER_SITE_OTHER): Payer: BC Managed Care – PPO | Admitting: Family Medicine

## 2019-02-04 VITALS — BP 102/68 | HR 78 | Temp 98.3°F | Resp 16 | Ht <= 58 in | Wt 117.4 lb

## 2019-02-04 DIAGNOSIS — Z0000 Encounter for general adult medical examination without abnormal findings: Secondary | ICD-10-CM

## 2019-02-04 DIAGNOSIS — Z13220 Encounter for screening for lipoid disorders: Secondary | ICD-10-CM | POA: Diagnosis not present

## 2019-02-04 LAB — CBC WITH DIFFERENTIAL/PLATELET
Basophils Absolute: 0 10*3/uL (ref 0.0–0.1)
Basophils Relative: 0.5 % (ref 0.0–3.0)
Eosinophils Absolute: 0.2 10*3/uL (ref 0.0–0.7)
Eosinophils Relative: 4.5 % (ref 0.0–5.0)
HCT: 32.6 % — ABNORMAL LOW (ref 36.0–46.0)
Hemoglobin: 10.8 g/dL — ABNORMAL LOW (ref 12.0–15.0)
Lymphocytes Relative: 29.5 % (ref 12.0–46.0)
Lymphs Abs: 1.5 10*3/uL (ref 0.7–4.0)
MCHC: 33.1 g/dL (ref 30.0–36.0)
MCV: 92.4 fl (ref 78.0–100.0)
Monocytes Absolute: 0.4 10*3/uL (ref 0.1–1.0)
Monocytes Relative: 8.8 % (ref 3.0–12.0)
Neutro Abs: 2.9 10*3/uL (ref 1.4–7.7)
Neutrophils Relative %: 56.7 % (ref 43.0–77.0)
Platelets: 284 10*3/uL (ref 150.0–400.0)
RBC: 3.53 Mil/uL — ABNORMAL LOW (ref 3.87–5.11)
RDW: 13.3 % (ref 11.5–15.5)
WBC: 5 10*3/uL (ref 4.0–10.5)

## 2019-02-04 LAB — COMPREHENSIVE METABOLIC PANEL
ALT: 10 U/L (ref 0–35)
AST: 16 U/L (ref 0–37)
Albumin: 4.1 g/dL (ref 3.5–5.2)
Alkaline Phosphatase: 33 U/L — ABNORMAL LOW (ref 39–117)
BUN: 7 mg/dL (ref 6–23)
CO2: 27 mEq/L (ref 19–32)
Calcium: 9.2 mg/dL (ref 8.4–10.5)
Chloride: 105 mEq/L (ref 96–112)
Creatinine, Ser: 0.61 mg/dL (ref 0.40–1.20)
GFR: 137.58 mL/min (ref >60.00)
Glucose, Bld: 94 mg/dL (ref 70–99)
Potassium: 4.1 mEq/L (ref 3.5–5.1)
Sodium: 137 mEq/L (ref 135–145)
Total Bilirubin: 0.4 mg/dL (ref 0.2–1.2)
Total Protein: 6.9 g/dL (ref 6.0–8.3)

## 2019-02-04 LAB — LIPID PANEL
Cholesterol: 135 mg/dL (ref 0–200)
HDL: 51 mg/dL (ref >39.00)
LDL Cholesterol: 76 mg/dL (ref 0–99)
NonHDL: 84.18
Total CHOL/HDL Ratio: 3
Triglycerides: 42 mg/dL (ref 0.0–149.0)
VLDL: 8.4 mg/dL (ref 0.0–40.0)

## 2019-02-04 LAB — TSH: TSH: 3.23 u[IU]/mL (ref 0.35–4.50)

## 2019-02-04 NOTE — Assessment & Plan Note (Signed)
Well adult Submandibular area likely a cyst, will monitor routinely  Orders Placed This Encounter  Procedures  . Comp Met (CMET)  . CBC w/Diff  . TSH  . Lipid Profile  Screenings:  Lipid profile Immunizations: UTD Anticipatory guidance/Risk factor reduction:  Recommendations per AVS

## 2019-02-04 NOTE — Progress Notes (Signed)
Tina [name] - 33 y.o. female MRN [id number]  Date of birth: 1986/03/26  Subjective Chief Complaint  Patient presents with  . Annual Exam    CPE/Labs, Lump under chin- sonset yrs,     HPI Tina [name] is a 33 y.o. female here today for annual exam.  She has been in relatively good health and was recently evaluated by an allergist for chronic allergies and asthma.  Her symptoms are well controlled at this time.  She has a small lump under her chin that has been present for years, doesn't think it has really changed in size.  She denies pain with this area.   She follows a healthy diet.  She is a non-smoker.    Review of Systems  Constitutional: Negative for chills, fever, malaise/fatigue and weight loss.  HENT: Negative for congestion, ear pain and sore throat.   Eyes: Negative for blurred vision, double vision and pain.  Respiratory: Negative for cough and shortness of breath.   Cardiovascular: Negative for chest pain and palpitations.  Gastrointestinal: Negative for abdominal pain, blood in stool, constipation, heartburn and nausea.  Genitourinary: Negative for dysuria and urgency.  Musculoskeletal: Negative for joint pain and myalgias.  Neurological: Negative for dizziness and headaches.  Endo/Heme/Allergies: Does not bruise/bleed easily.  Psychiatric/Behavioral: Negative for depression. The patient is not nervous/anxious and does not have insomnia.     Allergies  Allergen Reactions  . Banana Other (See Comments)    Throat swelling  . Benadryl [Diphenhydramine Hcl]     Past Medical History:  Diagnosis Date  . Allergy   . Asthma   . Heart murmur     Past Surgical History:  Procedure Laterality Date  . BREAST EXCISIONAL BIOPSY Left 2007  . COLPOSCOPY    . LAPAROSCOPY    . WISDOM TOOTH EXTRACTION      Social History   Socioeconomic History  . Marital status: Single    Spouse name: Not on file  . Number of children: Not on file  . Years of education: Not on file   . Highest education level: Not on file  Occupational History  . Not on file  Social Needs  . Financial resource strain: Not hard at all  . Food insecurity    Worry: Never true    Inability: Never true  . Transportation needs    Medical: No    Non-medical: No  Tobacco Use  . Smoking status: Former Smoker    Types: E-cigarettes  . Smokeless tobacco: Never Used  Substance and Sexual Activity  . Alcohol use: Yes    Frequency: Never    Comment: occ  . Drug use: No  . Sexual activity: Yes    Birth control/protection: Condom  Lifestyle  . Physical activity    Days per week: 0 days    Minutes per session: 0 min  . Stress: Not at all  Relationships  . Social connections    Talks on phone: More than three times a week    Gets together: Once a week    Attends religious service: Never    Active member of club or organization: Yes    Attends meetings of clubs or organizations: 1 to 4 times per year    Relationship status: Never married  Other Topics Concern  . Not on file  Social History Narrative  . Not on file    Family History  Problem Relation Age of Onset  . Diabetes Mother   . Hypertension Father   .  Asthma Father   . Diabetes Father   . Cancer Maternal Aunt        colon  . Lupus Maternal Aunt   . Diabetes Maternal Aunt   . Breast cancer Maternal Grandmother   . Breast cancer Paternal Grandmother   . Diabetes Paternal Grandmother   . Hypertension Maternal Aunt   . Diabetes Maternal Aunt   . Diabetes Maternal Aunt   . Allergic rhinitis Sister   . Food Allergy Sister   . Eczema Sister   . Asthma Maternal Uncle   . Diabetes Paternal Grandfather     Health Maintenance  Topic Date Due  . HIV Screening  02/21/2002  . TETANUS/TDAP  02/21/2006  . PAP SMEAR-Modifier  02/22/2008  . INFLUENZA VACCINE  02/08/2019     ----------------------------------------------------------------------------------------------------------------------------------------------------------------------------------------------------------------- Physical Exam BP 102/68   Pulse 78   Temp 98.3 F (36.8 C) (Oral)   Resp 16   Ht 4' 3.98" (1.32 m)   Wt 117 lb 6.4 oz (53.3 kg)   LMP 01/08/2019 (Exact Date)   SpO2 99%   BMI 30.55 kg/m   Physical Exam Constitutional:      General: She is not in acute distress. HENT:     Head: Normocephalic and atraumatic.     Nose: Nose normal.  Eyes:     General: No scleral icterus.    Conjunctiva/sclera: Conjunctivae normal.  Neck:     Musculoskeletal: Normal range of motion and neck supple.     Thyroid: No thyromegaly.  Cardiovascular:     Rate and Rhythm: Normal rate and regular rhythm.     Heart sounds: Normal heart sounds.  Pulmonary:     Effort: Pulmonary effort is normal.     Breath sounds: Normal breath sounds.  Abdominal:     General: Bowel sounds are normal. There is no distension.     Palpations: Abdomen is soft.     Tenderness: There is no abdominal tenderness. There is no guarding.  Musculoskeletal: Normal range of motion.  Lymphadenopathy:     Cervical: No cervical adenopathy.  Skin:    General: Skin is warm and dry.     Findings: No rash.     Comments: Small, freely mobile submandibular nodule.   Neurological:     Mental Status: She is alert and oriented to person, place, and time.     Cranial Nerves: No cranial nerve deficit.     Coordination: Coordination normal.  Psychiatric:        Behavior: Behavior normal.     ------------------------------------------------------------------------------------------------------------------------------------------------------------------------------------------------------------------- Assessment and Plan  Well adult exam Well adult Submandibular area likely a cyst, will monitor routinely  Orders Placed This  Encounter  Procedures  . Comp Met (CMET)  . CBC w/Diff  . TSH  . Lipid Profile  Screenings:  Lipid profile Immunizations: UTD Anticipatory guidance/Risk factor reduction:  Recommendations per AVS

## 2019-02-04 NOTE — Patient Instructions (Signed)
Preventive Care 21-32 Years Old, Female Preventive care refers to visits with your health care provider and lifestyle choices that can promote health and wellness. This includes:  A yearly physical exam. This may also be called an annual well check.  Regular dental visits and eye exams.  Immunizations.  Screening for certain conditions.  Healthy lifestyle choices, such as eating a healthy diet, getting regular exercise, not using drugs or products that contain nicotine and tobacco, and limiting alcohol use. What can I expect for my preventive care visit? Physical exam Your health care provider will check your:  Height and weight. This may be used to calculate body mass index (BMI), which tells if you are at a healthy weight.  Heart rate and blood pressure.  Skin for abnormal spots. Counseling Your health care provider may ask you questions about your:  Alcohol, tobacco, and drug use.  Emotional well-being.  Home and relationship well-being.  Sexual activity.  Eating habits.  Work and work environment.  Method of birth control.  Menstrual cycle.  Pregnancy history. What immunizations do I need?  Influenza (flu) vaccine  This is recommended every year. Tetanus, diphtheria, and pertussis (Tdap) vaccine  You may need a Td booster every 10 years. Varicella (chickenpox) vaccine  You may need this if you have not been vaccinated. Human papillomavirus (HPV) vaccine  If recommended by your health care provider, you may need three doses over 6 months. Measles, mumps, and rubella (MMR) vaccine  You may need at least one dose of MMR. You may also need a second dose. Meningococcal conjugate (MenACWY) vaccine  One dose is recommended if you are age 19-21 years and a first-year college student living in a residence hall, or if you have one of several medical conditions. You may also need additional booster doses. Pneumococcal conjugate (PCV13) vaccine  You may need  this if you have certain conditions and were not previously vaccinated. Pneumococcal polysaccharide (PPSV23) vaccine  You may need one or two doses if you smoke cigarettes or if you have certain conditions. Hepatitis A vaccine  You may need this if you have certain conditions or if you travel or work in places where you may be exposed to hepatitis A. Hepatitis B vaccine  You may need this if you have certain conditions or if you travel or work in places where you may be exposed to hepatitis B. Haemophilus influenzae type b (Hib) vaccine  You may need this if you have certain conditions. You may receive vaccines as individual doses or as more than one vaccine together in one shot (combination vaccines). Talk with your health care provider about the risks and benefits of combination vaccines. What tests do I need?  Blood tests  Lipid and cholesterol levels. These may be checked every 5 years starting at age 20.  Hepatitis C test.  Hepatitis B test. Screening  Diabetes screening. This is done by checking your blood sugar (glucose) after you have not eaten for a while (fasting).  Sexually transmitted disease (STD) testing.  BRCA-related cancer screening. This may be done if you have a family history of breast, ovarian, tubal, or peritoneal cancers.  Pelvic exam and Pap test. This may be done every 3 years starting at age 21. Starting at age 30, this may be done every 5 years if you have a Pap test in combination with an HPV test. Talk with your health care provider about your test results, treatment options, and if necessary, the need for more tests.   Follow these instructions at home: Eating and drinking   Eat a diet that includes fresh fruits and vegetables, whole grains, lean protein, and low-fat dairy.  Take vitamin and mineral supplements as recommended by your health care provider.  Do not drink alcohol if: ? Your health care provider tells you not to drink. ? You are  pregnant, may be pregnant, or are planning to become pregnant.  If you drink alcohol: ? Limit how much you have to 0-1 drink a day. ? Be aware of how much alcohol is in your drink. In the [place] one drink equals one 12 oz bottle of beer (355 mL), one 5 oz glass of wine (148 mL), or one 1 oz glass of hard liquor (44 mL). Lifestyle  Take daily care of your teeth and gums.  Stay active. Exercise for at least 30 minutes on 5 or more days each week.  Do not use any products that contain nicotine or tobacco, such as cigarettes, e-cigarettes, and chewing tobacco. If you need help quitting, ask your health care provider.  If you are sexually active, practice safe sex. Use a condom or other form of birth control (contraception) in order to prevent pregnancy and STIs (sexually transmitted infections). If you plan to become pregnant, see your health care provider for a preconception visit. What's next?  Visit your health care provider once a year for a well check visit.  Ask your health care provider how often you should have your eyes and teeth checked.  Stay up to date on all vaccines. This information is not intended to replace advice given to you by your health care provider. Make sure you discuss any questions you have with your health care provider. Document Released: 08/22/2001 Document Revised: 03/07/2018 Document Reviewed: 03/07/2018 Elsevier Patient Education  2020 Elsevier Inc.  

## 2019-02-10 ENCOUNTER — Other Ambulatory Visit: Payer: Self-pay | Admitting: Family Medicine

## 2019-02-10 DIAGNOSIS — D649 Anemia, unspecified: Secondary | ICD-10-CM

## 2019-02-10 NOTE — Progress Notes (Signed)
Recent labs show some mild anemia.  I would recommend that we check an iron panel to investigate this further.  I have entered orders.  Please schedule lab visit.   Other labs are normal.    Thanks!

## 2019-02-11 ENCOUNTER — Telehealth: Payer: Self-pay

## 2019-02-11 NOTE — Telephone Encounter (Signed)

## 2019-02-12 ENCOUNTER — Other Ambulatory Visit (INDEPENDENT_AMBULATORY_CARE_PROVIDER_SITE_OTHER): Payer: BC Managed Care – PPO

## 2019-02-12 DIAGNOSIS — D649 Anemia, unspecified: Secondary | ICD-10-CM | POA: Diagnosis not present

## 2019-02-13 ENCOUNTER — Encounter: Payer: Self-pay | Admitting: *Deleted

## 2019-02-13 ENCOUNTER — Telehealth: Payer: Self-pay | Admitting: Allergy and Immunology

## 2019-02-13 LAB — IRON,TIBC AND FERRITIN PANEL
%SAT: 14 % (calc) — ABNORMAL LOW (ref 16–45)
Ferritin: 11 ng/mL — ABNORMAL LOW (ref 16–154)
Iron: 48 ug/dL (ref 40–190)
TIBC: 345 mcg/dL (calc) (ref 250–450)

## 2019-02-13 NOTE — Telephone Encounter (Signed)
PT called to see if Dr Shaune Leeks can write a Dr note stating her asthma conditions and options of work from home or come in and social distance or fewer than certain number of people in her classroom/area. She works for [employer]. 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  [place] [phone]

## 2019-02-13 NOTE — Telephone Encounter (Signed)
Letter typed waiting for doctor sig.

## 2019-02-13 NOTE — Telephone Encounter (Signed)
Correction [email or web address]

## 2019-02-13 NOTE — Telephone Encounter (Signed)
Pt informed letter is ready- emailed to [email or web address]

## 2019-02-17 ENCOUNTER — Telehealth: Payer: Self-pay | Admitting: Family Medicine

## 2019-02-17 NOTE — Telephone Encounter (Signed)
Pt called and she has been monitoring her oxygen levels and said it was at the lowest it has been at 96%. Dr Zigmund Daniel said that as long as it is above 95% it should be fine unless there were any concerns that she would want to talk to him about, she was fine with that and said she will continue monitoring and call back if neccessary.

## 2019-02-17 NOTE — Telephone Encounter (Signed)
Dr. Shaune Leeks was able to sign letter for her.

## 2019-02-17 NOTE — Telephone Encounter (Signed)
Noted. Thanks.

## 2019-02-17 NOTE — Telephone Encounter (Signed)
Where can I find the letter so I can review and sign it? Thanks.

## 2019-02-19 ENCOUNTER — Telehealth (INDEPENDENT_AMBULATORY_CARE_PROVIDER_SITE_OTHER): Payer: BC Managed Care – PPO | Admitting: Family Medicine

## 2019-02-19 ENCOUNTER — Encounter: Payer: Self-pay | Admitting: Family Medicine

## 2019-02-19 DIAGNOSIS — J45901 Unspecified asthma with (acute) exacerbation: Secondary | ICD-10-CM | POA: Insufficient documentation

## 2019-02-19 DIAGNOSIS — J4531 Mild persistent asthma with (acute) exacerbation: Secondary | ICD-10-CM | POA: Diagnosis not present

## 2019-02-19 MED ORDER — METHYLPREDNISOLONE 4 MG PO TBPK: ORAL_TABLET | ORAL | 0 refills | Status: DC

## 2019-02-19 NOTE — Progress Notes (Signed)
Tina [name] - 33 y.o. female MRN [id number]  Date of birth: 1986/02/16   This visit type was conducted due to national recommendations for restrictions regarding the COVID-19 Pandemic (e.g. social distancing).  This format is felt to be most appropriate for this patient at this time.  All issues noted in this document were discussed and addressed.  No physical exam was performed (except for noted visual exam findings with Video Visits).  I discussed the limitations of evaluation and management by telemedicine and the availability of in person appointments. The patient expressed understanding and agreed to proceed.  I connected with@ on 02/19/19 at  8:00 AM EDT by a video enabled telemedicine application and verified that I am speaking with the correct person using two identifiers.   Patient Location: Home [place] [address]   Provider location:   [facility]  Chief Complaint  Patient presents with  . Asthma    pt states oxygen level got to 96%, slight cough, when inhaling neck retracts/using flovent    HPI  Tina [name] is a 33 y.o. female who presents via audio/video conferencing for a telehealth visit today.  She reports symptoms of asthma flare.  Reports that she has felt shortness of breath with intermittent wheezing.  She has had cough, productive of clear mucus.  She is using flovent but has not tried her albuterol inhaler.  Her O2 sats are hovering around 96%.  She denies fever, chills, headache, body aches, congestion, nausea, vomiting, diarrhea.   ROS:  A comprehensive ROS was completed and negative except as noted per HPI  Past Medical History:  Diagnosis Date  . Allergy   . Asthma   . Heart murmur     Past Surgical History:  Procedure Laterality Date  . BREAST EXCISIONAL BIOPSY Left 2007  . COLPOSCOPY    . LAPAROSCOPY    . WISDOM TOOTH EXTRACTION      Family History  Problem Relation Age of Onset  . Diabetes Mother   .  Hypertension Father   . Asthma Father   . Diabetes Father   . Cancer Maternal Aunt        colon  . Lupus Maternal Aunt   . Diabetes Maternal Aunt   . Breast cancer Maternal Grandmother   . Breast cancer Paternal Grandmother   . Diabetes Paternal Grandmother   . Hypertension Maternal Aunt   . Diabetes Maternal Aunt   . Diabetes Maternal Aunt   . Allergic rhinitis Sister   . Food Allergy Sister   . Eczema Sister   . Asthma Maternal Uncle   . Diabetes Paternal Grandfather     Social History   Socioeconomic History  . Marital status: Single    Spouse name: Not on file  . Number of children: Not on file  . Years of education: Not on file  . Highest education level: Not on file  Occupational History  . Not on file  Social Needs  . Financial resource strain: Not hard at all  . Food insecurity    Worry: Never true    Inability: Never true  . Transportation needs    Medical: No    Non-medical: No  Tobacco Use  . Smoking status: Former Smoker    Types: E-cigarettes  . Smokeless tobacco: Never Used  Substance and Sexual Activity  . Alcohol use: Yes    Frequency: Never    Comment: occ  . Drug use: No  . Sexual activity:  Yes    Birth control/protection: Condom  Lifestyle  . Physical activity    Days per week: 0 days    Minutes per session: 0 min  . Stress: Not at all  Relationships  . Social connections    Talks on phone: More than three times a week    Gets together: Once a week    Attends religious service: Never    Active member of club or organization: Yes    Attends meetings of clubs or organizations: 1 to 4 times per year    Relationship status: Never married  . Intimate partner violence    Fear of current or ex partner: No    Emotionally abused: No    Physically abused: No    Forced sexual activity: No  Other Topics Concern  . Not on file  Social History Narrative  . Not on file     Current Outpatient Medications:  .  albuterol (VENTOLIN HFA) 108  (90 Base) MCG/ACT inhaler, Inhale 2 puffs into the lungs every 6 (six) hours as needed for wheezing or shortness of breath., Disp: 18 g, Rfl: 1 .  albuterol (VENTOLIN HFA) 108 (90 Base) MCG/ACT inhaler, 2 puffs every 4-6 hours as needed for coughing or wheezing spells, Disp: 18 g, Rfl: 1 .  EPINEPHrine (AUVI-Q) 0.3 mg/0.3 mL IJ SOAJ injection, Use as directed for severe allergic reactions, Disp: 4 each, Rfl: 3 .  fluticasone (FLONASE) 50 MCG/ACT nasal spray, Place 2 sprays into both nostrils daily as needed for allergies or rhinitis., Disp: 16 g, Rfl: 5 .  fluticasone (FLOVENT HFA) 110 MCG/ACT inhaler, Inhale 2 puffs into the lungs 2 (two) times a day. Rinse, gargle and spit out after use, Disp: 1 Inhaler, Rfl: 5 .  levocetirizine (XYZAL) 5 MG tablet, Take 1 tablet (5 mg total) by mouth daily as needed for allergies., Disp: 30 tablet, Rfl: 5 .  methylPREDNISolone (MEDROL DOSEPAK) 4 MG TBPK tablet, Take as directed on packaging., Disp: 21 tablet, Rfl: 0  EXAM:  VITALS per patient if applicable: There were no vitals taken for this visit.  GENERAL: alert, oriented, appears well and in no acute distress  HEENT: atraumatic, conjunttiva clear, no obvious abnormalities on inspection of external nose and ears  NECK: normal movements of the head and neck  LUNGS: on inspection no signs of respiratory distress, breathing rate appears normal, no obvious gross SOB, gasping or wheezing  CV: no obvious cyanosis  MS: moves all visible extremities without noticeable abnormality  PSYCH/NEURO: pleasant and cooperative, no obvious depression or anxiety, speech and thought processing grossly intact  ASSESSMENT AND PLAN:  Discussed the following assessment and plan:  Asthma exacerbation -Instructed on albuterol use every 4-6 hours as needed for cough, wheezing or shortness of breath.  -She may continue flovent.  -Add methylprednisolone -No clear indication for antibiotics at this time and low suspicion  for COVID causing exacerbation given absence of other systemic symptoms.  -She was instructed to contact our clinic for worsening symptoms or failure to improve.  She expresses understanding.        I discussed the assessment and treatment plan with the patient. The patient was provided an opportunity to ask questions and all were answered. The patient agreed with the plan and demonstrated an understanding of the instructions.   The patient was advised to call back or seek an in-person evaluation if the symptoms worsen or if the condition fails to improve as anticipated.    Everrett Coombeody Mianna Iezzi, DO

## 2019-02-19 NOTE — Assessment & Plan Note (Signed)
-  Instructed on albuterol use every 4-6 hours as needed for cough, wheezing or shortness of breath.  -She may continue flovent.  -Add methylprednisolone -No clear indication for antibiotics at this time and low suspicion for COVID causing exacerbation given absence of other systemic symptoms.  -She was instructed to contact our clinic for worsening symptoms or failure to improve.  She expresses understanding.

## 2019-04-06 ENCOUNTER — Other Ambulatory Visit: Payer: Self-pay

## 2019-04-06 ENCOUNTER — Emergency Department (HOSPITAL_BASED_OUTPATIENT_CLINIC_OR_DEPARTMENT_OTHER)
Admission: EM | Admit: 2019-04-06 | Discharge: 2019-04-06 | Disposition: A | Discharge status: Home / Self Care | Payer: BC Managed Care – PPO | Attending: Emergency Medicine | Admitting: Emergency Medicine

## 2019-04-06 ENCOUNTER — Emergency Department (HOSPITAL_BASED_OUTPATIENT_CLINIC_OR_DEPARTMENT_OTHER): Payer: BC Managed Care – PPO

## 2019-04-06 ENCOUNTER — Encounter (HOSPITAL_BASED_OUTPATIENT_CLINIC_OR_DEPARTMENT_OTHER): Payer: Self-pay | Admitting: Emergency Medicine

## 2019-04-06 DIAGNOSIS — Z87891 Personal history of nicotine dependence: Secondary | ICD-10-CM | POA: Insufficient documentation

## 2019-04-06 DIAGNOSIS — J4521 Mild intermittent asthma with (acute) exacerbation: Secondary | ICD-10-CM | POA: Insufficient documentation

## 2019-04-06 DIAGNOSIS — Z79899 Other long term (current) drug therapy: Secondary | ICD-10-CM | POA: Insufficient documentation

## 2019-04-06 DIAGNOSIS — R0602 Shortness of breath: Secondary | ICD-10-CM | POA: Diagnosis present

## 2019-04-06 MED ORDER — ALBUTEROL SULFATE HFA 108 (90 BASE) MCG/ACT IN AERS
8.0000 | INHALATION_SPRAY | Freq: Once | RESPIRATORY_TRACT | Status: AC
  Administered 2019-04-06: 20:00:00 8 via RESPIRATORY_TRACT
  Filled 2019-04-06: qty 6.7

## 2019-04-06 NOTE — ED Provider Notes (Signed)
[facility] EMERGENCY DEPARTMENT Provider Note   CSN: [id number] Arrival date & time: 04/06/19  1856     History   Chief Complaint Chief Complaint  Patient presents with  . Shortness of Breath    HPI Tina [name] is a 33 y.o. female.     33 y.o female with a PMH of Asthma presents to the ED with a chief complaint of asthma attack. Patient reports she was at home when she began to feels like " I was breathing very fast and I felt short of breath". She reports calling the nurse line and was referred to be seen in the ED. Patient states she has been using her inhaler as needed, reports she measured her oxygen at home and it was 93%, she then drove to the ED. After arriving to the ED symptoms have resolved. She reports she is unsure "if it was my asthma and anxiety". She denies any fever, chest pain, prior history of blood clots.   The history is provided by the patient.  Shortness of Breath Associated symptoms: no abdominal pain, no chest pain, no fever, no sore throat and no vomiting     Past Medical History:  Diagnosis Date  . Allergy   . Asthma   . Heart murmur     Patient Active Problem List   Diagnosis Date Noted  . Asthma exacerbation 02/19/2019  . Well adult exam 02/04/2019  . Seasonal and perennial allergic rhinitis 01/22/2019  . Food allergy 01/22/2019  . Mild persistent asthma 01/14/2019    Past Surgical History:  Procedure Laterality Date  . BREAST EXCISIONAL BIOPSY Left 2007  . COLPOSCOPY    . LAPAROSCOPY    . WISDOM TOOTH EXTRACTION       OB History    Gravida  0   Para  0   Term  0   Preterm  0   AB  0   Living  0     SAB  0   TAB  0   Ectopic  0   Multiple  0   Live Births  0            Home Medications    Prior to Admission medications   Medication Sig Start Date End Date Taking? Authorizing Provider  albuterol (VENTOLIN HFA) 108 (90 Base) MCG/ACT inhaler Inhale 2 puffs into the lungs every 6 (six) hours as  needed for wheezing or shortness of breath. 01/14/19  Yes Luetta Nutting, DO  fluticasone (FLONASE) 50 MCG/ACT nasal spray Place 2 sprays into both nostrils daily as needed for allergies or rhinitis. 01/22/19  Yes Bobbitt, Sedalia Muta, MD  levocetirizine (XYZAL) 5 MG tablet Take 1 tablet (5 mg total) by mouth daily as needed for allergies. 01/22/19  Yes Bobbitt, Sedalia Muta, MD  albuterol (VENTOLIN HFA) 108 (90 Base) MCG/ACT inhaler 2 puffs every 4-6 hours as needed for coughing or wheezing spells 01/22/19   Bobbitt, Sedalia Muta, MD  EPINEPHrine (AUVI-Q) 0.3 mg/0.3 mL IJ SOAJ injection Use as directed for severe allergic reactions 01/22/19   Bobbitt, Sedalia Muta, MD  fluticasone (FLOVENT HFA) 110 MCG/ACT inhaler Inhale 2 puffs into the lungs 2 (two) times a day. Rinse, gargle and spit out after use 01/22/19   Bobbitt, Sedalia Muta, MD  methylPREDNISolone (MEDROL DOSEPAK) 4 MG TBPK tablet Take as directed on packaging. 02/19/19   Luetta Nutting, DO    Family History Family History  Problem Relation Age of Onset  . Diabetes Mother   .  Hypertension Father   . Asthma Father   . Diabetes Father   . Cancer Maternal Aunt        colon  . Lupus Maternal Aunt   . Diabetes Maternal Aunt   . Breast cancer Maternal Grandmother   . Breast cancer Paternal Grandmother   . Diabetes Paternal Grandmother   . Hypertension Maternal Aunt   . Diabetes Maternal Aunt   . Diabetes Maternal Aunt   . Allergic rhinitis Sister   . Food Allergy Sister   . Eczema Sister   . Asthma Maternal Uncle   . Diabetes Paternal Grandfather     Social History Social History   Tobacco Use  . Smoking status: Former Smoker    Types: E-cigarettes  . Smokeless tobacco: Never Used  Substance Use Topics  . Alcohol use: Yes    Frequency: Never    Comment: occ  . Drug use: No     Allergies   Banana and Benadryl [diphenhydramine hcl]   Review of Systems Review of Systems  Constitutional: Negative for fever.  HENT:  Negative for sore throat.   Respiratory: Positive for shortness of breath.   Cardiovascular: Negative for chest pain.  Gastrointestinal: Negative for abdominal pain, nausea and vomiting.  Genitourinary: Negative for flank pain.  Musculoskeletal: Negative for back pain.  Skin: Negative for pallor and wound.  Neurological: Negative for light-headedness.     Physical Exam Updated Vital Signs BP 140/87 (BP Location: Right Arm)   Pulse 95   Temp 98.7 F (37.1 C) (Oral)   Resp 20   Ht 4\' 11"  (1.499 m)   Wt 49.9 kg   LMP 04/01/2019   SpO2 100%   BMI 22.22 kg/m   Physical Exam Vitals signs and nursing note reviewed.  Constitutional:      General: She is not in acute distress.    Appearance: She is well-developed. She is not ill-appearing.  HENT:     Head: Normocephalic and atraumatic.     Mouth/Throat:     Pharynx: No oropharyngeal exudate.  Eyes:     Pupils: Pupils are equal, round, and reactive to light.  Neck:     Musculoskeletal: Normal range of motion.  Cardiovascular:     Rate and Rhythm: Regular rhythm.     Heart sounds: Murmur present.  Pulmonary:     Effort: Pulmonary effort is normal. No respiratory distress.     Breath sounds: Examination of the right-middle field reveals decreased breath sounds. Examination of the right-lower field reveals decreased breath sounds. Decreased breath sounds present. No wheezing or rhonchi.  Chest:     Chest wall: No tenderness.  Abdominal:     General: Bowel sounds are normal. There is no distension.     Palpations: Abdomen is soft.     Tenderness: There is no abdominal tenderness.  Musculoskeletal:        General: No tenderness or deformity.     Right lower leg: No edema.     Left lower leg: No edema.  Skin:    General: Skin is warm and dry.  Neurological:     Mental Status: She is alert and oriented to person, place, and time.      ED Treatments / Results  Labs (all labs ordered are listed, but only abnormal results  are displayed) Labs Reviewed - No data to display  EKG None  Radiology Dg Chest 2 View  Result Date: 04/06/2019 CLINICAL DATA:  Asthma, short of breath EXAM: CHEST - 2  VIEW COMPARISON:  None. FINDINGS: The heart size and mediastinal contours are within normal limits. Both lungs are clear. Mild scoliosis of the spine. IMPRESSION: No active cardiopulmonary disease. Electronically Signed   By: Jasmine Pang M.D.   On: 04/06/2019 20:46    Procedures Procedures (including critical care time)  Medications Ordered in ED Medications  albuterol (VENTOLIN HFA) 108 (90 Base) MCG/ACT inhaler 8 puff (8 puffs Inhalation Given 04/06/19 1944)     Initial Impression / Assessment and Plan / ED Course  I have reviewed the triage vital signs and the nursing notes.  Pertinent labs & imaging results that were available during my care of the patient were reviewed by me and considered in my medical decision making (see chart for details).        Patient with a past medical history of asthma presents to the ED with a short episode of shortness of breath which occurred today while at home 1 hour prior to arrival.  Patient reports she felt a sudden onset of shortness of breath, reports checking her O2 oxygenation which was 93% on room air. Patient reports she called the nurse line, was directed into the ED.  She reports she is had no fevers, coughs, chest pain, sick contacts.  Has been using her inhaler as prescribed.  Reports she is unsure of what she felt but this subsided after arriving into the ED.  During my evaluation patient's lung sounds are diminished to the right middle and lower lobe.  Left side is without any wheezing inspiratory or expiratory.  She is afebrile, oxygen saturations 100% on room air, no tachypnea present on my exam.  She received 8 puffs of albuterol to help with the diminished lung sounds.  X-ray of her chest appear with no consolidation, pneumothorax, pleural effusion.  Patient  reports improvement in symptoms after albuterol treatment with spacer in the ED.  Vitals are within normal limits, she remains afebrile.  Patient is advised to follow-up with primary care physician as needed.   Risk because distant benefits of steroid burst prior to discharge, shared decision-making conversation with patient who reports she is currently taking a steroid inhaler, she would not like any additional steroids at this time.  She does have good follow-up with her PCP.  Return precautions discussed at length.   Portions of this note were generated with Scientist, clinical (histocompatibility and immunogenetics). Dictation errors may occur despite best attempts at proofreading.  Final Clinical Impressions(s) / ED Diagnoses   Final diagnoses:  Mild intermittent asthma with exacerbation    ED Discharge Orders    None       Claude Manges, PA-C 04/06/19 2100    Pricilla Loveless, MD 04/06/19 2317

## 2019-04-06 NOTE — Discharge Instructions (Addendum)
Please use your inhaler as needed.  You may schedule an appointment with your PCP for reevaluation in the upcoming week.  If you experience any shortness of breath, chest pain, worsening symptoms and return to the emergency department.

## 2019-04-06 NOTE — ED Triage Notes (Signed)
SOB x 1 hour. Hx of asthma.

## 2019-04-07 ENCOUNTER — Ambulatory Visit
Admission: RE | Admit: 2019-04-07 | Discharge: 2019-04-07 | Disposition: A | Discharge status: Home / Self Care | Payer: BC Managed Care – PPO | Source: Ambulatory Visit | Attending: Obstetrics and Gynecology | Admitting: Obstetrics and Gynecology

## 2019-04-07 ENCOUNTER — Other Ambulatory Visit (HOSPITAL_COMMUNITY): Payer: Self-pay | Admitting: Obstetrics and Gynecology

## 2019-04-07 DIAGNOSIS — N63 Unspecified lump in unspecified breast: Secondary | ICD-10-CM

## 2019-04-08 ENCOUNTER — Telehealth: Payer: Self-pay | Admitting: Allergy and Immunology

## 2019-04-08 NOTE — Telephone Encounter (Signed)
Spoke to pt in regards to a scheduling message she sent to schedule a 3 month follow up around 10/15. PT stated she would like to start allergy injections so we scheduled her new start on 10/23 and ov on 11/12. PT was in the ed for asthma flare on 9/27.

## 2019-04-09 NOTE — Progress Notes (Signed)
VIALS EXP 04-08-20

## 2019-04-10 DIAGNOSIS — J301 Allergic rhinitis due to pollen: Secondary | ICD-10-CM

## 2019-04-14 ENCOUNTER — Inpatient Hospital Stay: Payer: BC Managed Care – PPO | Admitting: Family Medicine

## 2019-04-14 DIAGNOSIS — J3089 Other allergic rhinitis: Secondary | ICD-10-CM | POA: Diagnosis not present

## 2019-04-15 ENCOUNTER — Telehealth: Payer: Self-pay

## 2019-04-15 NOTE — Telephone Encounter (Signed)
Questions for Screening COVID-19  Symptom onset: none   Travel or Contacts: none  During this illness, did/does the patient experience any of the following symptoms? Fever >100.4F []  Yes [x]  No []  Unknown Subjective fever (felt feverish) []  Yes [x]  No []  Unknown Chills []  Yes [x]  No []  Unknown Muscle aches (myalgia) []  Yes [x]  No []  Unknown Runny nose (rhinorrhea) []  Yes [x]  No []  Unknown Sore throat []  Yes [x]  No []  Unknown Cough (new onset or worsening of chronic cough) []  Yes [x]  No []  Unknown Shortness of breath (dyspnea) []  Yes [x]  No []  Unknown Nausea or vomiting []  Yes [x]  No []  Unknown Headache []  Yes [x]  No []  Unknown Abdominal pain  []  Yes [x]  No []  Unknown Diarrhea (?3 loose/looser than normal stools/24hr period) []  Yes [x]  No []  Unknown Other, specify:   

## 2019-04-16 ENCOUNTER — Encounter: Payer: Self-pay | Admitting: Family Medicine

## 2019-04-16 ENCOUNTER — Ambulatory Visit: Payer: BC Managed Care – PPO | Admitting: Family Medicine

## 2019-04-16 DIAGNOSIS — J4530 Mild persistent asthma, uncomplicated: Secondary | ICD-10-CM

## 2019-04-16 NOTE — Progress Notes (Signed)
Tina [name] - 33 y.o. female MRN [id number]  Date of birth: 04/08/1986  Subjective Chief Complaint  Patient presents with  . Hospitalization Follow-up    Clearing her throat alot, dry cough, feels like it is her seasonal allergies Denies wheezing     HPI Tina [name] is a 33 y.o. female here today for follow up from recent ED visit.  She was seen on 04/06/2019 for cough and shortness of breath that seemed to be related to asthma exacerbation.  She was given albuterol in the ED with improvement of symptoms.  She declined po steroids but has continued on her flovent inhaler.  She does report that she has mild dry cough and scratchy throat that she thinks are related to allergies.  She is not taking xyzal as it makes her too drowsy and hard to wake up in the morning.  She also is not using flonase.  She does see an allergist and is planning on starting immunotherapy soon.    ROS:  A comprehensive ROS was completed and negative except as noted per HPI  Allergies  Allergen Reactions  . Banana Other (See Comments)    Throat swelling  . Benadryl [Diphenhydramine Hcl]     Past Medical History:  Diagnosis Date  . Allergy   . Asthma   . Heart murmur     Past Surgical History:  Procedure Laterality Date  . BREAST EXCISIONAL BIOPSY Left 2007  . COLPOSCOPY    . LAPAROSCOPY    . WISDOM TOOTH EXTRACTION      Social History   Socioeconomic History  . Marital status: Single    Spouse name: Not on file  . Number of children: Not on file  . Years of education: Not on file  . Highest education level: Not on file  Occupational History  . Not on file  Social Needs  . Financial resource strain: Not hard at all  . Food insecurity    Worry: Never true    Inability: Never true  . Transportation needs    Medical: No    Non-medical: No  Tobacco Use  . Smoking status: Former Smoker    Types: E-cigarettes  . Smokeless tobacco: Never Used  Substance and Sexual Activity  . Alcohol use: Yes     Frequency: Never    Comment: occ  . Drug use: No  . Sexual activity: Yes    Birth control/protection: Condom  Lifestyle  . Physical activity    Days per week: 0 days    Minutes per session: 0 min  . Stress: Not at all  Relationships  . Social connections    Talks on phone: More than three times a week    Gets together: Once a week    Attends religious service: Never    Active member of club or organization: Yes    Attends meetings of clubs or organizations: 1 to 4 times per year    Relationship status: Never married  Other Topics Concern  . Not on file  Social History Narrative  . Not on file    Family History  Problem Relation Age of Onset  . Diabetes Mother   . Hypertension Father   . Asthma Father   . Diabetes Father   . Cancer Maternal Aunt        colon  . Lupus Maternal Aunt   . Diabetes Maternal Aunt   . Breast cancer Maternal Grandmother   . Breast cancer Paternal Grandmother   . Diabetes  Paternal Grandmother   . Hypertension Maternal Aunt   . Diabetes Maternal Aunt   . Diabetes Maternal Aunt   . Allergic rhinitis Sister   . Food Allergy Sister   . Eczema Sister   . Asthma Maternal Uncle   . Diabetes Paternal Grandfather     Health Maintenance  Topic Date Due  . HIV Screening  02/21/2002  . TETANUS/TDAP  02/21/2006  . PAP SMEAR-Modifier  02/22/2008  . INFLUENZA VACCINE  02/08/2019    ----------------------------------------------------------------------------------------------------------------------------------------------------------------------------------------------------------------- Physical Exam BP 104/60   Pulse 98   Temp 98.1 F (36.7 C)   Resp 16   Ht 4\' 11"  (1.499 m)   Wt 114 lb 9.6 oz (52 kg)   LMP 04/01/2019   SpO2 100%   BMI 23.15 kg/m   Physical Exam Constitutional:      Appearance: Normal appearance.  HENT:     Head: Normocephalic and atraumatic.     Mouth/Throat:     Mouth: Mucous membranes are moist.  Eyes:      General: No scleral icterus. Neck:     Musculoskeletal: Neck supple.  Cardiovascular:     Rate and Rhythm: Normal rate and regular rhythm.  Pulmonary:     Effort: Pulmonary effort is normal.     Breath sounds: Normal breath sounds.  Skin:    General: Skin is warm and dry.     Findings: No rash.  Neurological:     General: No focal deficit present.     Mental Status: She is alert.  Psychiatric:        Mood and Affect: Mood normal.        Behavior: Behavior normal.     ------------------------------------------------------------------------------------------------------------------------------------------------------------------------------------------------------------------- Assessment and Plan  Mild persistent asthma -Continue flovent with albuterol as needed.   -Add back on antihistamine for allergy component.  Recommend using allegra is xyzal makes her too drowsy.  Use flonase consistently.  -Recommend flu vaccine however she declines at this time.

## 2019-04-16 NOTE — Assessment & Plan Note (Signed)
-  Continue flovent with albuterol as needed.   -Add back on antihistamine for allergy component.  Recommend using allegra is xyzal makes her too drowsy.  Use flonase consistently.  -Recommend flu vaccine however she declines at this time.

## 2019-04-16 NOTE — Patient Instructions (Signed)
Try allegra (fexofenadine) for allergies Add back on flonase Continue current inhalers.  I would recommend a flu shot as well.

## 2019-05-02 ENCOUNTER — Ambulatory Visit: Payer: BC Managed Care – PPO

## 2019-05-22 ENCOUNTER — Ambulatory Visit: Payer: BC Managed Care – PPO | Admitting: Allergy and Immunology

## 2019-06-19 ENCOUNTER — Ambulatory Visit: Payer: BC Managed Care – PPO | Admitting: Allergy and Immunology

## 2019-06-27 ENCOUNTER — Ambulatory Visit: Payer: Self-pay

## 2019-06-27 NOTE — Telephone Encounter (Signed)
Patient called stating that she gets a warm sensation on the bottom of her left foot.  She has had no injury.  She states that it is in the arch area. She does not wear high heeled shoes.  She dose not work on her feet.  She dose walk and exercise. She has no redness or swelling.She see no evidence of bug bite or other injury. She has no pain just needs to know what might be causing the warm sensation. Care advice included wear proper fitting shoes appropriate for activity. Stay off foot when able. Note will be routed to office for further advice. Patient is aware and has verbalized understanding.  Reason for Disposition . [1] Caller requesting NON-URGENT health information AND [2] PCP's office is the best resource  Answer Assessment - Initial Assessment Questions 1. REASON FOR CALL or QUESTION: "What is your reason for calling today?" or "How can I best help you?" or "What question do you have that I can help answer?"     Left foot sole has warm sensation.  What can be done  Protocols used: INFORMATION ONLY CALL-A-AH

## 2019-06-27 NOTE — Telephone Encounter (Signed)
Recommend monitoring over the weekend.  If developing pain or any additional symptoms have her contact us.

## 2019-07-01 NOTE — Telephone Encounter (Signed)
Called patient to check and see how her foot was feeling. Per patient she no longer has the issues that she was having a few days ago. All symptoms are better and there are no concerns at this time.

## 2019-07-09 ENCOUNTER — Telehealth: Payer: Self-pay | Admitting: Family Medicine

## 2019-07-09 ENCOUNTER — Ambulatory Visit: Payer: Self-pay | Admitting: *Deleted

## 2019-07-09 NOTE — Telephone Encounter (Unsigned)
Copied from [place] [phone]. Topic: General - Other >> Jul 09, 2019 12:23 PM Mcneil, Ja-Kwan wrote: Reason for CRM: Pt stated that her sister just informed her that she has shingles and it is contagious. Pt would like to know what she should do since she has been around her sister. Pt also has other questions. Pt requests call back. Cb# [phone]

## 2019-07-09 NOTE — Telephone Encounter (Signed)
Reason for CRM: Pt stated that her sister just informed her that she has shingles and it is contagious. Pt would like to know what she should do since she has been around her sister. Pt also has other questions. Pt requests call back. Cb# [phone]    Returned call to patient regarding her sister having shingles. She is visiting her sister in [place] and just found out today that her sister has shingles thru a virtual visit.  She stated her sister has had the burning and itching. The rash is on her stomach about the size of the palm of her hand. Only has dried crusty areas there. No blisters. She is concerned that her sister may  be contagious to her. The patient has had chicken pox. And she is not pregnant. She is advised not to use any towels or handle things that her sister has used.  And to make sure every one is using good handwashing. Will route to her LB at [facility] for review.   Reason for Disposition . Health Information question, no triage required and triager able to answer question  Answer Assessment - Initial Assessment Questions 1. REASON FOR CALL or QUESTION: "What is your reason for calling today?" or "How can I best help you?" or "What question do you have that I can help answer?"     Shingles questions  Protocols used: [place]

## 2019-07-09 NOTE — Telephone Encounter (Signed)
See triage encounter on 07/09/19.

## 2019-07-23 ENCOUNTER — Encounter: Payer: Self-pay | Admitting: Allergy and Immunology

## 2019-07-23 ENCOUNTER — Ambulatory Visit (INDEPENDENT_AMBULATORY_CARE_PROVIDER_SITE_OTHER): Payer: BC Managed Care – PPO | Admitting: Allergy and Immunology

## 2019-07-23 ENCOUNTER — Other Ambulatory Visit: Payer: Self-pay

## 2019-07-23 VITALS — BP 110/70 | HR 82 | Temp 98.4°F | Resp 16

## 2019-07-23 DIAGNOSIS — K219 Gastro-esophageal reflux disease without esophagitis: Secondary | ICD-10-CM | POA: Diagnosis not present

## 2019-07-23 DIAGNOSIS — J3089 Other allergic rhinitis: Secondary | ICD-10-CM | POA: Diagnosis not present

## 2019-07-23 DIAGNOSIS — J4530 Mild persistent asthma, uncomplicated: Secondary | ICD-10-CM

## 2019-07-23 MED ORDER — OMEPRAZOLE 20 MG PO CPDR: 20.0000 mg | DELAYED_RELEASE_CAPSULE | Freq: Every day | ORAL | 5 refills | Status: DC

## 2019-07-23 NOTE — Patient Instructions (Addendum)
Mild persistent asthma Stable.  Continue Flovent (fluticasone) 110 g, 2 inhalations via spacer device twice a day and albuterol HFA, 1 to 2 inhalations every 4-6 hours if needed.  During respiratory tract infections or asthma flares, increase Flovent 110g to 3 inhalations via spacer device 3 times per day until symptoms have returned to baseline.  Subjective and objective measures of pulmonary function will be followed and the treatment plan will be adjusted accordingly.  Seasonal and perennial allergic rhinitis  Continue appropriate aeroallergen avoidance measures, levocetirizine 5 mg daily as needed, fluticasone nasal spray, 2 sprays per nostril daily as needed.   Nasal saline spray (i.e., Simply Saline) or nasal saline lavage (i.e., NeilMed) is recommended as needed and prior to medicated nasal sprays.  If allergen avoidance measures and medications fail to adequately relieve symptoms, aeroallergen immunotherapy will be considered.  GERD (gastroesophageal reflux disease) The patient's history suggests gastroesophageal reflux disease.  Appropriate reflux lifestyle modifications have been provided.  A prescription has been provided for omeprazole 20 mg daily, 30 minutes prior to breakfast.   Return in about 4 months (around 11/20/2019), or if symptoms worsen or fail to improve.

## 2019-07-23 NOTE — Assessment & Plan Note (Signed)
The patient's history suggests gastroesophageal reflux disease.  Appropriate reflux lifestyle modifications have been provided.  A prescription has been provided for omeprazole 20 mg daily, 30 minutes prior to breakfast.

## 2019-07-23 NOTE — Assessment & Plan Note (Signed)
   Continue appropriate aeroallergen avoidance measures, levocetirizine 5 mg daily as needed, fluticasone nasal spray, 2 sprays per nostril daily as needed.   Nasal saline spray (i.e., Simply Saline) or nasal saline lavage (i.e., NeilMed) is recommended as needed and prior to medicated nasal sprays.  If allergen avoidance measures and medications fail to adequately relieve symptoms, aeroallergen immunotherapy will be considered.

## 2019-07-23 NOTE — Progress Notes (Signed)
Follow-up Note  RE: Tina [name] MRN: [id number] DOB: Dec 07, 1985 Date of Office Visit: 07/23/2019  Primary care provider: Everrett Coombe, DO Referring provider: Everrett Coombe, DO  History of present illness: Tina [name] is a 34 y.o. female with persistent asthma, allergic rhinitis, and food allergy presenting today for follow-up.  She is previously seen in this clinic for her initial evaluation in July 2020.  She reports that in October she experienced some pain on her right side lasting for approximately 60 to 90 minutes.  She took albuterol without perceived benefit and went to urgent care.  She was treated with Xopenex and her symptoms resolved.  Chest x-ray was negative.  She was diagnosed with an asthma exacerbation.  She has had no significant lower respiratory symptoms in the interval since that time.  She does not experience limitations in normal daily activities or nocturnal awakenings due to lower respiratory symptoms.  She does note that she has been under stress and believes that she occasionally experiences "low-key panic attacks" causing some perceived dyspnea.  However, she is able to calm herself down and the dyspnea resolves. Tina [name] has been taking levocetirizine and fluticasone nasal spray with benefit. She carefully avoids bananas, however admits that she has not yet had her prescription for epinephrine autoinjector filled. Tina [name] notes that when she experiences a dry cough with certain meals and experiences heartburn "all the time."  She attempts to treat heartburn with Tums as needed.  Assessment and plan: Mild persistent asthma Stable.  Continue Flovent (fluticasone) 110 g, 2 inhalations via spacer device twice a day and albuterol HFA, 1 to 2 inhalations every 4-6 hours if needed.  During respiratory tract infections or asthma flares, increase Flovent 110g to 3 inhalations via spacer device 3 times per day until symptoms have returned to baseline.  Subjective and  objective measures of pulmonary function will be followed and the treatment plan will be adjusted accordingly.  Seasonal and perennial allergic rhinitis  Continue appropriate aeroallergen avoidance measures, levocetirizine 5 mg daily as needed, fluticasone nasal spray, 2 sprays per nostril daily as needed.   Nasal saline spray (i.e., Simply Saline) or nasal saline lavage (i.e., NeilMed) is recommended as needed and prior to medicated nasal sprays.  If allergen avoidance measures and medications fail to adequately relieve symptoms, aeroallergen immunotherapy will be considered.  GERD (gastroesophageal reflux disease) The patient's history suggests gastroesophageal reflux disease.  Appropriate reflux lifestyle modifications have been provided.  A prescription has been provided for omeprazole 20 mg daily, 30 minutes prior to breakfast.   Meds ordered this encounter  Medications  . omeprazole (PRILOSEC) 20 MG capsule    Sig: Take 1 capsule (20 mg total) by mouth daily. Take 30 minutes prior to breakfast    Dispense:  30 capsule    Refill:  5    Diagnostics: Spirometry:  Normal with an FEV1 of 95% predicted. This study was performed while the patient was asymptomatic.  Please see scanned spirometry results for details.    Physical examination: Blood pressure 110/70, pulse 82, temperature 98.4 F (36.9 C), temperature source Oral, resp. rate 16, SpO2 100 %.  General: Alert, interactive, in no acute distress. HEENT: TMs pearly gray, turbinates mildly edematous without discharge, post-pharynx mildly erythematous. Neck: Supple without lymphadenopathy. Lungs: Clear to auscultation without wheezing, rhonchi or rales. CV: Normal S1, S2 without murmurs. Skin: Warm and dry, without lesions or rashes.  The following portions of the patient's history were reviewed and updated as appropriate: allergies, current  medications, past family history, past medical history, past social history, past  surgical history and problem list.  Current Outpatient Medications  Medication Sig Dispense Refill  . albuterol (VENTOLIN HFA) 108 (90 Base) MCG/ACT inhaler 2 puffs every 4-6 hours as needed for coughing or wheezing spells 18 g 1  . fluticasone (FLONASE) 50 MCG/ACT nasal spray Place 2 sprays into both nostrils daily as needed for allergies or rhinitis. 16 g 5  . fluticasone (FLOVENT HFA) 110 MCG/ACT inhaler Inhale 2 puffs into the lungs 2 (two) times a day. Rinse, gargle and spit out after use (Patient taking differently: Inhale 2 puffs into the lungs as needed. Rinse, gargle and spit out after use) 1 Inhaler 5  . levocetirizine (XYZAL) 5 MG tablet Take 1 tablet (5 mg total) by mouth daily as needed for allergies. 30 tablet 5  . EPINEPHrine (AUVI-Q) 0.3 mg/0.3 mL IJ SOAJ injection Use as directed for severe allergic reactions (Patient not taking: Reported on 07/23/2019) 4 each 3  . omeprazole (PRILOSEC) 20 MG capsule Take 1 capsule (20 mg total) by mouth daily. Take 30 minutes prior to breakfast 30 capsule 5   No current facility-administered medications for this visit.    Allergies  Allergen Reactions  . Banana Other (See Comments)    Throat swelling  . Benadryl [Diphenhydramine Hcl]    Review of systems: Review of systems negative except as noted in HPI / PMHx.  Past Medical History:  Diagnosis Date  . Allergy   . Asthma   . Heart murmur     Family History  Problem Relation Age of Onset  . Diabetes Mother   . Hypertension Father   . Asthma Father   . Diabetes Father   . Cancer Maternal Aunt        colon  . Lupus Maternal Aunt   . Diabetes Maternal Aunt   . Breast cancer Maternal Grandmother   . Breast cancer Paternal Grandmother   . Diabetes Paternal Grandmother   . Hypertension Maternal Aunt   . Diabetes Maternal Aunt   . Diabetes Maternal Aunt   . Allergic rhinitis Sister   . Food Allergy Sister   . Eczema Sister   . Asthma Maternal Uncle   . Diabetes Paternal  Grandfather     Social History   Socioeconomic History  . Marital status: Single    Spouse name: Not on file  . Number of children: Not on file  . Years of education: Not on file  . Highest education level: Not on file  Occupational History  . Not on file  Tobacco Use  . Smoking status: Former Smoker    Types: E-cigarettes  . Smokeless tobacco: Never Used  Substance and Sexual Activity  . Alcohol use: Yes    Comment: occ  . Drug use: No  . Sexual activity: Yes    Birth control/protection: Condom  Other Topics Concern  . Not on file  Social History Narrative  . Not on file   Social Determinants of Health   Financial Resource Strain: Low Risk   . Difficulty of Paying Living Expenses: Not hard at all  Food Insecurity: No Food Insecurity  . Worried About Charity fundraiser in the Last Year: Never true  . Ran Out of Food in the Last Year: Never true  Transportation Needs: No Transportation Needs  . Lack of Transportation (Medical): No  . Lack of Transportation (Non-Medical): No  Physical Activity:   . Days of Exercise per Week:  Not on file  . Minutes of Exercise per Session: Not on file  Stress:   . Feeling of Stress : Not on file  Social Connections:   . Frequency of Communication with Friends and Family: Not on file  . Frequency of Social Gatherings with Friends and Family: Not on file  . Attends Religious Services: Not on file  . Active Member of Clubs or Organizations: Not on file  . Attends Banker Meetings: Not on file  . Marital Status: Not on file  Intimate Partner Violence:   . Fear of Current or Ex-Partner: Not on file  . Emotionally Abused: Not on file  . Physically Abused: Not on file  . Sexually Abused: Not on file    I appreciate the opportunity to take part in Redina's care. Please do not hesitate to contact me with questions.  Sincerely,   R. Jorene Guest, MD

## 2019-07-23 NOTE — Assessment & Plan Note (Signed)
Stable.  Continue Flovent (fluticasone) 110 g, 2 inhalations via spacer device twice a day and albuterol HFA, 1 to 2 inhalations every 4-6 hours if needed.  During respiratory tract infections or asthma flares, increase Flovent 110g to 3 inhalations via spacer device 3 times per day until symptoms have returned to baseline.  Subjective and objective measures of pulmonary function will be followed and the treatment plan will be adjusted accordingly.

## 2019-07-24 ENCOUNTER — Telehealth: Payer: Self-pay | Admitting: Allergy and Immunology

## 2019-07-24 ENCOUNTER — Telehealth (INDEPENDENT_AMBULATORY_CARE_PROVIDER_SITE_OTHER): Payer: BC Managed Care – PPO | Admitting: Family Medicine

## 2019-07-24 ENCOUNTER — Encounter: Payer: Self-pay | Admitting: Family Medicine

## 2019-07-24 DIAGNOSIS — Z20822 Contact with and (suspected) exposure to covid-19: Secondary | ICD-10-CM | POA: Diagnosis not present

## 2019-07-24 NOTE — Assessment & Plan Note (Signed)
Father tested + for covid however greater than 10 days since she has last seen him.  She has been around her mother more recently however she has not had symptoms.  Patient is awaiting test results from this morning.  Recommend that she self monitor for symptoms at home. Contact clinic if test + or develops symptoms.  Discussed that antibody test would not really be helpful at this time.

## 2019-07-24 NOTE — Telephone Encounter (Signed)
PT found out this morning that her father tested positive for Covid and would like to be tested herself. PT was around her father on new years day but not since then. PT was in office for ov yesterday and no significant covid symptoms.

## 2019-07-24 NOTE — Progress Notes (Signed)
Tina [name] - 34 y.o. female MRN [id number]  Date of birth: Mar 25, 1986   This visit type was conducted due to national recommendations for restrictions regarding the COVID-19 Pandemic (e.g. social distancing).  This format is felt to be most appropriate for this patient at this time.  All issues noted in this document were discussed and addressed.  No physical exam was performed (except for noted visual exam findings with Video Visits).  I discussed the limitations of evaluation and management by telemedicine and the availability of in person appointments. The patient expressed understanding and agreed to proceed.  I connected with@ on 07/24/19 at 10:40 AM EST by a video enabled telemedicine application and verified that I am speaking with the correct person using two identifiers.  Present at visit: Everrett Coombe, DO Merla Riches   Patient Location: Home [address] [place] [address]   Provider location:   Home office  Chief Complaint  Patient presents with  . covid question    Question about COVID exposure    HPI  Tina [name] is a 34 y.o. female who presents via audio/video conferencing for a telehealth visit today.  She reports that her father tested recently tested + for COVID-19.  She was last around him on 07/12/2019 but has been around her mother more recently.  Her mother has not had any symptoms and she has not had any symptoms. She did go and get tested this morning.  She is concerned due to her asthma.  She is asking about antibody testing as well.    ROS:  A comprehensive ROS was completed and negative except as noted per HPI  Past Medical History:  Diagnosis Date  . Allergy   . Asthma   . Heart murmur     Past Surgical History:  Procedure Laterality Date  . BREAST EXCISIONAL BIOPSY Left 2007  . COLPOSCOPY    . LAPAROSCOPY    . WISDOM TOOTH EXTRACTION      Family History  Problem Relation Age of Onset  . Diabetes Mother   . Hypertension  Father   . Asthma Father   . Diabetes Father   . Cancer Maternal Aunt        colon  . Lupus Maternal Aunt   . Diabetes Maternal Aunt   . Breast cancer Maternal Grandmother   . Breast cancer Paternal Grandmother   . Diabetes Paternal Grandmother   . Hypertension Maternal Aunt   . Diabetes Maternal Aunt   . Diabetes Maternal Aunt   . Allergic rhinitis Sister   . Food Allergy Sister   . Eczema Sister   . Asthma Maternal Uncle   . Diabetes Paternal Grandfather     Social History   Socioeconomic History  . Marital status: Single    Spouse name: Not on file  . Number of children: Not on file  . Years of education: Not on file  . Highest education level: Not on file  Occupational History  . Not on file  Tobacco Use  . Smoking status: Former Smoker    Types: E-cigarettes  . Smokeless tobacco: Never Used  Substance and Sexual Activity  . Alcohol use: Yes    Comment: occ  . Drug use: No  . Sexual activity: Yes    Birth control/protection: Condom  Other Topics Concern  . Not on file  Social History Narrative  . Not on file   Social Determinants of Health   Financial Resource Strain: Low Risk   .  Difficulty of Paying Living Expenses: Not hard at all  Food Insecurity: No Food Insecurity  . Worried About Charity fundraiser in the Last Year: Never true  . Ran Out of Food in the Last Year: Never true  Transportation Needs: No Transportation Needs  . Lack of Transportation (Medical): No  . Lack of Transportation (Non-Medical): No  Physical Activity:   . Days of Exercise per Week: Not on file  . Minutes of Exercise per Session: Not on file  Stress:   . Feeling of Stress : Not on file  Social Connections:   . Frequency of Communication with Friends and Family: Not on file  . Frequency of Social Gatherings with Friends and Family: Not on file  . Attends Religious Services: Not on file  . Active Member of Clubs or Organizations: Not on file  . Attends Archivist  Meetings: Not on file  . Marital Status: Not on file  Intimate Partner Violence:   . Fear of Current or Ex-Partner: Not on file  . Emotionally Abused: Not on file  . Physically Abused: Not on file  . Sexually Abused: Not on file     Current Outpatient Medications:  .  albuterol (VENTOLIN HFA) 108 (90 Base) MCG/ACT inhaler, 2 puffs every 4-6 hours as needed for coughing or wheezing spells, Disp: 18 g, Rfl: 1 .  EPINEPHrine (AUVI-Q) 0.3 mg/0.3 mL IJ SOAJ injection, Use as directed for severe allergic reactions (Patient not taking: Reported on 07/23/2019), Disp: 4 each, Rfl: 3 .  fluticasone (FLONASE) 50 MCG/ACT nasal spray, Place 2 sprays into both nostrils daily as needed for allergies or rhinitis., Disp: 16 g, Rfl: 5 .  fluticasone (FLOVENT HFA) 110 MCG/ACT inhaler, Inhale 2 puffs into the lungs 2 (two) times a day. Rinse, gargle and spit out after use (Patient taking differently: Inhale 2 puffs into the lungs as needed. Rinse, gargle and spit out after use), Disp: 1 Inhaler, Rfl: 5 .  levocetirizine (XYZAL) 5 MG tablet, Take 1 tablet (5 mg total) by mouth daily as needed for allergies., Disp: 30 tablet, Rfl: 5 .  omeprazole (PRILOSEC) 20 MG capsule, Take 1 capsule (20 mg total) by mouth daily. Take 30 minutes prior to breakfast, Disp: 30 capsule, Rfl: 5  EXAM:  VITALS per patient if applicable: Pulse 80   Temp 98 F (36.7 C)   SpO2 99%   GENERAL: alert, oriented, appears well and in no acute distress  HEENT: atraumatic, conjunttiva clear, no obvious abnormalities on inspection of external nose and ears  NECK: normal movements of the head and neck  LUNGS: on inspection no signs of respiratory distress, breathing rate appears normal, no obvious gross SOB, gasping or wheezing  CV: no obvious cyanosis  MS: moves all visible extremities without noticeable abnormality  PSYCH/NEURO: pleasant and cooperative, no obvious depression or anxiety, speech and thought processing grossly  intact  ASSESSMENT AND PLAN:  Discussed the following assessment and plan:  Contact with and (suspected) exposure to covid-19 Father tested + for covid however greater than 10 days since she has last seen him.  She has been around her mother more recently however she has not had symptoms.  Patient is awaiting test results from this morning.  Recommend that she self monitor for symptoms at home. Contact clinic if test + or develops symptoms.  Discussed that antibody test would not really be helpful at this time.      I discussed the assessment and treatment plan with  the patient. The patient was provided an opportunity to ask questions and all were answered. The patient agreed with the plan and demonstrated an understanding of the instructions.   The patient was advised to call back or seek an in-person evaluation if the symptoms worsen or if the condition fails to improve as anticipated.    Luetta Nutting, DO

## 2019-07-24 NOTE — Telephone Encounter (Signed)
Spoke with pt she has not been around dad since new years day but has been around mom who lives with dad. She talked to her pcp via video chat this morning and he sent her on to be tested. She is waiting on results.  And will call us to give [place] the results once available

## 2019-07-29 ENCOUNTER — Emergency Department (HOSPITAL_BASED_OUTPATIENT_CLINIC_OR_DEPARTMENT_OTHER)
Admission: EM | Admit: 2019-07-29 | Discharge: 2019-07-29 | Disposition: A | Discharge status: Home / Self Care | Payer: BC Managed Care – PPO | Attending: Emergency Medicine | Admitting: Emergency Medicine

## 2019-07-29 ENCOUNTER — Other Ambulatory Visit: Payer: Self-pay

## 2019-07-29 ENCOUNTER — Ambulatory Visit: Payer: Self-pay | Admitting: *Deleted

## 2019-07-29 ENCOUNTER — Emergency Department (HOSPITAL_BASED_OUTPATIENT_CLINIC_OR_DEPARTMENT_OTHER): Payer: BC Managed Care – PPO

## 2019-07-29 ENCOUNTER — Telehealth: Payer: Self-pay | Admitting: *Deleted

## 2019-07-29 ENCOUNTER — Encounter (HOSPITAL_BASED_OUTPATIENT_CLINIC_OR_DEPARTMENT_OTHER): Payer: Self-pay | Admitting: Emergency Medicine

## 2019-07-29 DIAGNOSIS — Z87891 Personal history of nicotine dependence: Secondary | ICD-10-CM | POA: Diagnosis not present

## 2019-07-29 DIAGNOSIS — R079 Chest pain, unspecified: Secondary | ICD-10-CM | POA: Diagnosis not present

## 2019-07-29 DIAGNOSIS — R519 Headache, unspecified: Secondary | ICD-10-CM | POA: Insufficient documentation

## 2019-07-29 DIAGNOSIS — J45909 Unspecified asthma, uncomplicated: Secondary | ICD-10-CM | POA: Insufficient documentation

## 2019-07-29 LAB — BASIC METABOLIC PANEL
Anion gap: 8 (ref 5–15)
BUN: 8 mg/dL (ref 6–20)
CO2: 27 mmol/L (ref 22–32)
Calcium: 9.3 mg/dL (ref 8.9–10.3)
Chloride: 103 mmol/L (ref 98–111)
Creatinine, Ser: 0.67 mg/dL (ref 0.44–1.00)
GFR calc Af Amer: >60 mL/min (ref >60)
GFR calc non Af Amer: >60 mL/min (ref >60)
Glucose, Bld: 87 mg/dL (ref 70–99)
Potassium: 3.3 mmol/L — ABNORMAL LOW (ref 3.5–5.1)
Sodium: 138 mmol/L (ref 135–145)

## 2019-07-29 LAB — CBC
HCT: 37.9 % (ref 36.0–46.0)
Hemoglobin: 12.6 g/dL (ref 12.0–15.0)
MCH: 30.7 pg (ref 26.0–34.0)
MCHC: 33.2 g/dL (ref 30.0–36.0)
MCV: 92.4 fL (ref 80.0–100.0)
Platelets: 317 10*3/uL (ref 150–400)
RBC: 4.1 MIL/uL (ref 3.87–5.11)
RDW: 12.7 % (ref 11.5–15.5)
WBC: 4.8 10*3/uL (ref 4.0–10.5)
nRBC: 0 % (ref 0.0–0.2)

## 2019-07-29 LAB — TROPONIN I (HIGH SENSITIVITY): Troponin I (High Sensitivity): <2 ng/L (ref <18)

## 2019-07-29 MED ORDER — POTASSIUM CHLORIDE CRYS ER 20 MEQ PO TBCR
40.0000 meq | EXTENDED_RELEASE_TABLET | Freq: Once | ORAL | Status: AC
  Administered 2019-07-29: 40 meq via ORAL
  Filled 2019-07-29: qty 2

## 2019-07-29 NOTE — Telephone Encounter (Signed)
Called patient to go over symptoms and either direct her to go to ED or schedule an appointment, per patient she was currently at the hospital being treated. Pt states that the pain have improved but they were going to do a EKG and she would just call us to schedule a follow up appointment.

## 2019-07-29 NOTE — Telephone Encounter (Signed)
PT called to let us know that she tested negative for covid.

## 2019-07-29 NOTE — ED Provider Notes (Signed)
[facility] EMERGENCY DEPARTMENT Provider Note   CSN: [id number] Arrival date & time: 07/29/19  2376     History Chief Complaint  Patient presents with  . Chest Pain    Tina [name] is a 34 y.o. female.  34yo female presents with complaint of chest pain, onset around 4PM yesterday while sitting on the sofa.  Pain is located left upper breast area radiates to left mid axillary area, constant, worse at times, described as sharp.  Nothing makes the pain worse is specifically deep breaths, exertion, palpation.  Nothing seems to make pain any better, no relief with anti-acid.  Patient reports sleep is disrupted secondary to pain and discomfort last night.  Pain is improved today however still present.  Denies any associated nausea, vomiting, shortness of breath, diaphoresis.  Patient reports she has had a slight headache and has not had much of an appetite for the past 3 days, suspect secondary to stress, works teaching middle school business class.  Reports past medical history of a heart murmur, is a non-smoker, no history of diabetes, hyperlipidemia, hypertension.  No significant family cardiac history.  No other complaints or concerns.     HPI: A 34 year old patient presents for evaluation of chest pain. Initial onset of pain was less than one hour ago. The patient's chest pain is well-localized, is sharp and is not worse with exertion. The patient's chest pain is middle- or left-sided, is not described as heaviness/pressure/tightness and does not radiate to the arms/jaw/neck. The patient does not complain of nausea and denies diaphoresis. The patient has no history of stroke, has no history of peripheral artery disease, has not smoked in the past 90 days, denies any history of treated diabetes, has no relevant family history of coronary artery disease (first degree relative at less than age 51), is not hypertensive, has no history of hypercholesterolemia and does not have an elevated  BMI (>=30).   Past Medical History:  Diagnosis Date  . Allergy   . Asthma   . Heart murmur     Patient Active Problem List   Diagnosis Date Noted  . Contact with and (suspected) exposure to covid-19 07/24/2019  . GERD (gastroesophageal reflux disease) 07/23/2019  . Asthma exacerbation 02/19/2019  . Well adult exam 02/04/2019  . Seasonal and perennial allergic rhinitis 01/22/2019  . Food allergy 01/22/2019  . Mild persistent asthma 01/14/2019    Past Surgical History:  Procedure Laterality Date  . BREAST EXCISIONAL BIOPSY Left 2007  . COLPOSCOPY    . LAPAROSCOPY    . WISDOM TOOTH EXTRACTION       OB History    Gravida  0   Para  0   Term  0   Preterm  0   AB  0   Living  0     SAB  0   TAB  0   Ectopic  0   Multiple  0   Live Births  0           Family History  Problem Relation Age of Onset  . Diabetes Mother   . Hypertension Father   . Asthma Father   . Diabetes Father   . Cancer Maternal Aunt        colon  . Lupus Maternal Aunt   . Diabetes Maternal Aunt   . Breast cancer Maternal Grandmother   . Breast cancer Paternal Grandmother   . Diabetes Paternal Grandmother   . Hypertension Maternal Aunt   . Diabetes  Maternal Aunt   . Diabetes Maternal Aunt   . Allergic rhinitis Sister   . Food Allergy Sister   . Eczema Sister   . Asthma Maternal Uncle   . Diabetes Paternal Grandfather     Social History   Tobacco Use  . Smoking status: Former Smoker    Types: E-cigarettes  . Smokeless tobacco: Never Used  Substance Use Topics  . Alcohol use: Yes    Comment: occ  . Drug use: No    Home Medications Prior to Admission medications   Medication Sig Start Date End Date Taking? Authorizing Provider  albuterol (VENTOLIN HFA) 108 (90 Base) MCG/ACT inhaler 2 puffs every 4-6 hours as needed for coughing or wheezing spells 01/22/19   Bobbitt, Heywood Iles, MD  EPINEPHrine (AUVI-Q) 0.3 mg/0.3 mL IJ SOAJ injection Use as directed for severe  allergic reactions Patient not taking: Reported on 07/23/2019 01/22/19   Bobbitt, Heywood Iles, MD  fluticasone [facility]) 50 MCG/ACT nasal spray Place 2 sprays into both nostrils daily as needed for allergies or rhinitis. 01/22/19   Bobbitt, Heywood Iles, MD  fluticasone (FLOVENT HFA) 110 MCG/ACT inhaler Inhale 2 puffs into the lungs 2 (two) times a day. Rinse, gargle and spit out after use Patient taking differently: Inhale 2 puffs into the lungs as needed. Rinse, gargle and spit out after use 01/22/19   Bobbitt, Heywood Iles, MD  levocetirizine (XYZAL) 5 MG tablet Take 1 tablet (5 mg total) by mouth daily as needed for allergies. 01/22/19   Bobbitt, Heywood Iles, MD  omeprazole (PRILOSEC) 20 MG capsule Take 1 capsule (20 mg total) by mouth daily. Take 30 minutes prior to breakfast 07/23/19   Bobbitt, Heywood Iles, MD    Allergies    Banana and Benadryl [diphenhydramine hcl]  Review of Systems   Review of Systems  Constitutional: Negative for chills, diaphoresis and fever.  Respiratory: Negative for chest tightness and shortness of breath.   Cardiovascular: Positive for chest pain. Negative for palpitations.  Gastrointestinal: Negative for constipation, diarrhea, nausea and vomiting.  Musculoskeletal: Negative for arthralgias, back pain and myalgias.  Skin: Negative for rash and wound.  Neurological: Negative for weakness and numbness.  Psychiatric/Behavioral: Negative for confusion.  All other systems reviewed and are negative.   Physical Exam Updated Vital Signs BP 114/78 (BP Location: Left Arm)   Pulse 73   Temp 98.7 F (37.1 C) (Oral)   Resp 14   Ht 5' (1.524 m)   Wt 49.4 kg   LMP 07/24/2019   SpO2 100%   BMI 21.29 kg/m   Physical Exam Vitals and nursing note reviewed.  Constitutional:      General: She is not in acute distress.    Appearance: She is well-developed. She is not diaphoretic.  HENT:     Head: Normocephalic and atraumatic.  Cardiovascular:     Rate and  Rhythm: Normal rate and regular rhythm.     Pulses:          Dorsalis pedis pulses are 2+ on the right side and 2+ on the left side.     Heart sounds: Normal heart sounds.  Pulmonary:     Effort: Pulmonary effort is normal.     Breath sounds: Normal breath sounds.  Chest:     Chest wall: No tenderness.  Abdominal:     Palpations: Abdomen is soft.     Tenderness: There is no abdominal tenderness.  Musculoskeletal:     Cervical back: Neck supple.  Right lower leg: No edema.     Left lower leg: No edema.  Skin:    General: Skin is warm and dry.     Findings: No erythema or rash.  Neurological:     Mental Status: She is alert and oriented to person, place, and time.  Psychiatric:        Behavior: Behavior normal.     ED Results / Procedures / Treatments   Labs (all labs ordered are listed, but only abnormal results are displayed) Labs Reviewed  BASIC METABOLIC PANEL - Abnormal; Notable for the following components:      Result Value   Potassium 3.3 (*)    All other components within normal limits  CBC  TROPONIN I (HIGH SENSITIVITY)    EKG None  Radiology DG Chest Port 1 View  Result Date: 07/29/2019 CLINICAL DATA:  Chest pain EXAM: PORTABLE CHEST 1 VIEW COMPARISON:  04/06/2019 FINDINGS: The heart size and mediastinal contours are within normal limits. Both lungs are clear. The visualized skeletal structures are unremarkable. IMPRESSION: No active disease. Electronically Signed   By: Davina Poke D.O.   On: 07/29/2019 10:49    Procedures Procedures (including critical care time)  Medications Ordered in ED Medications  potassium chloride SA (KLOR-CON) CR tablet 40 mEq (40 mEq Oral Given 07/29/19 1142)    ED Course  I have reviewed the triage vital signs and the nursing notes.  Pertinent labs & imaging results that were available during my care of the patient were reviewed by me and considered in my medical decision making (see chart for details).  Clinical  Course as of Jul 28 1153  Tue Jul 29, 3451  816 34 year old female with complaint of left-sided chest pain onset yesterday.  On exam patient is well-appearing, no chest wall tenderness, exam is unremarkable.  Heart score is 0 with EKG showing normal sinus rhythm, no acute ischemic changes.  CBC within normal limits, troponin x1 -, BMP with mild hypokalemia with potassium 3.3 which will be replaced orally prior to discharge.  Recommend patient follow-up with her primary care provider for further evaluation, return to ER for new or worsening symptoms.  Patient verbalizes understanding of discharge instructions and plan.   [LM]    Clinical Course User Index [LM] Roque Lias   MDM Rules/Calculators/A&P HEAR Score: 0                    Final Clinical Impression(s) / ED Diagnoses Final diagnoses:  Nonspecific chest pain    Rx / DC Orders ED Discharge Orders    None       Tacy Learn, PA-C 07/29/19 Encinal, Wenda Overland, MD 07/30/19 [phone]

## 2019-07-29 NOTE — Discharge Instructions (Signed)
Your work-up today is reassuring. Recommend follow-up with your primary care provider for further evaluation. Return to the emergency room for new or worsening symptoms.

## 2019-07-29 NOTE — Telephone Encounter (Addendum)
Pt called in c/o chest pain behind her left breast that started yesterday about mid day.   It subsides then gets worse.   It woke up her last night making it hard for her to sleep because it kept waking her up.   She denies any cardiac history except a heart murmur.   She does have asthma but states this does not feel like her asthma.   Denies any other symptoms.  No sweating, shortness of breath, dizziness, no fever no upper respiratory symptoms, no COVID-19 symptoms.   Just a decrease in appetite was only symptom mentioned and a headache outside of the chest pain.  Protocol is to be seen within 24 hours.   I called into Dr. Everrett Coombe' office so they could make her an appt.   I spoke with [facility] who was helping out.   She tried to check with Dr. Ashley Royalty and or his nurse but they were both in a patient's room.   This pt called in our community line for the [facility] instead of into Dr. Ashley Royalty' office.   They no longer have the back door number we can use. I asked the pt which number she had called and she said,   "[facility] number and it went to y'all".      I gave her the direct office number [phone]  Instructed her to call that number and let them know you had already talked with me so they can get you an appt for today.     You are ok with seeing someone else if Dr. Ashley Royalty does not have availability.  She was agreeable to this plan.    I sent these notes to Dr. Ashley Royalty office.    Reason for Disposition . [1] Chest pain lasting < 5 minutes AND [2] NO chest pain or cardiac symptoms (e.g., breathing difficulty, sweating) now  Answer Assessment - Initial Assessment Questions 1. LOCATION: "Where does it hurt?"       On left side of chest it hurts behind my breast. 2. RADIATION: "Does the pain go anywhere else?" (e.g., into neck, jaw, arms, back)     No 3. ONSET: "When did the chest pain begin?" (Minutes, hours or days)      Mid day yesterday.   Got worse  through the night   It kept me from sleeping.  When I would doze off it would wake me up. 4. PATTERN "Does the pain come and go, or has it been constant since it started?"  "Does it get worse with exertion?"      When I woke up it would be bad then it would subside a little then get worse again.  Does not hurt to take a deep breath.  I do have asthma.   No other URI symptoms. 5. DURATION: "How long does it last" (e.g., seconds, minutes, hours)     It lasts 20-30 seconds. 6. SEVERITY: "How bad is the pain?"  (e.g., Scale 1-10; mild, moderate, or severe)    - MILD (1-3): doesn't interfere with normal activities     - MODERATE (4-7): interferes with normal activities or awakens from sleep    - SEVERE (8-10): excruciating pain, unable to do any normal activities       6-7 on pain scale.    When it has subsided some it's a 4 on the scale. 7. CARDIAC RISK FACTORS: "Do you have any history of heart problems or risk factors for  heart disease?" (e.g., angina, prior heart attack; diabetes, high blood pressure, high cholesterol, smoker, or strong family history of heart disease)     I have a heart murmur.  My dad had hypertension and high cholesterol   8. PULMONARY RISK FACTORS: "Do you have any history of lung disease?"  (e.g., blood clots in lung, asthma, emphysema, birth control pills)     I have asthma.  These symptoms feel different. 9. CAUSE: "What do you think is causing the chest pain?"     I thought it may be acid reflux.   This feels different than when I have acid reflux.  I was prescribed something a couple of days ago.    I went to my allergist. They prescribed it. 10. OTHER SYMPTOMS: "Do you have any other symptoms?" (e.g., dizziness, nausea, vomiting, sweating, fever, difficulty breathing, cough)       None except a decreased appetite.   I'm having a headache.   It's constant  for a week now.   Denies nasal congestion.    I get migraines but this feels different. 11. PREGNANCY: "Is there any  chance you are pregnant?" "When was your last menstrual period?"       No.   Last Wed. Was the first day of my cycle.  Protocols used: CHEST PAIN-A-AH

## 2019-07-29 NOTE — Telephone Encounter (Signed)
I called pt back to check on her and see if she was able to get an appt or further direction from Dr. Ashley Royalty office.  She stated,  "She talked with someone in the office and they wanted her to go on to the emergency room because they would probably end up having her transferred there anyway".  I stated,  Ok, I just wanted to check on you".   "You go on to the ED so they can check you out".  She thanked me for calling back and checking on her.

## 2019-07-29 NOTE — ED Triage Notes (Signed)
Patient here with 4/10 chest pain that started last night, radiation to left arm. Hx of heart murmur and asthma. NAD, no other complains at this time.

## 2019-09-04 ENCOUNTER — Encounter: Payer: Self-pay | Admitting: Family Medicine

## 2019-09-04 ENCOUNTER — Telehealth (INDEPENDENT_AMBULATORY_CARE_PROVIDER_SITE_OTHER): Payer: BC Managed Care – PPO | Admitting: Family Medicine

## 2019-09-04 DIAGNOSIS — R42 Dizziness and giddiness: Secondary | ICD-10-CM

## 2019-09-04 MED ORDER — MECLIZINE HCL 25 MG PO TABS: 25.0000 mg | ORAL_TABLET | Freq: Three times a day (TID) | ORAL | 0 refills | Status: DC | PRN

## 2019-09-04 NOTE — Progress Notes (Signed)
Tina [name] - 34 y.o. female MRN [id number]  Date of birth: [birth year]   This visit type was conducted due to national recommendations for restrictions regarding the COVID-19 Pandemic (e.g. social distancing).  This format is felt to be most appropriate for this patient at this time.  All issues noted in this document were discussed and addressed.  No physical exam was performed (except for noted visual exam findings with Video Visits).  I discussed the limitations of evaluation and management by telemedicine and the availability of in person appointments. The patient expressed understanding and agreed to proceed.  I connected with@ on 09/04/19 at  8:10 AM EST by a video enabled telemedicine application and verified that I am speaking with the correct person using two identifiers.  Present at visit: Tina [name], Tina [name] Darol Destine   Patient Location: Home [id number] [place] [address]   Provider location:   [facility]  Chief Complaint  Patient presents with  . Dizziness    HPI  Tina [name] is a 34 y.o. female who presents via audio/video conferencing for a telehealth visit today.  She has complaint of dizziness and mild headache.  Reports that symptoms started about 3 days ago.  Dizziness is worse if standing too quickly or moving her head to the left quickly.  She has sensation of fullness/fluid in her L ear.  She denies ear pain.  She has had mild headache on L side.  Her symptoms have improved some since initial onset.  She denies sinus pain, fever, chills, vision changes or nausea.  She has not tried anything for treatment so far.    ROS:  A comprehensive ROS was completed and negative except as noted per HPI  Past Medical History:  Diagnosis Date  . Allergy   . Asthma   . Heart murmur     Past Surgical History:  Procedure Laterality Date  . BREAST EXCISIONAL BIOPSY Left 2007  . COLPOSCOPY    . LAPAROSCOPY    . WISDOM TOOTH EXTRACTION      Family History   Problem Relation Age of Onset  . Diabetes Mother   . Hypertension Father   . Asthma Father   . Diabetes Father   . Cancer Maternal Aunt        colon  . Lupus Maternal Aunt   . Diabetes Maternal Aunt   . Breast cancer Maternal Grandmother   . Breast cancer Paternal Grandmother   . Diabetes Paternal Grandmother   . Hypertension Maternal Aunt   . Diabetes Maternal Aunt   . Diabetes Maternal Aunt   . Allergic rhinitis Sister   . Food Allergy Sister   . Eczema Sister   . Asthma Maternal Uncle   . Diabetes Paternal Grandfather     Social History   Socioeconomic History  . Marital status: Single    Spouse name: Not on file  . Number of children: Not on file  . Years of education: Not on file  . Highest education level: Not on file  Occupational History  . Not on file  Tobacco Use  . Smoking status: Former Smoker    Types: E-cigarettes  . Smokeless tobacco: Never Used  Substance and Sexual Activity  . Alcohol use: Yes    Comment: occ  . Drug use: No  . Sexual activity: Yes    Birth control/protection: Condom  Other Topics Concern  . Not on file  Social History Narrative  . Not on file   Social Determinants  of Health   Financial Resource Strain: Low Risk   . Difficulty of Paying Living Expenses: Not hard at all  Food Insecurity: No Food Insecurity  . Worried About Programme researcher, broadcasting/film/video in the Last Year: Never true  . Ran Out of Food in the Last Year: Never true  Transportation Needs: No Transportation Needs  . Lack of Transportation (Medical): No  . Lack of Transportation (Non-Medical): No  Physical Activity:   . Days of Exercise per Week: Not on file  . Minutes of Exercise per Session: Not on file  Stress:   . Feeling of Stress : Not on file  Social Connections:   . Frequency of Communication with Friends and Family: Not on file  . Frequency of Social Gatherings with Friends and Family: Not on file  . Attends Religious Services: Not on file  . Active Member  of Clubs or Organizations: Not on file  . Attends Banker Meetings: Not on file  . Marital Status: Not on file  Intimate Partner Violence:   . Fear of Current or Ex-Partner: Not on file  . Emotionally Abused: Not on file  . Physically Abused: Not on file  . Sexually Abused: Not on file     Current Outpatient Medications:  .  albuterol (VENTOLIN HFA) 108 (90 Base) MCG/ACT inhaler, 2 puffs every 4-6 hours as needed for coughing or wheezing spells, Disp: 18 g, Rfl: 1 .  fluticasone (FLONASE) 50 MCG/ACT nasal spray, Place 2 sprays into both nostrils daily as needed for allergies or rhinitis., Disp: 16 g, Rfl: 5 .  levocetirizine (XYZAL) 5 MG tablet, Take 1 tablet (5 mg total) by mouth daily as needed for allergies., Disp: 30 tablet, Rfl: 5 .  omeprazole (PRILOSEC) 20 MG capsule, Take 1 capsule (20 mg total) by mouth daily. Take 30 minutes prior to breakfast, Disp: 30 capsule, Rfl: 5  EXAM:  VITALS per patient if applicable: Pulse 91   Temp 97.7 F (36.5 C) (Oral)   Ht 4\' 11"  (1.499 m)   Wt 108 lb (49 kg)   LMP 08/20/2019   SpO2 99%   BMI 21.81 kg/m   GENERAL: alert, oriented, appears well and in no acute distress  HEENT: atraumatic, conjunttiva clear, no obvious abnormalities on inspection of external nose and ears  NECK: normal movements of the head and neck  LUNGS: on inspection no signs of respiratory distress, breathing rate appears normal, no obvious gross SOB, gasping or wheezing  CV: no obvious cyanosis  MS: moves all visible extremities without noticeable abnormality  PSYCH/NEURO: pleasant and cooperative, no obvious depression or anxiety, speech and thought processing grossly intact  ASSESSMENT AND PLAN:  Discussed the following assessment and plan:  Dizziness Likely related to serous otitis media 2/2 to eustachian tube dysfunction.  Recommend combination of daily flonase and sudafed for a few days to dry up secretions.  Meclizine sent in to use prn  for dizziness.   Associated headaches improving, she will manage with OTC analgesics. Instructed to call back if symptoms worsening or not improving as anticipated.     25 minutes spent including pre visit preparation, review of prior notes and labs, encounter with patient via video visit and same day documentation.  I discussed the assessment and treatment plan with the patient. The patient was provided an opportunity to ask questions and all were answered. The patient agreed with the plan and demonstrated an understanding of the instructions.   The patient was advised to call  back or seek an in-person evaluation if the symptoms worsen or if the condition fails to improve as anticipated.    Tina [name], Tina [name]

## 2019-09-04 NOTE — Assessment & Plan Note (Signed)
Likely related to serous otitis media 2/2 to eustachian tube dysfunction.  Recommend combination of daily flonase and sudafed for a few days to dry up secretions.  Meclizine sent in to use prn for dizziness.   Associated headaches improving, she will manage with OTC analgesics. Instructed to call back if symptoms worsening or not improving as anticipated.

## 2019-09-04 NOTE — Progress Notes (Signed)
Patient is having some headaches and dizziness since Sunday.   Worse in the morning when waking up, and when she goes from sitting to standing and it comes and goes.  She has not taken anything for her HA.

## 2019-10-06 ENCOUNTER — Other Ambulatory Visit: Payer: BC Managed Care – PPO

## 2019-10-06 ENCOUNTER — Telehealth: Payer: Self-pay | Admitting: Allergy and Immunology

## 2019-10-06 NOTE — Telephone Encounter (Signed)
Forms filled out and awaiting dr bobbitts signature

## 2019-10-06 NOTE — Telephone Encounter (Signed)
Patient called needing work documents filled out. 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 Documents are at [facility] triage station.  Forms can be sent back to email address- [email or web address].

## 2019-10-09 IMAGING — US US BREAST*L* LIMITED INC AXILLA
1 series · 10 of 10 positions shown · non-contrast
Comparison: Previous exam(s).

CLINICAL DATA: Short-term follow-up for probably benign left breast
masses, 1 initially assessed 08/23/2017 with another discovered on
10/04/2018, both similar appearing and benign-appearing, most likely
fibroadenomas. Patient also has a history of previous excision of a
fibroadenoma.

EXAM:
ULTRASOUND OF THE LEFT BREAST

[Series 1: us breast*left* limited inc axilla · 0.06mm/px · 10 of 10 slices shown]
[im 1/10]
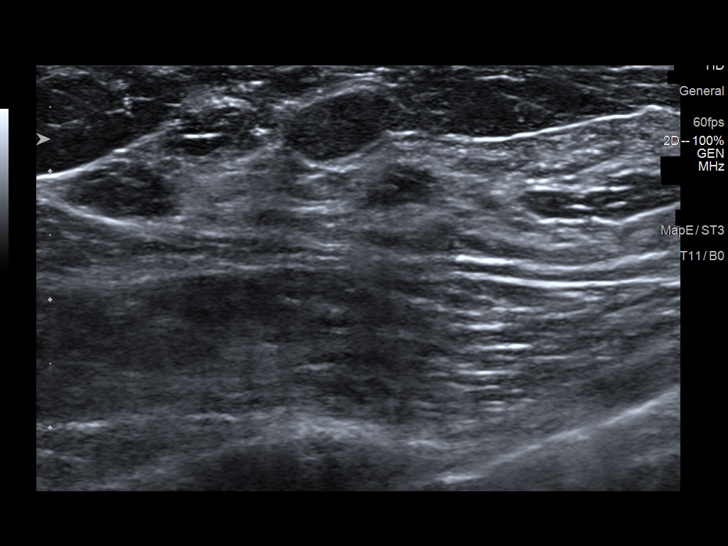
[im 2/10]
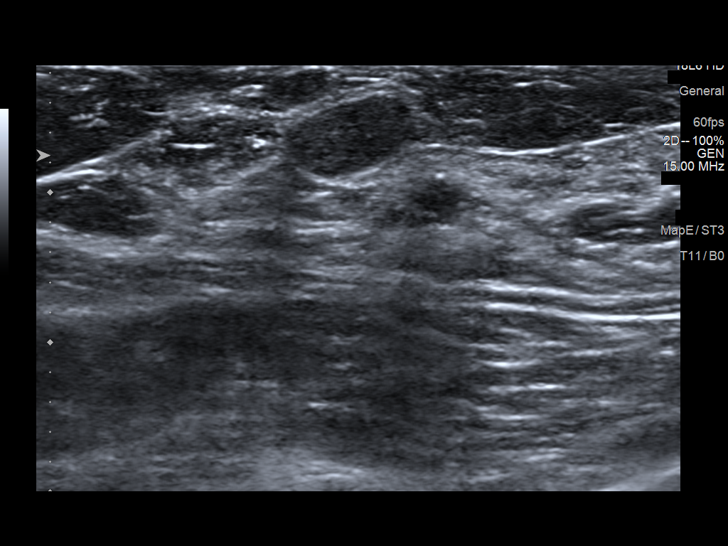
[im 3/10]
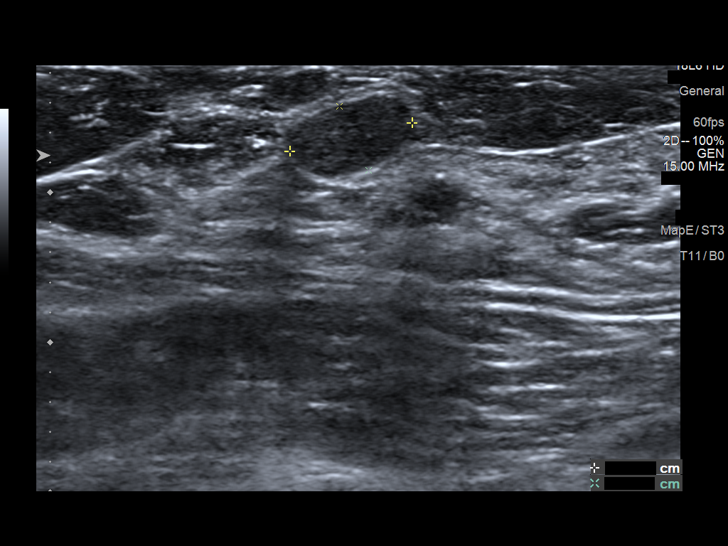
[im 4/10]
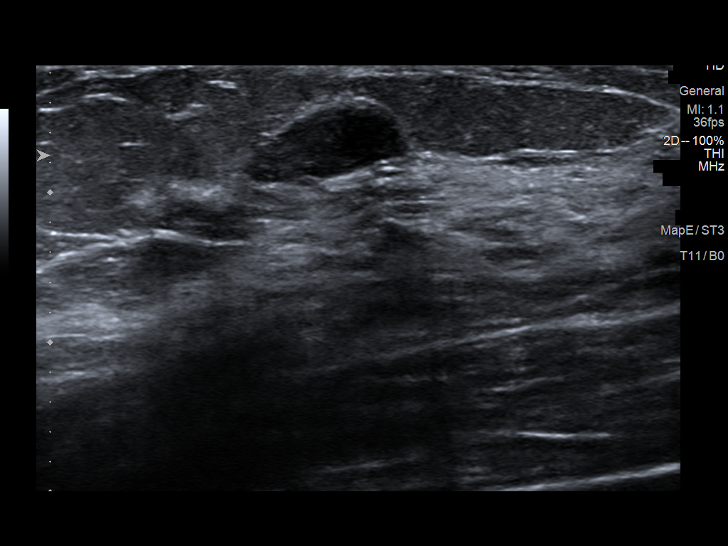
[im 5/10]
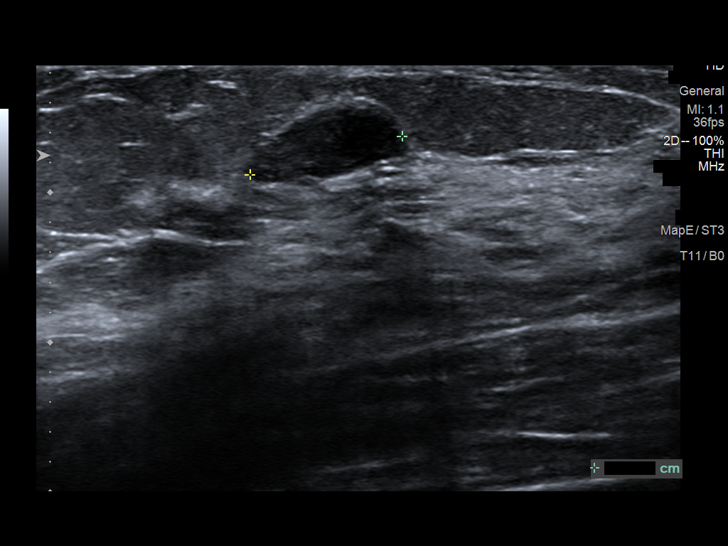
[im 6/10]
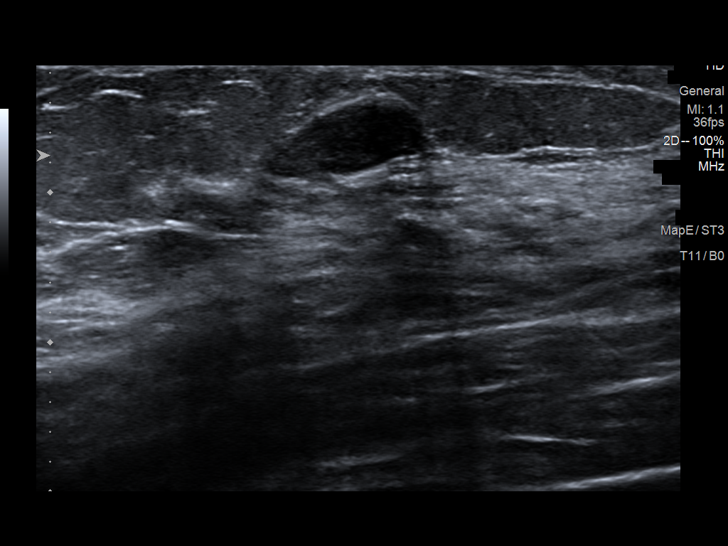
[im 7/10]
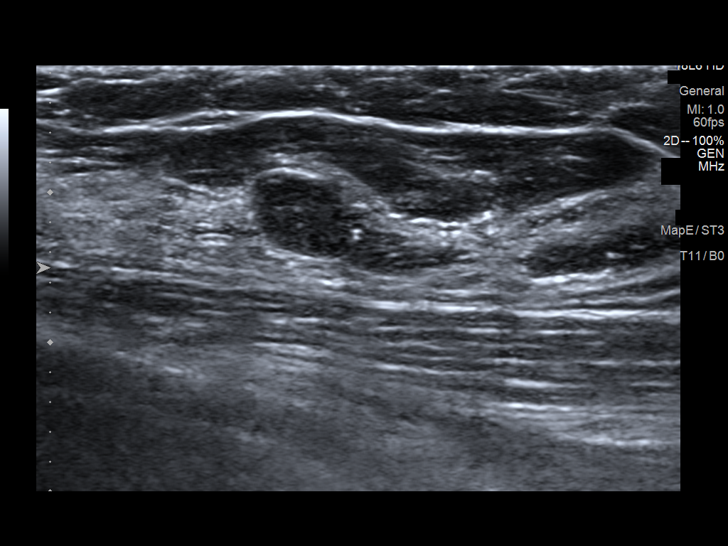
[im 8/10]
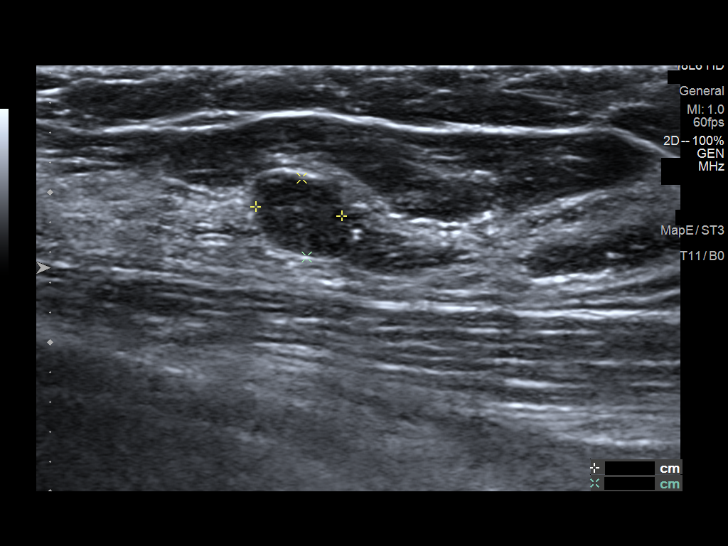
[im 9/10]
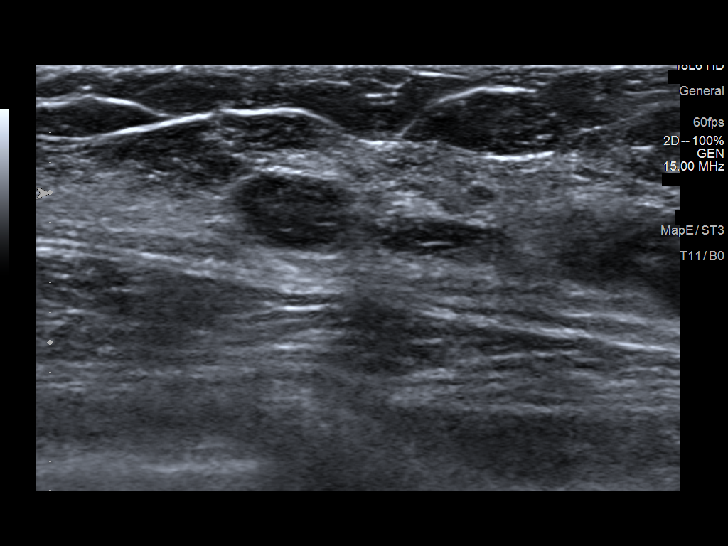
[im 10/10]
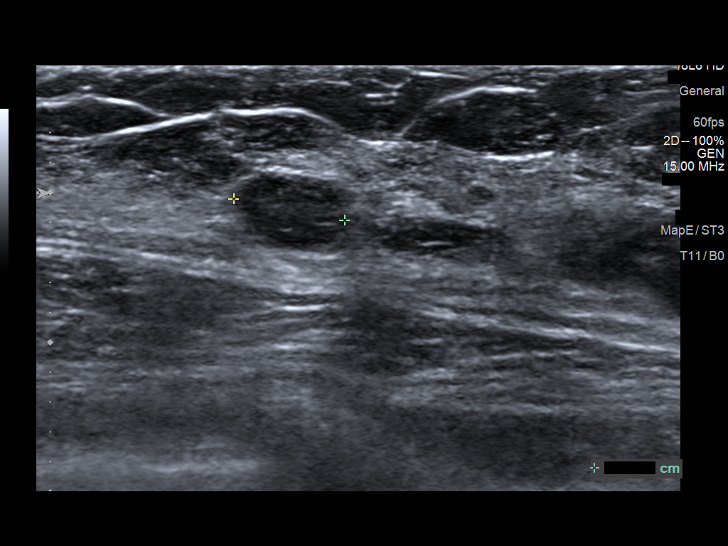

[10 of 10 positions shown; findings below may reference images not displayed]

FINDINGS: Targeted ultrasound is performed, showing 2 circumscribed oval
hypoechoic masses in the left breast. 1 lies at 1 o'clock, 6 cm the
nipple, measuring 11 x 5 x 8 mm, stable since 08/23/2017. The other
lies at 11 o'clock, 4 cm the nipple, measuring 8 x 5 x 6 mm, stable
since 10/04/2018.
IMPRESSION: 1. Two probably benign masses in the left breast, 1 stable since
08/23/2017 the other stable since 10/04/2018.

RECOMMENDATION:
1. Left breast ultrasound in 6 months, at which time the mass at 1
o'clock will have been evaluated for 2 years.

I have discussed the findings and recommendations with the patient.
If applicable, a reminder letter will be sent to the patient
regarding the next appointment.

BI-RADS CATEGORY  3: Probably benign.

## 2019-10-09 NOTE — Telephone Encounter (Signed)
Forms have been signed and emailed to patient by Jennette Banker.

## 2019-11-25 ENCOUNTER — Other Ambulatory Visit: Payer: BC Managed Care – PPO

## 2019-12-23 ENCOUNTER — Other Ambulatory Visit: Payer: Self-pay

## 2019-12-23 ENCOUNTER — Ambulatory Visit: Payer: BC Managed Care – PPO | Admitting: Family Medicine

## 2019-12-23 ENCOUNTER — Encounter: Payer: Self-pay | Admitting: Family Medicine

## 2019-12-23 DIAGNOSIS — R229 Localized swelling, mass and lump, unspecified: Secondary | ICD-10-CM | POA: Insufficient documentation

## 2019-12-23 NOTE — Patient Instructions (Signed)
I think this will continue to improve on it's own.  Let me know if having any worsening symptoms.

## 2019-12-23 NOTE — Progress Notes (Signed)
Tina [name] - 34 y.o. female MRN [id number]  Date of birth: 03-05-1986  Subjective Chief Complaint  Patient presents with  . Mass    HPI Tina [name] is a 34 y.o. female here today with complaint of nodule in groin area.  First noticed last week. Had some tenderness over area which has pretty much resolved at this point.  She does have some mild pain with direct pressure still.  She has history of having several subcutaneous nodules removed, told these were cysts in the past.  She denies any other associated symptoms including fever, chills, urinary pain or urgency, vaginal discharge or bleeding, ulcerations, or pain with intercourse.   ROS:  A comprehensive ROS was completed and negative except as noted per HPI  Allergies  Allergen Reactions  . Banana Other (See Comments)    Throat swelling  . Benadryl [Diphenhydramine Hcl]     Past Medical History:  Diagnosis Date  . Allergy   . Asthma   . Heart murmur     Past Surgical History:  Procedure Laterality Date  . BREAST EXCISIONAL BIOPSY Left 2007  . COLPOSCOPY    . LAPAROSCOPY    . WISDOM TOOTH EXTRACTION      Social History   Socioeconomic History  . Marital status: Single    Spouse name: Not on file  . Number of children: Not on file  . Years of education: Not on file  . Highest education level: Not on file  Occupational History  . Not on file  Tobacco Use  . Smoking status: Former Smoker    Types: E-cigarettes  . Smokeless tobacco: Never Used  Vaping Use  . Vaping Use: Never used  Substance and Sexual Activity  . Alcohol use: Yes    Comment: occ  . Drug use: No  . Sexual activity: Yes    Birth control/protection: Condom  Other Topics Concern  . Not on file  Social History Narrative  . Not on file   Social Determinants of Health   Financial Resource Strain: Low Risk   . Difficulty of Paying Living Expenses: Not hard at all  Food Insecurity: No Food Insecurity  . Worried About Programme researcher, broadcasting/film/video in  the Last Year: Never true  . Ran Out of Food in the Last Year: Never true  Transportation Needs: No Transportation Needs  . Lack of Transportation (Medical): No  . Lack of Transportation (Non-Medical): No  Physical Activity:   . Days of Exercise per Week:   . Minutes of Exercise per Session:   Stress:   . Feeling of Stress :   Social Connections:   . Frequency of Communication with Friends and Family:   . Frequency of Social Gatherings with Friends and Family:   . Attends Religious Services:   . Active Member of Clubs or Organizations:   . Attends Banker Meetings:   Marland Kitchen Marital Status:     Family History  Problem Relation Age of Onset  . Diabetes Mother   . Hypertension Father   . Asthma Father   . Diabetes Father   . Cancer Maternal Aunt        colon  . Lupus Maternal Aunt   . Diabetes Maternal Aunt   . Breast cancer Maternal Grandmother   . Breast cancer Paternal Grandmother   . Diabetes Paternal Grandmother   . Hypertension Maternal Aunt   . Diabetes Maternal Aunt   . Diabetes Maternal Aunt   . Allergic rhinitis Sister   .  Food Allergy Sister   . Eczema Sister   . Asthma Maternal Uncle   . Diabetes Paternal Grandfather     Health Maintenance  Topic Date Due  . Hepatitis C Screening  Never done  . HIV Screening  Never done  . PAP SMEAR-Modifier  Never done  . COVID-19 Vaccine (1) 02/08/2020 (Originally 02/22/1999)  . TETANUS/TDAP  09/03/2020 (Originally 02/21/2006)  . INFLUENZA VACCINE  02/08/2020     ----------------------------------------------------------------------------------------------------------------------------------------------------------------------------------------------------------------- Physical Exam BP 127/79 (BP Location: Left Arm, Patient Position: Sitting, Cuff Size: Small)   Pulse 66   Temp 98.3 F (36.8 C) (Oral)   Wt 114 lb 1.6 oz (51.8 kg)   SpO2 100%   BMI 23.05 kg/m   Physical Exam Constitutional:       Appearance: Normal appearance.  Musculoskeletal:     Cervical back: Neck supple.  Skin:    Comments: Small, soft subcutaneous mobile mass in R groin area. Non-tender.  No fluctuance or induration.   Neurological:     General: No focal deficit present.     Mental Status: She is alert.  Psychiatric:        Mood and Affect: Mood normal.        Behavior: Behavior normal.     ------------------------------------------------------------------------------------------------------------------------------------------------------------------------------------------------------------------- Assessment and Plan  Subcutaneous mass Areas appears to be small cyst vs lipoma.  Has history of several cysts being excised.  Does not have typical rubbery feel of lymph node and no other symptoms suggestive of this.  Recommend monitoring for now, I think this will continue to improve on it's own.  She will let me know if having any worsening symptoms.    No orders of the defined types were placed in this encounter.   No follow-ups on file.    This visit occurred during the SARS-CoV-2 public health emergency.  Safety protocols were in place, including screening questions prior to the visit, additional usage of staff PPE, and extensive cleaning of exam room while observing appropriate contact time as indicated for disinfecting solutions.

## 2019-12-23 NOTE — Progress Notes (Signed)
Pt states on Friday, 12/19/19 she started to have pain in her right groin area. Following was a lump. Now the pain has subsided unless pressing directly on the lump. Lump is moveable and has shifted.   Pt has a history of cysts and cyst removal.

## 2019-12-23 NOTE — Assessment & Plan Note (Addendum)
Areas appears to be small cyst vs lipoma.  Has history of several cysts being excised.  Does not have typical rubbery feel of lymph node and no other symptoms suggestive of this.  Recommend monitoring for now, I think this will continue to improve on it's own.  She will let me know if having any worsening symptoms.

## 2020-01-09 ENCOUNTER — Other Ambulatory Visit: Payer: Self-pay

## 2020-01-09 ENCOUNTER — Ambulatory Visit
Admission: RE | Admit: 2020-01-09 | Discharge: 2020-01-09 | Disposition: A | Discharge status: Home / Self Care | Payer: BC Managed Care – PPO | Source: Ambulatory Visit | Attending: Obstetrics and Gynecology | Admitting: Obstetrics and Gynecology

## 2020-01-09 DIAGNOSIS — N63 Unspecified lump in unspecified breast: Secondary | ICD-10-CM

## 2020-01-30 IMAGING — DX DG CHEST 1V PORT
1 series · 1 of 1 positions shown · non-contrast
Comparison: 04/06/2019

CLINICAL DATA: Chest pain

EXAM:
PORTABLE CHEST 1 VIEW

[chest ap]
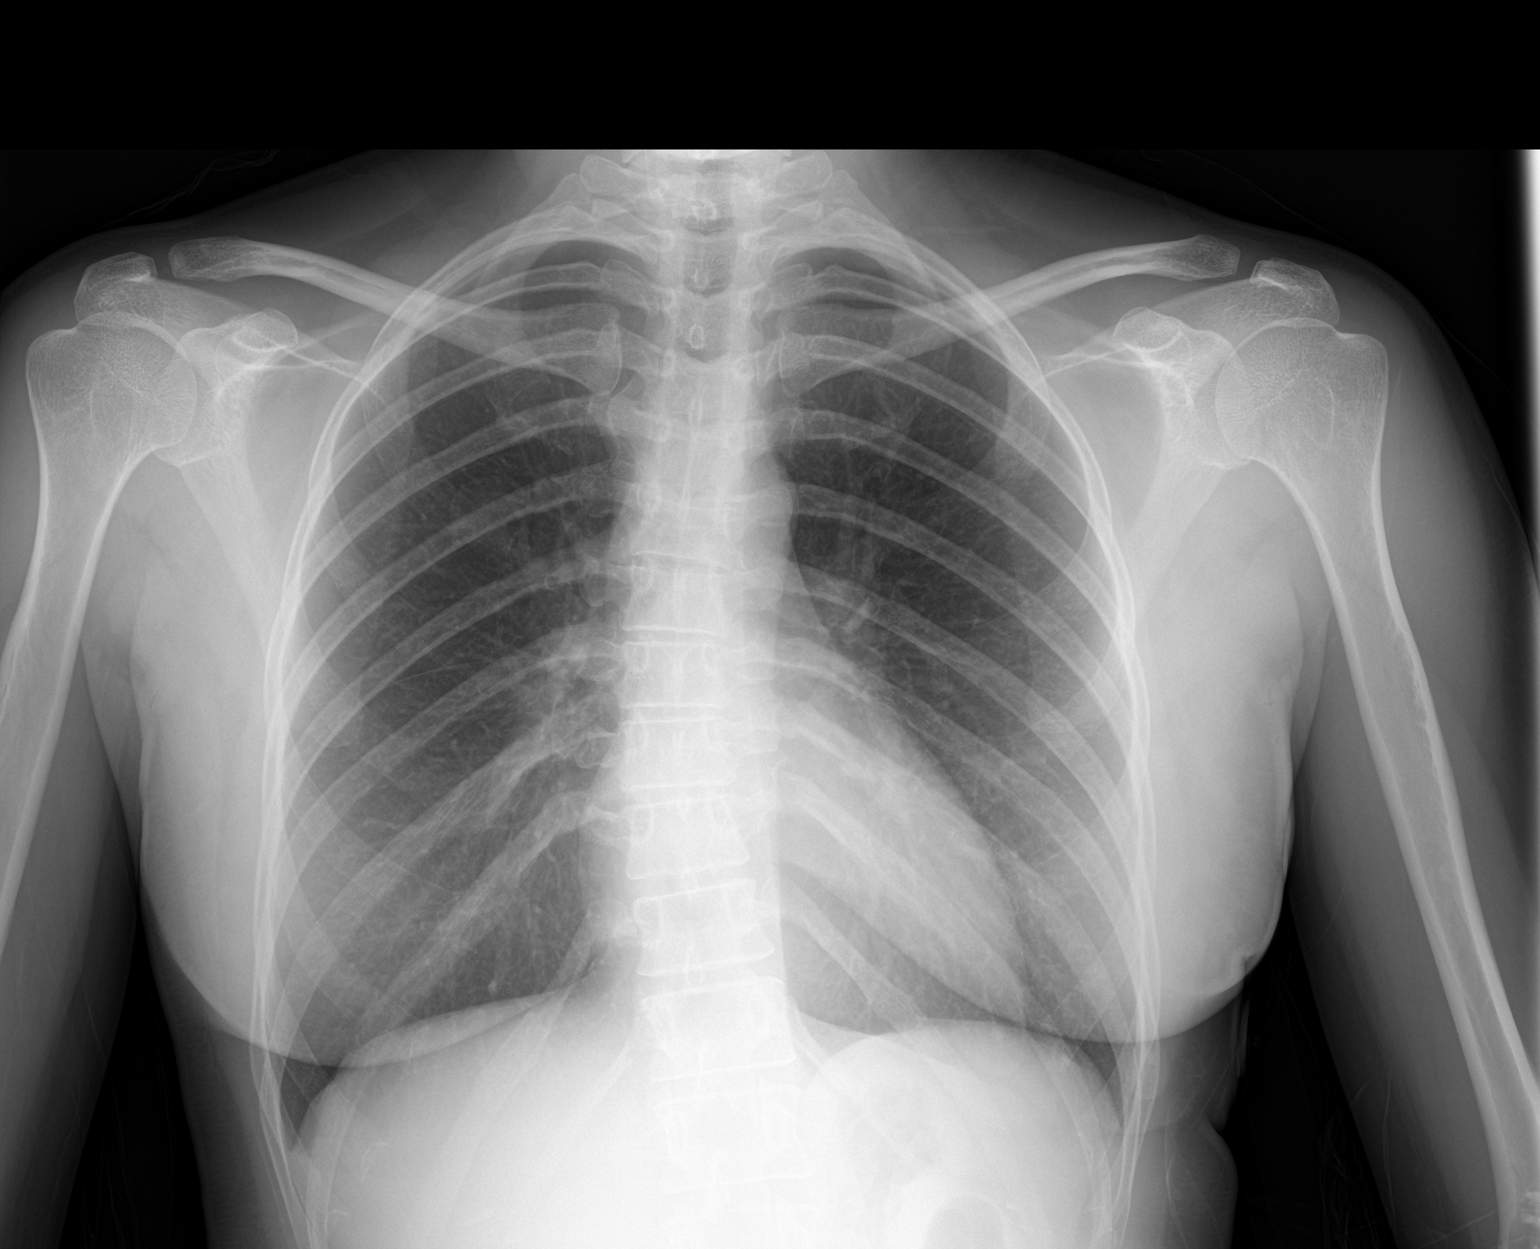

[1 of 1 positions shown; findings below may reference images not displayed]

FINDINGS: The heart size and mediastinal contours are within normal limits.
Both lungs are clear. The visualized skeletal structures are
unremarkable.
IMPRESSION: No active disease.

## 2020-03-11 LAB — OB RESULTS CONSOLE RUBELLA ANTIBODY, IGM: Rubella: IMMUNE

## 2020-03-11 LAB — OB RESULTS CONSOLE RPR: RPR: NONREACTIVE

## 2020-03-11 LAB — OB RESULTS CONSOLE HIV ANTIBODY (ROUTINE TESTING): HIV: NONREACTIVE

## 2020-03-11 LAB — OB RESULTS CONSOLE HEPATITIS B SURFACE ANTIGEN: Hepatitis B Surface Ag: NEGATIVE

## 2020-03-11 LAB — HEPATITIS C ANTIBODY: HCV Ab: NEGATIVE

## 2020-04-28 ENCOUNTER — Encounter: Payer: Self-pay | Admitting: Family Medicine

## 2020-04-28 ENCOUNTER — Ambulatory Visit (INDEPENDENT_AMBULATORY_CARE_PROVIDER_SITE_OTHER): Payer: Medicaid Other | Admitting: Family Medicine

## 2020-04-28 ENCOUNTER — Other Ambulatory Visit: Payer: Self-pay

## 2020-04-28 VITALS — BP 112/68 | HR 88

## 2020-04-28 DIAGNOSIS — R000 Tachycardia, unspecified: Secondary | ICD-10-CM

## 2020-04-28 DIAGNOSIS — R011 Cardiac murmur, unspecified: Secondary | ICD-10-CM | POA: Diagnosis not present

## 2020-04-28 NOTE — Progress Notes (Signed)
Tina [name] - 34 y.o. female MRN [id number]  Date of birth: 1986/06/18  Subjective Chief Complaint  Patient presents with  . Elevated heart rate    HPI Tina [name] is a 34 y.o. female here today to discuss heart murmur.  She has long history of heart murmur, denies any symptoms previously.  Has never had any previous work up for this.  She is currently [redacted] weeks pregnant and her OB requested that she follow up with [place] to discuss this.  She has had some intermittent palpitations and elevated heart rate.  She denies chest pain, shortness of breath, dizziness, or significant fatigue.    ROS:  A comprehensive ROS was completed and negative except as noted per HPI  Allergies  Allergen Reactions  . Banana Other (See Comments)    Throat swelling  . Benadryl [Diphenhydramine Hcl]     Past Medical History:  Diagnosis Date  . Allergy   . Asthma   . Heart murmur     Past Surgical History:  Procedure Laterality Date  . BREAST EXCISIONAL BIOPSY Left 2007  . COLPOSCOPY    . LAPAROSCOPY    . WISDOM TOOTH EXTRACTION      Social History   Socioeconomic History  . Marital status: Single    Spouse name: Not on file  . Number of children: Not on file  . Years of education: Not on file  . Highest education level: Not on file  Occupational History  . Not on file  Tobacco Use  . Smoking status: Former Smoker    Types: E-cigarettes  . Smokeless tobacco: Never Used  Vaping Use  . Vaping Use: Never used  Substance and Sexual Activity  . Alcohol use: Yes    Comment: occ  . Drug use: No  . Sexual activity: Yes    Birth control/protection: Condom  Other Topics Concern  . Not on file  Social History Narrative  . Not on file   Social Determinants of Health   Financial Resource Strain:   . Difficulty of Paying Living Expenses: Not on file  Food Insecurity:   . Worried About Programme researcher, broadcasting/film/video in the Last Year: Not on file  . Ran Out of Food in the Last Year: Not on file   Transportation Needs:   . Lack of Transportation (Medical): Not on file  . Lack of Transportation (Non-Medical): Not on file  Physical Activity:   . Days of Exercise per Week: Not on file  . Minutes of Exercise per Session: Not on file  Stress:   . Feeling of Stress : Not on file  Social Connections:   . Frequency of Communication with Friends and Family: Not on file  . Frequency of Social Gatherings with Friends and Family: Not on file  . Attends Religious Services: Not on file  . Active Member of Clubs or Organizations: Not on file  . Attends Banker Meetings: Not on file  . Marital Status: Not on file    Family History  Problem Relation Age of Onset  . Diabetes Mother   . Hypertension Father   . Asthma Father   . Diabetes Father   . Cancer Maternal Aunt        colon  . Lupus Maternal Aunt   . Diabetes Maternal Aunt   . Breast cancer Maternal Grandmother   . Breast cancer Paternal Grandmother   . Diabetes Paternal Grandmother   . Hypertension Maternal Aunt   . Diabetes Maternal Aunt   .  Diabetes Maternal Aunt   . Allergic rhinitis Sister   . Food Allergy Sister   . Eczema Sister   . Asthma Maternal Uncle   . Diabetes Paternal Grandfather     Health Maintenance  Topic Date Due  . Hepatitis C Screening  Never done  . COVID-19 Vaccine (1) Never done  . HIV Screening  Never done  . PAP SMEAR-Modifier  Never done  . INFLUENZA VACCINE  Never done  . TETANUS/TDAP  09/03/2020 (Originally 02/21/2006)     ----------------------------------------------------------------------------------------------------------------------------------------------------------------------------------------------------------------- Physical Exam BP 112/68 (BP Location: Left Arm, Patient Position: Sitting, Cuff Size: Small)   Pulse 88   Physical Exam Constitutional:      Appearance: Normal appearance.  HENT:     Head: Normocephalic and atraumatic.  Eyes:     General: No  scleral icterus. Cardiovascular:     Rate and Rhythm: Normal rate and regular rhythm.     Heart sounds: Murmur (2-3/6 systolic murmur) heard.   Pulmonary:     Effort: Pulmonary effort is normal.     Breath sounds: Normal breath sounds.  Musculoskeletal:     Cervical back: Neck supple.  Skin:    General: Skin is warm and dry.  Neurological:     General: No focal deficit present.     Mental Status: She is alert.  Psychiatric:        Mood and Affect: Mood normal.        Behavior: Behavior normal.     ------------------------------------------------------------------------------------------------------------------------------------------------------------------------------------------------------------------- Assessment and Plan  Cardiac murmur EKG completed today with NSR.  Normal PR and QTc.   No signs of hypertrophy Discussed that often times murmurs worsen with pregnancy due to volume changes Echo ordered as well with her tachycardia to be sure no structural abnormalities, although I think this is unlikely as she has been asymptomatic up until now.     No orders of the defined types were placed in this encounter.   No follow-ups on file.    This visit occurred during the SARS-CoV-2 public health emergency.  Safety protocols were in place, including screening questions prior to the visit, additional usage of staff PPE, and extensive cleaning of exam room while observing appropriate contact time as indicated for disinfecting solutions.

## 2020-04-28 NOTE — Assessment & Plan Note (Addendum)
EKG completed today with NSR.  Normal PR and QTc.   No signs of hypertrophy Discussed that often times murmurs worsen with pregnancy due to volume changes Echo ordered as well with her tachycardia to be sure no structural abnormalities, although I think this is unlikely as she has been asymptomatic up until now.

## 2020-04-28 NOTE — Patient Instructions (Signed)
It is not abnormal for heart murmurs to increase during pregnancy.  Your EKG is reassuring.   I have ordered an Echocardiogram to be sure everything is structurally normal with the heart.

## 2020-05-13 ENCOUNTER — Telehealth: Payer: Self-pay

## 2020-05-13 NOTE — Telephone Encounter (Signed)
Tina [name] called because she has not heard from a Cardiologist or had ECHO scheduled since 10/20 visit.  No order for ECHO iorder or Cardiology referral has been placed.   Please advise.

## 2020-05-13 NOTE — Telephone Encounter (Signed)
Echo ordered at previous visit.  I don't think she needs to see cardiology unless echo is abnormal or she develops significant symptoms.

## 2020-05-13 NOTE — Telephone Encounter (Signed)
ECHO pending insurance approval.   Tina [name] of the process. She will try calling insurance to speed them up.  Apologized for the delay.

## 2020-06-09 ENCOUNTER — Ambulatory Visit (HOSPITAL_BASED_OUTPATIENT_CLINIC_OR_DEPARTMENT_OTHER)
Admission: RE | Admit: 2020-06-09 | Discharge: 2020-06-09 | Disposition: A | Discharge status: Home / Self Care | Payer: Medicaid Other | Source: Ambulatory Visit | Attending: Family Medicine | Admitting: Family Medicine

## 2020-06-09 DIAGNOSIS — R000 Tachycardia, unspecified: Secondary | ICD-10-CM | POA: Diagnosis not present

## 2020-06-09 DIAGNOSIS — R011 Cardiac murmur, unspecified: Secondary | ICD-10-CM | POA: Insufficient documentation

## 2020-06-09 LAB — ECHOCARDIOGRAM COMPLETE
Area-P 1/2: 2.93 cm2
S' Lateral: 2.43 cm

## 2020-06-11 ENCOUNTER — Telehealth: Payer: Self-pay | Admitting: Family Medicine

## 2020-06-11 NOTE — Telephone Encounter (Signed)
Pt, is expecting and ask for what is safe to take for congestion, runny nose until she can be seen on Tue.

## 2020-06-11 NOTE — Telephone Encounter (Signed)
She is 7 months overdue for an office visit. Can you please have her call OB/GYN for advice on medications that are safe during pregnancy? Thank you

## 2020-06-11 NOTE — Telephone Encounter (Signed)
Please advise. Thank you

## 2020-06-11 NOTE — Telephone Encounter (Signed)
Called and informed patient about asking her OB/GYN about medications to take and she stated she already has asked but they gave her a very small list of things. She has an appointment scheduled with our office Tuesday December 7th and she will inquire about it then.

## 2020-06-15 ENCOUNTER — Other Ambulatory Visit: Payer: Self-pay

## 2020-06-15 ENCOUNTER — Encounter: Payer: Self-pay | Admitting: Family

## 2020-06-15 ENCOUNTER — Ambulatory Visit (INDEPENDENT_AMBULATORY_CARE_PROVIDER_SITE_OTHER): Payer: Medicaid Other | Admitting: Family

## 2020-06-15 VITALS — BP 122/70 | HR 109 | Temp 99.2°F | Resp 20 | Ht 60.0 in | Wt 131.0 lb

## 2020-06-15 DIAGNOSIS — J4530 Mild persistent asthma, uncomplicated: Secondary | ICD-10-CM

## 2020-06-15 DIAGNOSIS — J3089 Other allergic rhinitis: Secondary | ICD-10-CM

## 2020-06-15 DIAGNOSIS — T7800XD Anaphylactic reaction due to unspecified food, subsequent encounter: Secondary | ICD-10-CM

## 2020-06-15 DIAGNOSIS — K219 Gastro-esophageal reflux disease without esophagitis: Secondary | ICD-10-CM | POA: Diagnosis not present

## 2020-06-15 MED ORDER — BUDESONIDE 32 MCG/ACT NA SUSP: NASAL | 5 refills | Status: DC

## 2020-06-15 MED ORDER — EPINEPHRINE 0.3 MG/0.3ML IJ SOAJ: 0.3000 mg | Freq: Once | INTRAMUSCULAR | 1 refills | Status: AC

## 2020-06-15 MED ORDER — LORATADINE 10 MG PO TABS: 10.0000 mg | ORAL_TABLET | Freq: Every day | ORAL | 5 refills | Status: DC | PRN

## 2020-06-15 MED ORDER — PULMICORT FLEXHALER 180 MCG/ACT IN AEPB: 2.0000 | INHALATION_SPRAY | Freq: Two times a day (BID) | RESPIRATORY_TRACT | 5 refills | Status: DC

## 2020-06-15 MED ORDER — ALBUTEROL SULFATE HFA 108 (90 BASE) MCG/ACT IN AERS: INHALATION_SPRAY | RESPIRATORY_TRACT | 1 refills | Status: DC

## 2020-06-15 NOTE — Patient Instructions (Addendum)
Mild persistent asthma Stop Flovent 110 mcg due to pregnancy Start Pulmicort Flexihaler 180 mcg 2 puffs twice a day. Please let us know if this medication is not helping with your symptoms. It is very important that your asthma be under good control while pregnant. May use albuterol 2 puffs every 4 hours as needed for cough, wheeze, tightness in chest, or shortness of breath.  Seasonal and perennial allergic rhinitis Start Claritin 10 mg as per OBGYN once a day as needed for runny nose. Stop fluticasone nasal spray  Start Rhinocort nasal spray 1-2 sprays each nostril once a day as needed for stuffy nose. This is safe to use while pregnant.  Gastroesophageal reflux disease Continue dietary and lifestyle modifications  Food allergy  Continue to avoid banana. Refill of epinephrine auto-injector  Will defer to your OBGYN concerning COVID-19 vaccine during pregnancy  Please let us know if this treatment plan is not working well for you Schedule a follow-up appointment in 4 weeks or sooner if needed

## 2020-06-15 NOTE — Progress Notes (Addendum)
[address] [place] [address] Dept: [phone]  FOLLOW UP NOTE  Patient ID: Tina [name], female    DOB: [birth year]  Age: 34 y.o. MRN: [id number] Date of Office Visit: 06/15/2020  Assessment  Chief Complaint: Asthma  HPI Tina [name] is a 34 year old female who presents today for follow-up of mild persistent asthma, seasonal and perennial allergic rhinitis, gastroesophageal reflux disease, and food allergy.  She was last seen on July 23, 2019 by Dr. Nunzio Cobbs.  She is currently 5 months pregnant.  Mild persistent asthma is reported as moderately controlled with Flovent 110 mcg 2 puffs twice a day with a spacer.  She has not been able to find her albuterol inhaler for a while.  Since last Wednesday she reports a productive cough with clear thick sputum, wheezing at night, occasional nocturnal awakenings due to her cough and some shortness of breath that she attributes to her pregnancy.  Since her last office visit she has not required any systemic steroids or made any trips to the emergency room or urgent care due to to breathing problems.  Discussed the importance of making sure that her asthma is under tight control while she is pregnant and to call our office and let us know if it is not.  Seasonal and perennial allergic rhinitis is reported as moderately controlled with fluticasone nasal spray as needed. She stopped levocetirizine at the recommendation of her OBGYN.  She reports that her OB/GYN recommended Claritin, but she has not started this yet.  She reports clear rhinorrhea, nasal congestion in the morning and postnasal drip in the morning fades away as the day goes on.  She also reports that this morning she felt her left ear being clogged.  She denies any sinus tenderness fever or chills.  Gastroesophageal reflux disease is reported better since becoming pregnant.  She she was instructed to stop her omeprazole 20 mg due to her pregnancy by her OB/GYN.  She continues to  avoid banana without any accidental ingestion.  She reports that she never received her epinephrine autoinjector device.  Instructed her to let us know if she does not receive today's prescription.    Drug Allergies:  Allergies  Allergen Reactions  . Banana Other (See Comments)    Throat swelling  . Benadryl [Diphenhydramine Hcl]     Review of Systems: Review of Systems  Constitutional: Negative for chills and fever.  HENT:       Reports clear thick rhinorrhea, post nasal drip and nasal congestion in the morning. Denies sinus tenderness, fever, or chills  Eyes:       Denies itchy watery eyes  Respiratory: Positive for cough, shortness of breath and wheezing.   Cardiovascular: Negative for chest pain and palpitations.  Gastrointestinal: Negative for abdominal pain and heartburn.  Genitourinary: Negative for dysuria.  Skin: Negative for itching and rash.  Neurological: Negative for headaches.  Endo/Heme/Allergies: Positive for environmental allergies.    Physical Exam: BP 122/70   Pulse (!) 109   Temp 99.2 F (37.3 C) (Tympanic)   Resp 20   Ht 5' (1.524 m)   Wt 131 lb (59.4 kg)   SpO2 100%   BMI 25.58 kg/m    Physical Exam Constitutional:      Appearance: Normal appearance.  HENT:     Head: Normocephalic and atraumatic.     Comments: Pharynx normal. Eyes normal. Ears normal. Nose moderately edematous and slightly erythematous with no drainage noted    Right Ear: Tympanic membrane, ear  canal and external ear normal.     Left Ear: Tympanic membrane, ear canal and external ear normal.     Mouth/Throat:     Mouth: Mucous membranes are moist.     Pharynx: Oropharynx is clear.  Eyes:     Conjunctiva/sclera: Conjunctivae normal.  Cardiovascular:     Rate and Rhythm: Regular rhythm.     Heart sounds: Normal heart sounds.  Pulmonary:     Effort: Pulmonary effort is normal.     Breath sounds: Normal breath sounds.     Comments: Lungs clear to auscultation  bilaterally Musculoskeletal:     Cervical back: Neck supple.  Skin:    General: Skin is warm.  Neurological:     Mental Status: She is alert and oriented to person, place, and time.  Psychiatric:        Mood and Affect: Mood normal.        Behavior: Behavior normal.        Thought Content: Thought content normal.        Judgment: Judgment normal.    Diagnostics: FVC 2.51 L, FEV1 2.14 L.  Predicted FVC 2.73 L, FEV1 2.32 L.  Spirometry indicates normal ventilatory function.  Assessment and Plan: 1. Mild persistent asthma without complication   2. Seasonal and perennial allergic rhinitis   3. Anaphylactic shock due to food, subsequent encounter   4. Gastroesophageal reflux disease, unspecified whether esophagitis present     Meds ordered this encounter  Medications  . albuterol (VENTOLIN HFA) 108 (90 Base) MCG/ACT inhaler    Sig: 2 puffs every 4-6 hours as needed for coughing or wheezing spells    Dispense:  18 g    Refill:  1  . budesonide (PULMICORT FLEXHALER) 180 MCG/ACT inhaler    Sig: Inhale 2 puffs into the lungs 2 (two) times daily.    Dispense:  1 each    Refill:  5  . budesonide (RHINOCORT AQUA) 32 MCG/ACT nasal spray    Sig: Use 2 sprays in each nostril once a day as needed for stuffy nose.    Dispense:  5 mL    Refill:  5  . loratadine (CLARITIN) 10 MG tablet    Sig: Take 1 tablet (10 mg total) by mouth daily as needed for allergies.    Dispense:  30 tablet    Refill:  5  . EPINEPHrine 0.3 mg/0.3 mL IJ SOAJ injection    Sig: Inject 0.3 mg into the muscle once for 1 dose.    Dispense:  1 each    Refill:  1    Dispense mylan brand or generic only    Patient Instructions  Mild persistent asthma Stop Flovent 110 mcg due to pregnancy Start Pulmicort Flexihaler 180 mcg 2 puffs twice a day. Please let us know if this medication is not helping with your symptoms. It is very important that your asthma be under good control while pregnant. May use albuterol 2 puffs  every 4 hours as needed for cough, wheeze, tightness in chest, or shortness of breath.  Seasonal and perennial allergic rhinitis Start Claritin 10 mg as per OBGYN once a day as needed for runny nose. Stop fluticasone nasal spray  Start Rhinocort nasal spray 1-2 sprays each nostril once a day as needed for stuffy nose. This is safe to use while pregnant.  Gastroesophageal reflux disease Continue dietary and lifestyle modifications  Food allergy  Continue to avoid banana. Refill of epinephrine auto-injector  Will defer to your OBGYN  concerning COVID-19 vaccine during pregnancy  Please let us know if this treatment plan is not working well for you Schedule a follow-up appointment in 4 weeks or sooner if needed   Return in about 4 weeks (around 07/13/2020), or if symptoms worsen or fail to improve.    Thank you for the opportunity to care for this patient.  Please do not hesitate to contact me with questions.  Nehemiah Settle, FNP [facility]  ________________________________________________  I have provided oversight concerning Wynona [facility]es [place]'s evaluation and treatment of this patient's health issues addressed during today's encounter.  I agree with the assessment and therapeutic plan as outlined in the note.   Signed,   R Jorene Guest, MD

## 2020-06-16 ENCOUNTER — Telehealth: Payer: Self-pay | Admitting: Family

## 2020-06-16 NOTE — Telephone Encounter (Signed)
This medication is the only medication that is preferred during pregnancy. Please call insurance company and let them know that she is pregnant. Thank you!

## 2020-06-16 NOTE — Telephone Encounter (Signed)
Pt. States she was only able to get 2 of the 5 prescriptions sent in, the Pulmicort is not covered. Pharmacy gave her a hard time.

## 2020-06-16 NOTE — Telephone Encounter (Signed)
Tina [name] is there another medication you would like to prescribe to her because her insurance will not cover the Pulmicort?

## 2020-06-17 MED ORDER — BUDESONIDE 32 MCG/ACT NA SUSP: NASAL | 5 refills | Status: DC

## 2020-06-17 MED ORDER — PULMICORT FLEXHALER 180 MCG/ACT IN AEPB: 2.0000 | INHALATION_SPRAY | Freq: Two times a day (BID) | RESPIRATORY_TRACT | 5 refills | Status: DC

## 2020-06-17 MED ORDER — EPINEPHRINE 0.3 MG/0.3ML IJ SOAJ: INTRAMUSCULAR | 3 refills | Status: DC

## 2020-06-17 NOTE — Telephone Encounter (Signed)
PA approved.

## 2020-06-17 NOTE — Telephone Encounter (Signed)
Patient informed of Pulmicort approval. Sent in Epi-Pen and Rhinocort AQ to [facility] in [place]-per patient.

## 2020-06-17 NOTE — Addendum Note (Signed)
Addended by: Janie Morning on: 06/17/2020 02:11 PM   Modules accepted: Orders

## 2020-06-17 NOTE — Telephone Encounter (Signed)
PA sent to cover my meds

## 2020-06-17 NOTE — Telephone Encounter (Signed)
Please let her know that it is now approved. Thank you!

## 2020-06-21 ENCOUNTER — Other Ambulatory Visit: Payer: Self-pay

## 2020-06-21 MED ORDER — EPINEPHRINE 0.3 MG/0.3ML IJ SOAJ: INTRAMUSCULAR | 3 refills | Status: DC

## 2020-06-22 ENCOUNTER — Other Ambulatory Visit: Payer: Self-pay | Admitting: *Deleted

## 2020-06-22 MED ORDER — EPINEPHRINE 0.3 MG/0.3ML IJ SOAJ: INTRAMUSCULAR | 1 refills | Status: DC

## 2020-06-23 ENCOUNTER — Other Ambulatory Visit: Payer: Self-pay

## 2020-07-12 IMAGING — US US BREAST*L* LIMITED INC AXILLA
1 series · 9 of 9 positions shown · non-contrast
Comparison: Previous exam(s).

CLINICAL DATA: Patient for follow-up of probably benign left breast
masses. This is a 2 year follow-up of the left breast mass 1 o'clock
position and a 1 year follow-up of the left breast mass 11 o'clock
position.

EXAM:
ULTRASOUND OF THE LEFT BREAST

[Series 1: us breast*left* limited inc axilla · 0.04mm/px · 9 of 9 slices shown]
[im 1/9]
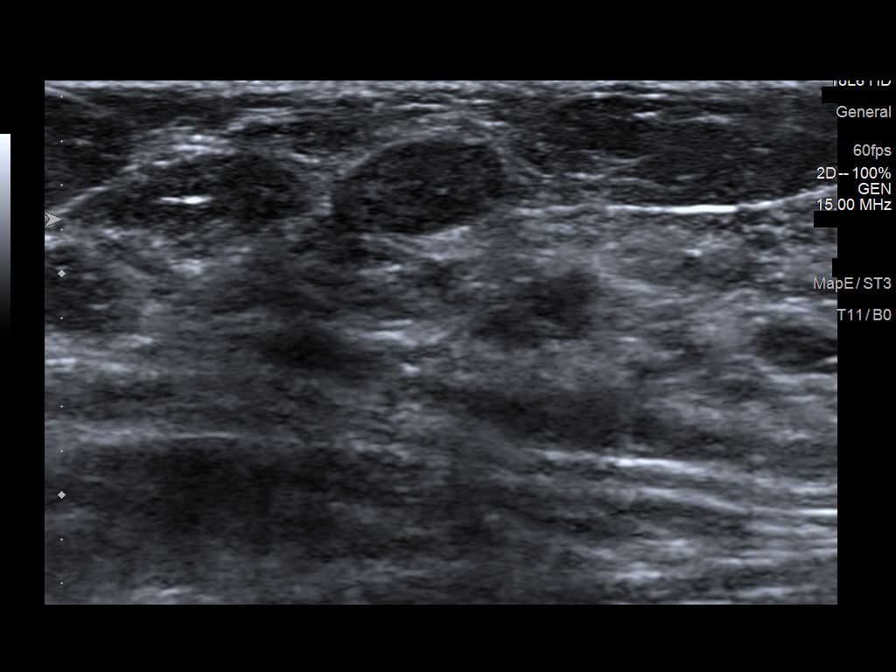
[im 2/9]
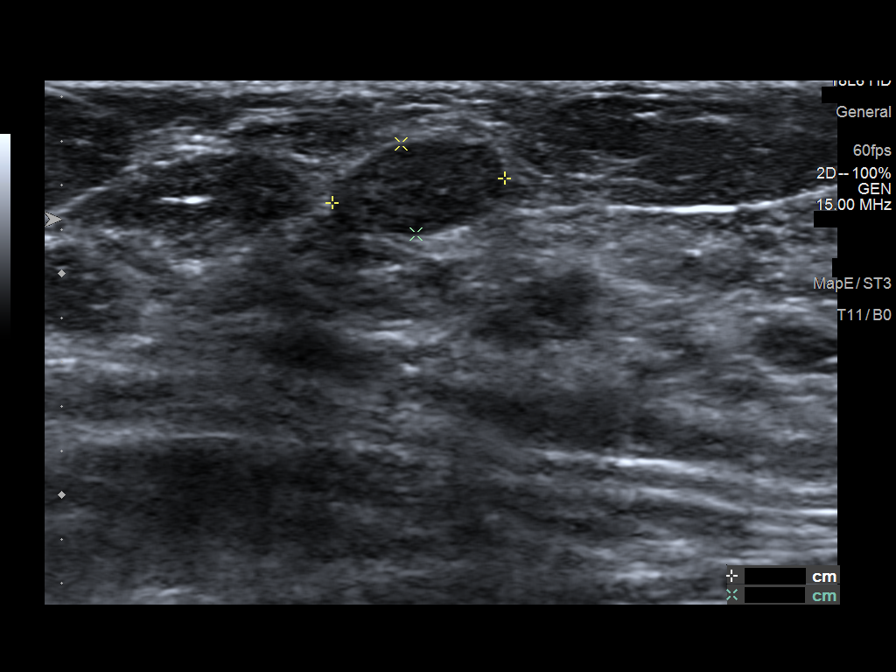
[im 3/9]
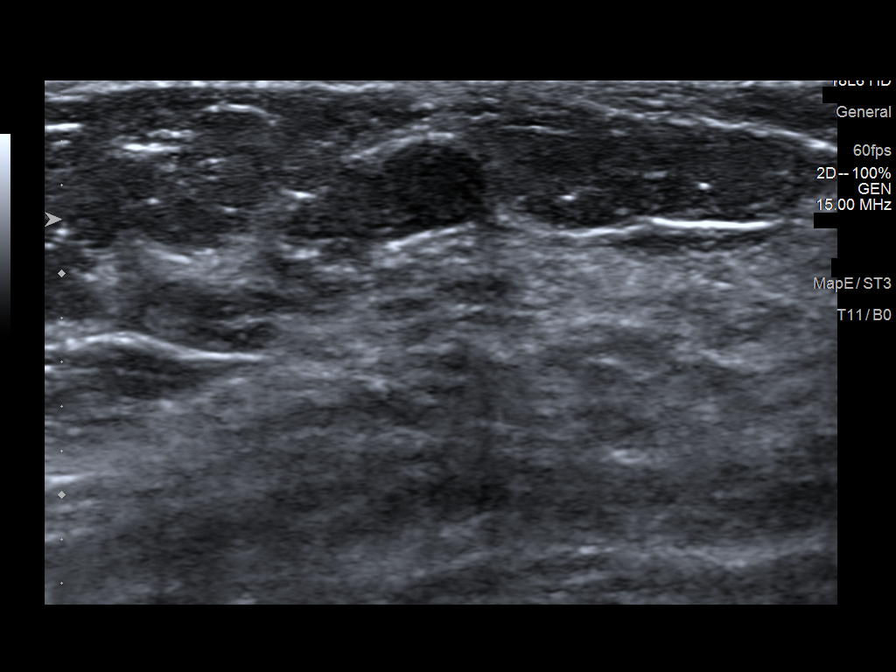
[im 4/9]
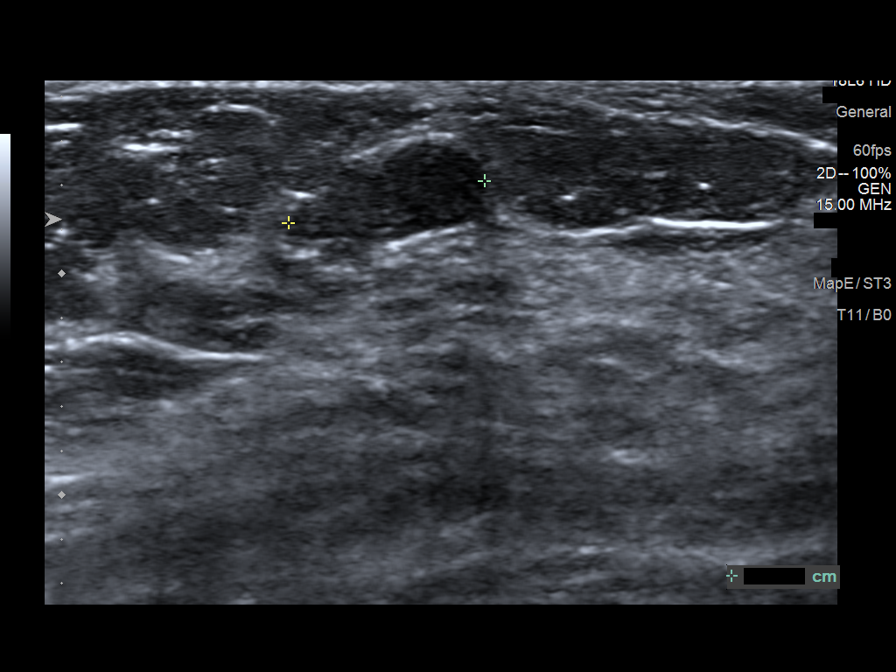
[im 5/9]
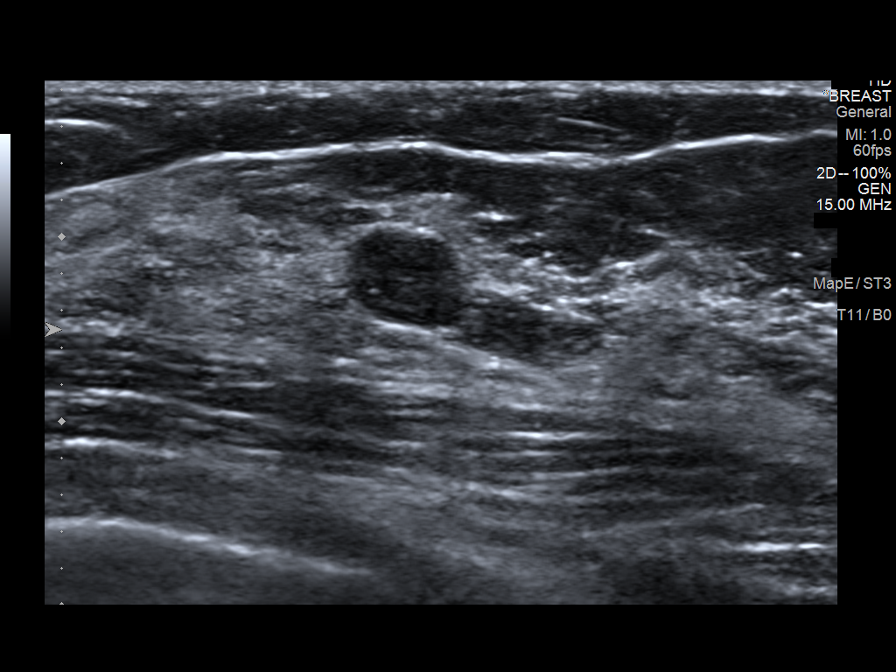
[im 6/9]
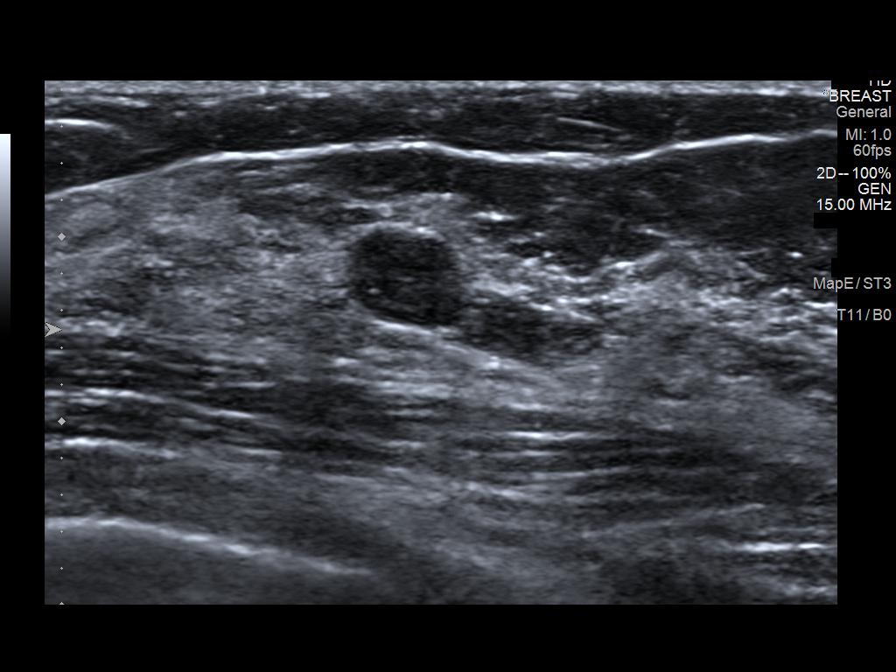
[im 7/9]
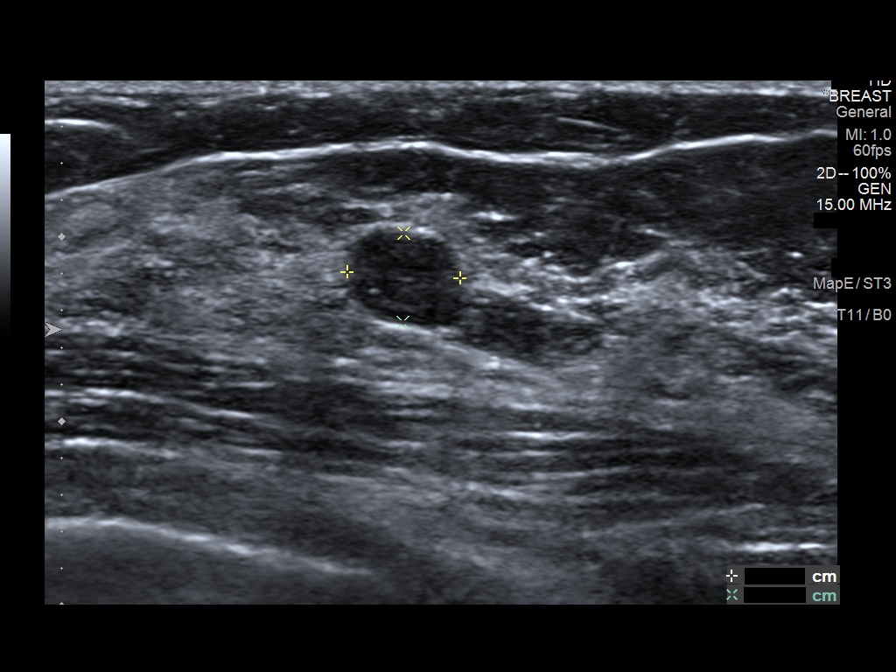
[im 8/9]
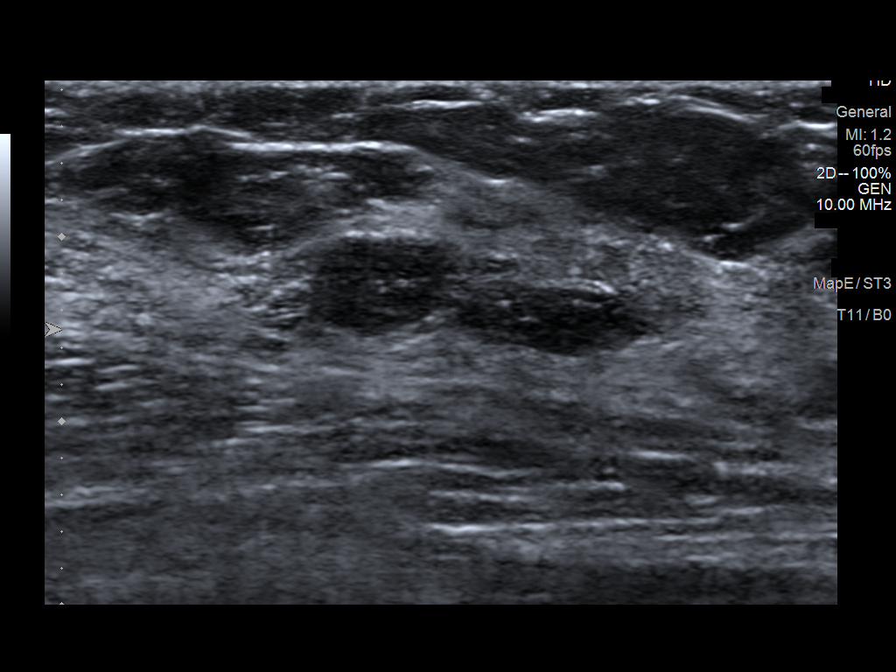
[im 9/9]
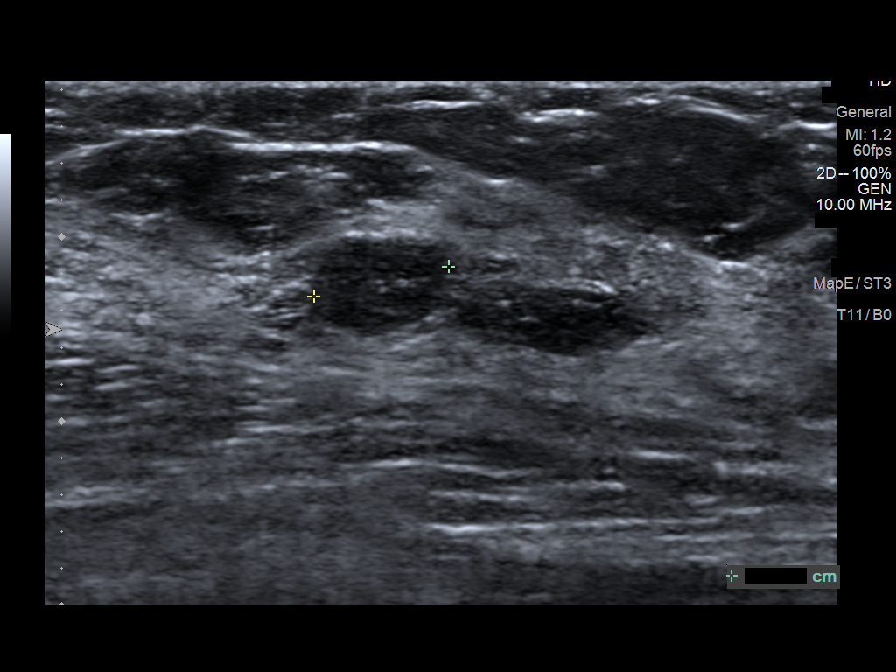

[9 of 9 positions shown; findings below may reference images not displayed]

FINDINGS: Targeted ultrasound is performed, showing a stable 8 x 4 x 9 mm oval
circumscribed hypoechoic mass left breast 1 o'clock position 6 cm
from nipple, compatible with benign process given stability over
time.

Additionally there is a stable 6 x 5 x 8 mm oval circumscribed
hypoechoic mass left breast 11 o'clock position 4 cm from nipple,
stable for 1 year.
IMPRESSION: Stable probably benign left breast mass 11 o'clock position.

Benign left breast mass 1 o'clock position given stability over
time, most compatible with a fibroadenoma.

RECOMMENDATION:
Left breast ultrasound in 12 months to reassess the mass at the 11
o'clock position 4 cm from nipple.

I have discussed the findings and recommendations with the patient.
If applicable, a reminder letter will be sent to the patient
regarding the next appointment.

BI-RADS CATEGORY  3: Probably benign.

## 2020-07-14 NOTE — Patient Instructions (Incomplete)
Mild persistent asthma Continue Pulmicort Flexihaler 180 mcg 2 puffs twice a day to help prevent cough and wheeze. May use albuterol 2 puffs every 4 hours as needed for cough, wheeze, tightness in chest, or shortness of breath.  Seasonal and perennial allergic rhinitis Continue Claritin 10 mg as per OBGYN once a day as needed for runny nose. Continue Rhinocort nasal spray 1-2 sprays each nostril once a day as needed for stuffy nose. This is safe to use while pregnant.  Gastroesophageal reflux disease Continue dietary and lifestyle modifications  Food allergy  Continue to avoid banana. Refill of epinephrine auto-injector   Please let us know if this treatment plan is not working well for you Schedule a follow-up appointment in

## 2020-07-15 ENCOUNTER — Ambulatory Visit: Payer: Medicaid Other | Admitting: Family

## 2020-07-28 NOTE — Patient Instructions (Addendum)
Mild persistent asthma Increase Pulmicort Flexihaler 180 mcg 2 puffs twice a day to help prevent cough and wheeze. Make sure and use this every day. Either set an alarm on your phone twice a day to remember to use Pulmicort or set your inhaler next to your toothbrush and use before you brush your teeth May use albuterol 2 puffs every 4 hours as needed for cough, wheeze, tightness in chest, or shortness of breath.  Seasonal and perennial allergic rhinitis Continue Claritin 10 mg as per OBGYN once a day as needed for runny nose. Stop using Rhinocort nasal spray for a few days  Continue Rhinocort nasal spray 1-2 sprays each nostril once a day as needed for stuffy nose. This is safe to use while pregnant.In the right nostril, point the applicator out toward the right ear. In the left nostril, point the applicator out toward the left ear  Gastroesophageal reflux disease Continue dietary and lifestyle modifications  Food allergy  Continue to avoid banana. Continue to carry epinephrine auto-injector with you at all times   Please let us know if this treatment plan is not working well for you Schedule a follow-up appointment in 2 weeks

## 2020-07-29 ENCOUNTER — Ambulatory Visit (INDEPENDENT_AMBULATORY_CARE_PROVIDER_SITE_OTHER): Payer: Medicaid Other | Admitting: Family

## 2020-07-29 ENCOUNTER — Encounter: Payer: Self-pay | Admitting: Family

## 2020-07-29 ENCOUNTER — Other Ambulatory Visit: Payer: Self-pay

## 2020-07-29 ENCOUNTER — Ambulatory Visit: Payer: Medicaid Other | Admitting: Family

## 2020-07-29 VITALS — BP 116/60 | HR 104 | Temp 97.8°F | Resp 16

## 2020-07-29 DIAGNOSIS — J4530 Mild persistent asthma, uncomplicated: Secondary | ICD-10-CM | POA: Diagnosis not present

## 2020-07-29 DIAGNOSIS — K219 Gastro-esophageal reflux disease without esophagitis: Secondary | ICD-10-CM

## 2020-07-29 DIAGNOSIS — J3089 Other allergic rhinitis: Secondary | ICD-10-CM | POA: Diagnosis not present

## 2020-07-29 LAB — OB RESULTS CONSOLE RPR: RPR: NONREACTIVE

## 2020-07-29 NOTE — Progress Notes (Signed)
[address] [place] [address] Dept: [phone]  FOLLOW UP NOTE  Patient ID: Tina [name], female    DOB: [birth year]  Age: 35 y.o. MRN: [id number] Date of Office Visit: 07/29/2020  Assessment  Chief Complaint: Asthma  HPI Tina [name] is a 35 year old female who presents today for follow-up of mild persistent asthma, seasonal and perennial allergic rhinitis, gastroesophageal reflux disease, and food allergy. She was last seen by Nehemiah Settle, FNP on June 15, 2020.  She is currently pregnant and tomorrow she will be [redacted] weeks pregnant. Her due date is October 15, 2020.  Mild persistent asthma is reported as moderately controlled with Pulmicort Flexhaler 180 mcg 2 puffs twice a day every other day and albuterol as needed.  She is only using her Pulmicort about every other day, because she forgets to use it.  She reports a productive cough with clear sputum since the end of December, wheezing at times, and shortness of breath when going upstairs.  She denies any tightness in her chest.  She has not used her albuterol inhaler since we last saw her.  Since her last office visit she has not required any systemic steroids.  Also, she has not made any trips to the emergency room or urgent care due to breathing problems, but reports that she did go to the emergency room for other concerns.  She reports on December 26 she had a fever of 100.4 and her oxygen saturation dropped to 96%.  She called her OB/GYN and they instructed her to get rest and hydrate and to call them in the morning.  The next day her temperature was 97.9 and her oxygen saturation was 99%.  She began having Braxton Hicks contractions and her OB/GYN said that this was normal and could be due to her not feeling well.  She decided to go on to the emergency room that day and reports that she was there for only 2 hours and was told that everything was normal.  Seasonal and perennial allergic rhinitis is reported as moderately  controlled with possible use of Rhinocort nasal spray.  She is not sure if this is the nose spray that she has.  She also has saline nasal spray, but she has not used this.  She reports nasal congestion, occasional clear rhinorrhea with small specks of blood, and she denies postnasal drip, sinus tenderness, fever, and chills.  Gastroesophageal reflux disease is reported as controlled with dietary and lifestyle modifications.  She continues to avoid banana without any accidental ingestion or use of her epinephrine autoinjector device. She  now has an EpiPen.   Drug Allergies:  Allergies  Allergen Reactions  . Banana Other (See Comments)    Throat swelling  . Benadryl [Diphenhydramine Hcl]     Review of Systems: Review of Systems  Constitutional: Negative for chills and fever.  HENT:       Reports nasal congestion and occasional clear rhinorrhea with a small amount of blood at times. Denies post nasal drip  Eyes:       Denies itchy watery eyes  Respiratory: Positive for cough, shortness of breath and wheezing.        Reports productive cough with clear sputum, wheezing at times, and shortness of breath when going upstairs  Cardiovascular: Negative for chest pain and palpitations.  Gastrointestinal: Negative for abdominal pain and heartburn.  Genitourinary: Negative for dysuria.  Skin: Positive for itching. Negative for rash.       Reports itching on abdomen  Neurological: Negative for headaches.  Endo/Heme/Allergies: Positive for environmental allergies.    Physical Exam: BP 116/60   Pulse (!) 104   Temp 97.8 F (36.6 C) (Tympanic)   Resp 16   SpO2 100%    Physical Exam Constitutional:      Appearance: Normal appearance.  HENT:     Head: Normocephalic and atraumatic.     Comments: Pharynx normal. Eyes normal. Ears normal. Nose: left septal irritation/abrasion noted and bilateral lower turbinates moderately edematous with clear drainage noted    Right Ear: Tympanic  membrane, ear canal and external ear normal.     Left Ear: Tympanic membrane, ear canal and external ear normal.     Mouth/Throat:     Mouth: Mucous membranes are moist.     Pharynx: Oropharynx is clear.  Eyes:     Conjunctiva/sclera: Conjunctivae normal.  Cardiovascular:     Rate and Rhythm: Regular rhythm.     Heart sounds: Normal heart sounds.  Pulmonary:     Effort: Pulmonary effort is normal.     Breath sounds: Normal breath sounds.     Comments: Lungs clear to auscultation Musculoskeletal:     Cervical back: Neck supple.  Skin:    General: Skin is warm.  Neurological:     Mental Status: She is alert and oriented to person, place, and time.  Psychiatric:        Mood and Affect: Mood normal.        Behavior: Behavior normal.        Thought Content: Thought content normal.        Judgment: Judgment normal.     Diagnostics: FVC 2.39 L, FEV1 2.03 L.  Predicted FVC 2.73 L, FEV1 2.32 L.  Spirometry indicates normal ventilatory function.  Status post bronchodilator response shows FVC 2.34 L, FEV1 2.00 L.  Spirometry indicates normal ventilatory function with no significant bronchodilator response.  Assessment and Plan: 1. Not well controlled mild persistent asthma   2. Seasonal and perennial allergic rhinitis   3. Gastroesophageal reflux disease, unspecified whether esophagitis present     No orders of the defined types were placed in this encounter.   Patient Instructions  Mild persistent asthma Increase Pulmicort Flexihaler 180 mcg 2 puffs twice a day to help prevent cough and wheeze. Make sure and use this every day. Either set an alarm on your phone twice a day to remember to use Pulmicort or set your inhaler next to your toothbrush and use before you brush your teeth May use albuterol 2 puffs every 4 hours as needed for cough, wheeze, tightness in chest, or shortness of breath.  Seasonal and perennial allergic rhinitis Continue Claritin 10 mg as per OBGYN once a day  as needed for runny nose. Continue Rhinocort nasal spray 1-2 sprays each nostril once a day as needed for stuffy nose. This is safe to use while pregnant.In the right nostril, point the applicator out toward the right ear. In the left nostril, point the applicator out toward the left ear  Gastroesophageal reflux disease Continue dietary and lifestyle modifications  Food allergy  Continue to avoid banana. Continue to carry epinephrine auto-injector with you at all times   Please let us know if this treatment plan is not working well for you Schedule a follow-up appointment in 2 weeks   Return in about 2 weeks (around 08/12/2020).    Thank you for the opportunity to care for this patient.  Please do not hesitate to contact me with  questions.  Nehemiah Settle, FNP [facility]

## 2020-08-12 ENCOUNTER — Ambulatory Visit: Payer: Medicaid Other | Admitting: Family

## 2020-08-12 ENCOUNTER — Ambulatory Visit: Payer: Medicaid Other | Admitting: Family Medicine

## 2020-08-23 NOTE — Patient Instructions (Addendum)
Mild persistent asthma Continue Pulmicort Flexihaler 180 mcg 2 puffs twice a day to help prevent cough and wheeze. Make sure and use this every day. Either set an alarm on your phone twice a day to remember to use Pulmicort or set your inhaler next to your toothbrush and use before you brush your teeth May use albuterol 2 puffs every 4 hours as needed for cough, wheeze, tightness in chest, or shortness of breath.  Seasonal and perennial allergic rhinitis Continue Claritin 10 mg as per OBGYN once a day as needed for runny nose.  Continue Rhinocort nasal spray 1-2 sprays each nostril once a day as needed for stuffy nose. This is safe to use while pregnant.In the right nostril, point the applicator out toward the right ear. In the left nostril, point the applicator out toward the left ear  Gastroesophageal reflux disease Continue dietary and lifestyle modifications  Food allergy  Continue to avoid banana. Continue to carry epinephrine auto-injector with you at all times   Please let us know if this treatment plan is not working well for you Schedule a follow-up appointment in 4 weeks or sooner if needed

## 2020-08-24 ENCOUNTER — Ambulatory Visit (INDEPENDENT_AMBULATORY_CARE_PROVIDER_SITE_OTHER): Payer: Medicaid Other | Admitting: Family

## 2020-08-24 ENCOUNTER — Other Ambulatory Visit: Payer: Self-pay

## 2020-08-24 ENCOUNTER — Encounter: Payer: Self-pay | Admitting: Family

## 2020-08-24 VITALS — BP 120/66 | HR 86 | Temp 98.5°F | Resp 16

## 2020-08-24 DIAGNOSIS — J3089 Other allergic rhinitis: Secondary | ICD-10-CM

## 2020-08-24 DIAGNOSIS — T7800XD Anaphylactic reaction due to unspecified food, subsequent encounter: Secondary | ICD-10-CM | POA: Diagnosis not present

## 2020-08-24 DIAGNOSIS — J4530 Mild persistent asthma, uncomplicated: Secondary | ICD-10-CM

## 2020-08-24 DIAGNOSIS — K219 Gastro-esophageal reflux disease without esophagitis: Secondary | ICD-10-CM

## 2020-08-24 DIAGNOSIS — Z3A32 32 weeks gestation of pregnancy: Secondary | ICD-10-CM

## 2020-08-24 NOTE — Progress Notes (Signed)
[address] [place] [address] Dept: [phone]  FOLLOW UP NOTE  Patient ID: Tina [name], female    DOB: 06/05/1986  Age: 35 y.o. MRN: [id number] Date of Office Visit: 08/24/2020  Assessment  Chief Complaint: Asthma  HPI Tina [name] is a 35 year old female who presents today for follow-up on mild persistent asthma, seasonal and perennial allergic rhinitis, gastroesophageal reflux disease, and food allergy. She was last seen on July 29, 2020 by Nehemiah Settle, FNP.  She is currently [redacted] weeks pregnant.  Mild persistent asthma is reported as doing a lot better.  She continues to use Pulmicort Flexhaler 180 mcg 2 puffs twice a day and albuterol as needed.  She reports a cough in the morning to clear her throat, occasional wheezing 1-2 times a week at night, and shortness of breath when going up and down stairs or walking for a long period of time.  She denies any tightness in her chest.  Since her last office visit she has not required use of her albuterol inhaler.  Also, since her last office visit she has not required any systemic steroids or made any trips to the emergency room or urgent care due to breathing problems.  Seasonal and perennial allergic rhinitis is reported as moderately controlled with Claritin 10 mg once a day as needed and Rhinocort nasal spray as needed.  She reports nasal congestion, clear rhinorrhea in the morning, and postnasal drip only in the morning.  Gastroesophageal reflux disease is reported as controlled with no medications.  She continues to avoid banana without any accidental ingestion or use of her epinephrine autoinjector.  She reports that her epinephrine autoinjector is up-to-date.   Drug Allergies:  Allergies  Allergen Reactions  . Banana Other (See Comments)    Throat swelling  . Benadryl [Diphenhydramine Hcl]     Review of Systems: Review of Systems  Constitutional: Negative for chills and fever.  HENT: Positive for congestion.         Reports postnasal drip in the morning, clear rhinorrhea in the morning, and nasal congestion throughout the day  Eyes:       Denies itchy watery eyes  Respiratory: Positive for cough, shortness of breath and wheezing.        Reports cough due to postnasal drip, shortness of breath when walking up steps or walking a longer distance.  She also reports occasional wheezing at night.  Wheezing occurs maybe 1-2 times a week.  Cardiovascular: Negative for chest pain and palpitations.  Gastrointestinal: Negative for abdominal pain and heartburn.  Genitourinary: Negative for dysuria.  Skin: Positive for itching. Negative for rash.       She reports itching of hands and feet.  Her OB/GYN completed a cholestasis test and she reports that this is fine.  Itching is now not as severe  Neurological: Negative for headaches.  Endo/Heme/Allergies: Positive for environmental allergies.    Physical Exam: BP 120/66 (BP Location: Right Arm, Patient Position: Sitting, Cuff Size: Normal)   Pulse 86   Temp 98.5 F (36.9 C) (Temporal)   Resp 16   SpO2 99%    Physical Exam Constitutional:      Appearance: Normal appearance.  HENT:     Head: Normocephalic and atraumatic.     Comments: Pharynx normal, eyes normal, ears normal, nose bilateral lower turbinate moderately edematous with no erythema or drainage noted    Right Ear: Tympanic membrane, ear canal and external ear normal.     Left Ear: Tympanic  membrane, ear canal and external ear normal.     Mouth/Throat:     Mouth: Mucous membranes are moist.     Pharynx: Oropharynx is clear.  Eyes:     Conjunctiva/sclera: Conjunctivae normal.  Cardiovascular:     Rate and Rhythm: Regular rhythm.     Heart sounds: Normal heart sounds.  Pulmonary:     Effort: Pulmonary effort is normal.     Breath sounds: Normal breath sounds.     Comments: Lungs clear to auscultation Musculoskeletal:     Cervical back: Neck supple.  Skin:    General: Skin is warm.      Comments: No rashes or urticarial lesions noted  Neurological:     Mental Status: She is alert and oriented to person, place, and time.  Psychiatric:        Mood and Affect: Mood normal.        Behavior: Behavior normal.        Thought Content: Thought content normal.        Judgment: Judgment normal.     Diagnostics: FVC 2.33 L, FEV1 1.97 L.  Predicted FVC 2.73 L, FEV1 2.32 L.  Spirometry indicates normal ventilatory function.  Assessment and Plan: 1. Mild persistent asthma without complication   2. Seasonal and perennial allergic rhinitis   3. Anaphylactic shock due to food, subsequent encounter   4. Gastroesophageal reflux disease, unspecified whether esophagitis present   5. [redacted] weeks gestation of pregnancy     No orders of the defined types were placed in this encounter.   Patient Instructions  Mild persistent asthma Continue Pulmicort Flexihaler 180 mcg 2 puffs twice a day to help prevent cough and wheeze. Make sure and use this every day. Either set an alarm on your phone twice a day to remember to use Pulmicort or set your inhaler next to your toothbrush and use before you brush your teeth May use albuterol 2 puffs every 4 hours as needed for cough, wheeze, tightness in chest, or shortness of breath.  Seasonal and perennial allergic rhinitis Continue Claritin 10 mg as per OBGYN once a day as needed for runny nose.  Continue Rhinocort nasal spray 1-2 sprays each nostril once a day as needed for stuffy nose. This is safe to use while pregnant.In the right nostril, point the applicator out toward the right ear. In the left nostril, point the applicator out toward the left ear  Gastroesophageal reflux disease Continue dietary and lifestyle modifications  Food allergy  Continue to avoid banana. Continue to carry epinephrine auto-injector with you at all times   Please let us know if this treatment plan is not working well for you Schedule a follow-up appointment in 4  weeks or sooner if needed   Return in about 4 weeks (around 09/21/2020), or if symptoms worsen or fail to improve.    Thank you for the opportunity to care for this patient.  Please do not hesitate to contact me with questions.  Nehemiah Settle, FNP [facility]

## 2020-08-25 ENCOUNTER — Ambulatory Visit: Payer: Medicaid Other | Admitting: Family Medicine

## 2020-08-25 ENCOUNTER — Encounter: Payer: Self-pay | Admitting: Family Medicine

## 2020-08-25 DIAGNOSIS — G5600 Carpal tunnel syndrome, unspecified upper limb: Secondary | ICD-10-CM | POA: Insufficient documentation

## 2020-08-25 DIAGNOSIS — G5601 Carpal tunnel syndrome, right upper limb: Secondary | ICD-10-CM | POA: Diagnosis not present

## 2020-08-25 NOTE — Assessment & Plan Note (Signed)
Related to, and exacerbated by pregnancy.  Discussed with Dr. Benjamin Stain and I'll have her schedule with him for injection/hydrodissection around median nerve.  For now she can continue braces and strectching, icing of wrists.

## 2020-08-25 NOTE — Progress Notes (Signed)
Tina [name] - 35 y.o. female MRN [id number]  Date of birth: 12-03-1985  Subjective Chief Complaint  Patient presents with  . Arm Swelling    HPI Tina [name] is a 35 y.o. female here today with complaint of right wrist pain. Currently [redacted]  Weeks pregnant.  Started a couple of weeks ago and has progressed since onset.  Having some swelling on this side.  She has weakness in grip strength and has difficulty doing simple tasks such as wiping after using the restroom.  She has tried braces, tylenol and icing which have been minimally helpful.    ROS:  A comprehensive ROS was completed and negative except as noted per HPI  Allergies  Allergen Reactions  . Banana Other (See Comments)    Throat swelling  . Benadryl [Diphenhydramine Hcl]     Past Medical History:  Diagnosis Date  . Allergy   . Asthma   . Heart murmur     Past Surgical History:  Procedure Laterality Date  . BREAST EXCISIONAL BIOPSY Left 2007  . COLPOSCOPY    . LAPAROSCOPY    . WISDOM TOOTH EXTRACTION      Social History   Socioeconomic History  . Marital status: Single    Spouse name: Not on file  . Number of children: Not on file  . Years of education: Not on file  . Highest education level: Not on file  Occupational History  . Not on file  Tobacco Use  . Smoking status: Former Smoker    Types: E-cigarettes  . Smokeless tobacco: Never Used  Vaping Use  . Vaping Use: Never used  Substance and Sexual Activity  . Alcohol use: Yes    Comment: occ  . Drug use: No  . Sexual activity: Yes    Birth control/protection: Condom  Other Topics Concern  . Not on file  Social History Narrative  . Not on file   Social Determinants of Health   Financial Resource Strain: Not on file  Food Insecurity: Not on file  Transportation Needs: Not on file  Physical Activity: Not on file  Stress: Not on file  Social Connections: Not on file    Family History  Problem Relation Age of Onset  . Diabetes Mother   .  Hypertension Father   . Asthma Father   . Diabetes Father   . Cancer Maternal Aunt        colon  . Lupus Maternal Aunt   . Diabetes Maternal Aunt   . Breast cancer Maternal Grandmother   . Breast cancer Paternal Grandmother   . Diabetes Paternal Grandmother   . Hypertension Maternal Aunt   . Diabetes Maternal Aunt   . Diabetes Maternal Aunt   . Allergic rhinitis Sister   . Food Allergy Sister   . Eczema Sister   . Asthma Maternal Uncle   . Diabetes Paternal Grandfather     Health Maintenance  Topic Date Due  . Hepatitis C Screening  Never done  . COVID-19 Vaccine (1) Never done  . HIV Screening  Never done  . PAP SMEAR-Modifier  Never done  . INFLUENZA VACCINE  Never done  . TETANUS/TDAP  09/03/2020 (Originally 02/21/2006)     ----------------------------------------------------------------------------------------------------------------------------------------------------------------------------------------------------------------- Physical Exam BP (!) 145/72 (BP Location: Left Arm, Patient Position: Sitting, Cuff Size: Small)   Pulse 84   Temp 98 F (36.7 C)   Wt 147 lb 12.8 oz (67 kg)   SpO2 100%   BMI 28.87 kg/m   Physical Exam  Constitutional:      Appearance: Normal appearance.  Musculoskeletal:     Comments: Mild swelling of R wrist and forearm. Positive tinel over carpal tunnel.  Increased pain with phalen test.    Neurological:     Mental Status: She is alert.  Psychiatric:        Mood and Affect: Mood normal.        Behavior: Behavior normal.     ------------------------------------------------------------------------------------------------------------------------------------------------------------------------------------------------------------------- Assessment and Plan  CTS (carpal tunnel syndrome) Related to, and exacerbated by pregnancy.  Discussed with Dr. Benjamin Stain and I'll have her schedule with him for injection/hydrodissection around  median nerve.  For now she can continue braces and strectching, icing of wrists.     No orders of the defined types were placed in this encounter.   No follow-ups on file.    This visit occurred during the SARS-CoV-2 public health emergency.  Safety protocols were in place, including screening questions prior to the visit, additional usage of staff PPE, and extensive cleaning of exam room while observing appropriate contact time as indicated for disinfecting solutions.

## 2020-08-25 NOTE — Progress Notes (Signed)
Patient notes right-sided body swelling.

## 2020-08-25 NOTE — Patient Instructions (Signed)
Continue braces Continue icing.  Schedule with Dr. Benjamin Stain for injection.

## 2020-08-30 ENCOUNTER — Ambulatory Visit (INDEPENDENT_AMBULATORY_CARE_PROVIDER_SITE_OTHER): Payer: Medicaid Other

## 2020-08-30 ENCOUNTER — Other Ambulatory Visit: Payer: Self-pay

## 2020-08-30 ENCOUNTER — Ambulatory Visit: Payer: Medicaid Other | Admitting: Sports Medicine

## 2020-08-30 DIAGNOSIS — G5601 Carpal tunnel syndrome, right upper limb: Secondary | ICD-10-CM | POA: Diagnosis not present

## 2020-08-30 NOTE — Progress Notes (Signed)
    Procedures performed today:    Procedure: Real-time Ultrasound Guided hydrodissection of the right median nerve at the carpal tunnel Device: Samsung [id number] Verbal informed consent obtained.  Time-out conducted.  Noted no overlying erythema, induration, or other signs of local infection.  Skin prepped in a sterile fashion.  Local anesthesia: Topical Ethyl chloride.  With sterile technique and under real time ultrasound guidance: Median nerve appeared enlarged.  Using a 25-gauge needle advanced into the carpal tunnel, taking care to avoid intraneural injection I injected medication both superficial to and deep to the median nerve freeing it from surrounding structures, I then redirected the needle deep and injected further medication around the flexor tendons deep within the carpal tunnel for a total of 1 cc kenalog 40, 5 cc 1% lidocaine without epinephrine. Completed without difficulty  Advised to call if fevers/chills, erythema, induration, drainage, or persistent bleeding.  Images permanently stored and available for review in PACS.  Impression: Technically successful ultrasound guided median nerve hydrodissection.  Independent interpretation of notes and tests performed by another provider:   None.  Brief History, Exam, Impression, and Recommendations:    CTS (carpal tunnel syndrome) This is a very pleasant 35 year old female, approximately [redacted] weeks pregnant, she has had numbness and tingling, predominantly nocturnal and in the morning, both hands, right worse than left. Positive Tinel's sign today. Has failed nighttime splinting. We did a right-sided median nerve hydrodissection today, return in a month, we can do the left side if she desires.   ___________________________________________ Ihor Austin. Benjamin Stain, M.D., ABFM., CAQSM. Primary Care and Sports Medicine [facility]  Adjunct Instructor of Family Medicine  [employer]

## 2020-08-30 NOTE — Assessment & Plan Note (Signed)
This is a very pleasant 35 year old female, approximately [redacted] weeks pregnant, she has had numbness and tingling, predominantly nocturnal and in the morning, both hands, right worse than left. Positive Tinel's sign today. Has failed nighttime splinting. We did a right-sided median nerve hydrodissection today, return in a month, we can do the left side if she desires.

## 2020-09-02 ENCOUNTER — Non-Acute Institutional Stay (HOSPITAL_COMMUNITY)
Admission: RE | Admit: 2020-09-02 | Discharge: 2020-09-02 | Disposition: A | Discharge status: Home / Self Care | Payer: Medicaid Other | Source: Ambulatory Visit | Attending: Internal Medicine | Admitting: Internal Medicine

## 2020-09-02 ENCOUNTER — Other Ambulatory Visit: Payer: Self-pay

## 2020-09-02 DIAGNOSIS — D649 Anemia, unspecified: Secondary | ICD-10-CM | POA: Diagnosis present

## 2020-09-02 MED ORDER — SODIUM CHLORIDE 0.9 % IV SOLN
510.0000 mg | Freq: Once | INTRAVENOUS | Status: AC
  Administered 2020-09-02: 510 mg via INTRAVENOUS
  Filled 2020-09-02: qty 510

## 2020-09-02 MED ORDER — SODIUM CHLORIDE 0.9 % IV SOLN
INTRAVENOUS | Status: DC | PRN
  Administered 2020-09-02: 250 mL via INTRAVENOUS

## 2020-09-02 NOTE — Discharge Instructions (Signed)
Ferumoxytol injection What is this medicine? FERUMOXYTOL is an iron complex. Iron is used to make healthy red blood cells, which carry oxygen and nutrients throughout the body. This medicine is used to treat iron deficiency anemia. This medicine may be used for other purposes; ask your health care provider or pharmacist if you have questions. COMMON BRAND NAME(S): Feraheme What should I tell my health care provider before I take this medicine? They need to know if you have any of these conditions:  anemia not caused by low iron levels  high levels of iron in the blood  magnetic resonance imaging (MRI) test scheduled  an unusual or allergic reaction to iron, other medicines, foods, dyes, or preservatives  pregnant or trying to get pregnant  breast-feeding How should I use this medicine? This medicine is for injection into a vein. It is given by a health care professional in a hospital or clinic setting. Talk to your pediatrician regarding the use of this medicine in children. Special care may be needed. Overdosage: If you think you have taken too much of this medicine contact a poison control center or emergency room at once. NOTE: This medicine is only for you. Do not share this medicine with others. What if I miss a dose? It is important not to miss your dose. Call your doctor or health care professional if you are unable to keep an appointment. What may interact with this medicine? This medicine may interact with the following medications:  other iron products This list may not describe all possible interactions. Give your health care provider a list of all the medicines, herbs, non-prescription drugs, or dietary supplements you use. Also tell them if you smoke, drink alcohol, or use illegal drugs. Some items may interact with your medicine. What should I watch for while using this medicine? Visit your doctor or healthcare professional regularly. Tell your doctor or healthcare  professional if your symptoms do not start to get better or if they get worse. You may need blood work done while you are taking this medicine. You may need to follow a special diet. Talk to your doctor. Foods that contain iron include: whole grains/cereals, dried fruits, beans, or peas, leafy green vegetables, and organ meats (liver, kidney). What side effects may I notice from receiving this medicine? Side effects that you should report to your doctor or health care professional as soon as possible:  allergic reactions like skin rash, itching or hives, swelling of the face, lips, or tongue  breathing problems  changes in blood pressure  feeling faint or lightheaded, falls  fever or chills  flushing, sweating, or hot feelings  swelling of the ankles or feet Side effects that usually do not require medical attention (report to your doctor or health care professional if they continue or are bothersome):  diarrhea  headache  nausea, vomiting  stomach pain This list may not describe all possible side effects. Call your doctor for medical advice about side effects. You may report side effects to FDA at 1-800-FDA-1088. Where should I keep my medicine? This drug is given in a hospital or clinic and will not be stored at home. NOTE: This sheet is a summary. It may not cover all possible information. If you have questions about this medicine, talk to your doctor, pharmacist, or health care provider.  2021 Elsevier/Gold Standard (2016-08-14 20:21:10)  

## 2020-09-02 NOTE — Progress Notes (Signed)
Patient received IV Feraheme as ordered by Huel Cote MD. Observed for at least 30 minutes post infusion.Tolerated well, vitals stable, discharge instructions given, verbalized understanding. Patient alert, oriented and ambulatory at the time of discharge.

## 2020-09-09 ENCOUNTER — Non-Acute Institutional Stay (HOSPITAL_COMMUNITY)
Admission: RE | Admit: 2020-09-09 | Discharge: 2020-09-09 | Disposition: A | Discharge status: Home / Self Care | Payer: Medicaid Other | Source: Ambulatory Visit | Attending: Internal Medicine | Admitting: Internal Medicine

## 2020-09-09 ENCOUNTER — Other Ambulatory Visit: Payer: Self-pay

## 2020-09-09 DIAGNOSIS — D649 Anemia, unspecified: Secondary | ICD-10-CM | POA: Insufficient documentation

## 2020-09-09 MED ORDER — SODIUM CHLORIDE 0.9 % IV SOLN
INTRAVENOUS | Status: DC | PRN
  Administered 2020-09-09: 250 mL via INTRAVENOUS

## 2020-09-09 MED ORDER — SODIUM CHLORIDE 0.9 % IV SOLN
510.0000 mg | Freq: Once | INTRAVENOUS | Status: AC
  Administered 2020-09-09: 510 mg via INTRAVENOUS
  Filled 2020-09-09: qty 510

## 2020-09-09 NOTE — Discharge Instructions (Signed)
Ferumoxytol injection What is this medicine? FERUMOXYTOL is an iron complex. Iron is used to make healthy red blood cells, which carry oxygen and nutrients throughout the body. This medicine is used to treat iron deficiency anemia. This medicine may be used for other purposes; ask your health care provider or pharmacist if you have questions. COMMON BRAND NAME(S): Feraheme What should I tell my health care provider before I take this medicine? They need to know if you have any of these conditions:  anemia not caused by low iron levels  high levels of iron in the blood  magnetic resonance imaging (MRI) test scheduled  an unusual or allergic reaction to iron, other medicines, foods, dyes, or preservatives  pregnant or trying to get pregnant  breast-feeding How should I use this medicine? This medicine is for injection into a vein. It is given by a health care professional in a hospital or clinic setting. Talk to your pediatrician regarding the use of this medicine in children. Special care may be needed. Overdosage: If you think you have taken too much of this medicine contact a poison control center or emergency room at once. NOTE: This medicine is only for you. Do not share this medicine with others. What if I miss a dose? It is important not to miss your dose. Call your doctor or health care professional if you are unable to keep an appointment. What may interact with this medicine? This medicine may interact with the following medications:  other iron products This list may not describe all possible interactions. Give your health care provider a list of all the medicines, herbs, non-prescription drugs, or dietary supplements you use. Also tell them if you smoke, drink alcohol, or use illegal drugs. Some items may interact with your medicine. What should I watch for while using this medicine? Visit your doctor or healthcare professional regularly. Tell your doctor or healthcare  professional if your symptoms do not start to get better or if they get worse. You may need blood work done while you are taking this medicine. You may need to follow a special diet. Talk to your doctor. Foods that contain iron include: whole grains/cereals, dried fruits, beans, or peas, leafy green vegetables, and organ meats (liver, kidney). What side effects may I notice from receiving this medicine? Side effects that you should report to your doctor or health care professional as soon as possible:  allergic reactions like skin rash, itching or hives, swelling of the face, lips, or tongue  breathing problems  changes in blood pressure  feeling faint or lightheaded, falls  fever or chills  flushing, sweating, or hot feelings  swelling of the ankles or feet Side effects that usually do not require medical attention (report to your doctor or health care professional if they continue or are bothersome):  diarrhea  headache  nausea, vomiting  stomach pain This list may not describe all possible side effects. Call your doctor for medical advice about side effects. You may report side effects to FDA at 1-800-FDA-1088. Where should I keep my medicine? This drug is given in a hospital or clinic and will not be stored at home. NOTE: This sheet is a summary. It may not cover all possible information. If you have questions about this medicine, talk to your doctor, pharmacist, or health care provider.  2021 Elsevier/Gold Standard (2016-08-14 20:21:10)  

## 2020-09-09 NOTE — Progress Notes (Signed)
PATIENT CARE CENTER NOTE  Diagnosis: Anemia D64.9   Provider: Huel Cote, MD   Procedure: Feraheme infusion    Note: Patient received second Feraheme infusion via PIV. Tolerated well with no adverse reaction. Observed patient for 30 minutes post-infusion. Vital signs stable. AVS offered but patient refused. Patient alert, oriented and ambulatory at discharge.

## 2020-09-20 NOTE — Patient Instructions (Addendum)
Mild persistent asthma Continue Pulmicort Flexihaler 180 mcg 2 puffs twice a day to help prevent cough and wheeze. Make sure and use this every day. Either set an alarm on your phone twice a day to remember to use Pulmicort or set your inhaler next to your toothbrush and use before you brush your teeth May use albuterol 2 puffs every 4 hours as needed for cough, wheeze, tightness in chest, or shortness of breath.  Seasonal and perennial allergic rhinitis Continue Claritin 10 mg as per OBGYN once a day as needed for runny nose.  Stop Rhinocort nasal spray for the next week to help with nasal irritation, then start back 1-2 sprays each nostril once a day as needed for stuffy nose. This is safe to use while pregnant.In the right nostril, point the applicator out toward the right ear. In the left nostril, point the applicator out toward the left ear  Gastroesophageal reflux disease Continue dietary and lifestyle modifications  Food allergy  Continue to avoid banana. Continue to carry epinephrine auto-injector with you at all times  She does not wish to have her AVS printed today.  Please let us know if this treatment plan is not working well for you Schedule a follow-up appointment in 6  weeks or sooner if needed

## 2020-09-21 ENCOUNTER — Ambulatory Visit (INDEPENDENT_AMBULATORY_CARE_PROVIDER_SITE_OTHER): Payer: Medicaid Other | Admitting: Family

## 2020-09-21 ENCOUNTER — Other Ambulatory Visit: Payer: Self-pay

## 2020-09-21 ENCOUNTER — Encounter: Payer: Self-pay | Admitting: Family

## 2020-09-21 VITALS — BP 122/68 | HR 94 | Temp 98.4°F | Resp 16

## 2020-09-21 DIAGNOSIS — T7800XD Anaphylactic reaction due to unspecified food, subsequent encounter: Secondary | ICD-10-CM

## 2020-09-21 DIAGNOSIS — J4530 Mild persistent asthma, uncomplicated: Secondary | ICD-10-CM | POA: Diagnosis not present

## 2020-09-21 DIAGNOSIS — J3089 Other allergic rhinitis: Secondary | ICD-10-CM

## 2020-09-21 DIAGNOSIS — K219 Gastro-esophageal reflux disease without esophagitis: Secondary | ICD-10-CM

## 2020-09-21 DIAGNOSIS — Z3A36 36 weeks gestation of pregnancy: Secondary | ICD-10-CM

## 2020-09-21 NOTE — Progress Notes (Signed)
[address] [place] [address] Dept: [phone]  FOLLOW UP NOTE  Patient ID: Tina [name], female    DOB: 06/12/1986  Age: 35 y.o. MRN: [id number] Date of Office Visit: 09/21/2020  Assessment  Chief Complaint: Asthma  HPI Tina [name] is a 35 year old female who presents today for follow-up of mild persistent asthma, seasonal and perennial allergic rhinitis, anaphylactic shock due to food, gastroesophageal reflux disease, and [redacted] weeks gestation of pregnancy.  She was last seen on August 24, 2020 by Nehemiah Settle, FNP.  She reports that she is now [redacted] weeks pregnant and will be 37 weeks this Friday.  Mild persistent asthma is reported as doing a lot better with Pulmicort Flexhaler 180 mcg 2 puffs twice a day with spacer and albuterol as needed.  She denies any coughing, wheezing, tightness in her chest, shortness of breath, and nocturnal awakenings.  She has not had to use her albuterol inhaler in the past couple weeks.  Since her last office visit she has not required any systemic steroids or made any trips to the emergency room or urgent care due to breathing problems.  Seasonal and perennial allergic rhinitis is reported as moderately controlled with Claritin 10 mg once a day as needed, and Rhinocort nasal spray as needed.  She reports that her nasal congestion has gotten better, but will still have some in the morning before she blows her nose.  She is having clear rhinorrhea with specks of blood at times.  She notices that this is coming from her left nostril.  She denies any sinus tenderness, fever, or chills.  She denies any nasal dryness.  Reflux is reported as controlled with no medications at this time.  She continues to avoid bananas without any accidental ingestion or use of her EpiPen.   Drug Allergies:  Allergies  Allergen Reactions  . Banana Other (See Comments)    Throat swelling  . Latex   . Benadryl [Diphenhydramine Hcl]     Review of Systems: Review  of Systems  Constitutional: Negative for chills and fever.  HENT:       Reports nasal congestion in the morning and clear rhinorrhea the small specks of blood.  Denies postnasal drip and sinus tenderness  Eyes:       Denies itchy watery eyes  Respiratory: Negative for cough, shortness of breath and wheezing.   Cardiovascular: Negative for chest pain and palpitations.  Gastrointestinal: Negative for heartburn.  Genitourinary: Negative for dysuria.  Skin: Negative for itching and rash.  Neurological: Negative for headaches.  Endo/Heme/Allergies: Positive for environmental allergies.    Physical Exam: BP 122/68   Pulse 94   Temp 98.4 F (36.9 C) (Oral)   Resp 16   SpO2 100%    Physical Exam Constitutional:      Appearance: Normal appearance.  HENT:     Head: Normocephalic and atraumatic.     Comments: Pharynx normal, eyes normal, ears normal, nose bilateral lower turbinate mildly edematous and slightly erythematous.  Irritation noted in left nostril    Right Ear: Tympanic membrane, ear canal and external ear normal.     Left Ear: Tympanic membrane, ear canal and external ear normal.     Mouth/Throat:     Mouth: Mucous membranes are moist.     Pharynx: Oropharynx is clear.  Eyes:     Conjunctiva/sclera: Conjunctivae normal.  Cardiovascular:     Rate and Rhythm: Regular rhythm.  Pulmonary:     Effort: Pulmonary effort is normal.  Breath sounds: Normal breath sounds.     Comments: Lungs clear to auscultation Musculoskeletal:     Cervical back: Neck supple.  Skin:    General: Skin is warm.  Neurological:     Mental Status: She is alert and oriented to person, place, and time.  Psychiatric:        Mood and Affect: Mood normal.        Behavior: Behavior normal.        Thought Content: Thought content normal.        Judgment: Judgment normal.     Diagnostics: FVC 2.38 L, FEV1 2.01 L.  Predicted FVC 2.73 L, FEV1 2.32 L spirometry indicates normal ventilatory  function.  Assessment and Plan: 1. Mild persistent asthma without complication   2. Seasonal and perennial allergic rhinitis   3. Anaphylactic shock due to food, subsequent encounter   4. Gastroesophageal reflux disease, unspecified whether esophagitis present   5. [redacted] weeks gestation of pregnancy     No orders of the defined types were placed in this encounter.   Patient Instructions  Mild persistent asthma Continue Pulmicort Flexihaler 180 mcg 2 puffs twice a day to help prevent cough and wheeze. Make sure and use this every day. Either set an alarm on your phone twice a day to remember to use Pulmicort or set your inhaler next to your toothbrush and use before you brush your teeth May use albuterol 2 puffs every 4 hours as needed for cough, wheeze, tightness in chest, or shortness of breath.  Seasonal and perennial allergic rhinitis Continue Claritin 10 mg as per OBGYN once a day as needed for runny nose.  Stop Rhinocort nasal spray for the next week to help with nasal irritation, then start back 1-2 sprays each nostril once a day as needed for stuffy nose. This is safe to use while pregnant.In the right nostril, point the applicator out toward the right ear. In the left nostril, point the applicator out toward the left ear  Gastroesophageal reflux disease Continue dietary and lifestyle modifications  Food allergy  Continue to avoid banana. Continue to carry epinephrine auto-injector with you at all times  She does not wish to have her AVS printed today.  Please let us know if this treatment plan is not working well for you Schedule a follow-up appointment in 6  weeks or sooner if needed   Return in about 6 weeks (around 11/02/2020), or if symptoms worsen or fail to improve.    Thank you for the opportunity to care for this patient.  Please do not hesitate to contact me with questions.  Nehemiah Settle, FNP [facility]

## 2020-09-22 ENCOUNTER — Other Ambulatory Visit: Payer: Self-pay

## 2020-09-22 ENCOUNTER — Encounter (HOSPITAL_COMMUNITY): Payer: Self-pay | Admitting: Obstetrics and Gynecology

## 2020-09-22 ENCOUNTER — Inpatient Hospital Stay (HOSPITAL_COMMUNITY)
Admission: AD | Admit: 2020-09-22 | Discharge: 2020-09-22 | Disposition: A | Discharge status: Home / Self Care | Payer: Medicaid Other | Attending: Obstetrics and Gynecology | Admitting: Obstetrics and Gynecology

## 2020-09-22 DIAGNOSIS — O4703 False labor before 37 completed weeks of gestation, third trimester: Secondary | ICD-10-CM

## 2020-09-22 DIAGNOSIS — Z3A36 36 weeks gestation of pregnancy: Secondary | ICD-10-CM | POA: Diagnosis not present

## 2020-09-22 DIAGNOSIS — O479 False labor, unspecified: Secondary | ICD-10-CM

## 2020-09-22 NOTE — MAU Note (Signed)
Patient requesting not to have her cervix checked unless she begins to have CTX. Thus far, the patient reports she has not had any CTX or upper abdominal tightness since her arrival to MAU.

## 2020-09-22 NOTE — MAU Provider Note (Signed)
None  S: Ms. Tina [name] is a 35 y.o. G1P0000 at [redacted]w[redacted]d  who presents to MAU today complaining contractions irregularly.  She denies vaginal bleeding. She denies LOF. She reports normal fetal movement.    O: BP 120/69   Pulse 66   Temp 98 F (36.7 C) (Oral)   Resp 16   Ht 4\' 11"  (1.499 m)   Wt 65.9 kg   SpO2 100%   BMI 29.33 kg/m  GENERAL: Well-developed, well-nourished female in no acute distress.  HEAD: Normocephalic, atraumatic.  CHEST: Normal effort of breathing, regular heart rate ABDOMEN: Soft, nontender, gravid  Cervical exam:  Patient declined.     Fetal Monitoring: Baseline: 135 bpm Variability: Moderate  Accelerations: 15x15 Decelerations: None  Contractions: UI   A: SIUP at [redacted]w[redacted]d  False labor  Patient wanted to see contractions on the monitor to see if they were "real" She refused cervical exam  Intake BP elevated, subsequent BP's normal   P: DC home Labor precautions   [redacted]w[redacted]d, NP 09/22/2020 4:29 PM

## 2020-09-22 NOTE — MAU Note (Signed)
Tina [name] is a 34 y.o. at [redacted]w[redacted]d here in MAU reporting: today has started having upper abdominal tightening, pain is intermittent and comes about 2-3 times per hour. Also had 2 episodes of vomiting after trying to eat.   Onset of complaint: today  Pain score: 6/10  Vitals:   09/22/20 1523  BP: 140/77  Pulse: 81  Resp: 16  Temp: 98 F (36.7 C)  SpO2: 100%     FHT: +FM  Lab orders placed from triage: none

## 2020-09-23 LAB — OB RESULTS CONSOLE GBS: GBS: NEGATIVE

## 2020-09-27 ENCOUNTER — Ambulatory Visit: Payer: Medicaid Other | Admitting: Sports Medicine

## 2020-10-04 ENCOUNTER — Telehealth: Payer: Self-pay

## 2020-10-20 ENCOUNTER — Inpatient Hospital Stay (HOSPITAL_COMMUNITY): Payer: Medicaid Other | Admitting: Anesthesiology

## 2020-10-20 ENCOUNTER — Encounter (HOSPITAL_COMMUNITY)
Admission: AD | Disposition: A | Discharge status: Home / Self Care | Payer: Self-pay | Source: Home / Self Care | Attending: Obstetrics and Gynecology

## 2020-10-20 ENCOUNTER — Encounter (HOSPITAL_COMMUNITY): Payer: Self-pay | Admitting: Obstetrics and Gynecology

## 2020-10-20 ENCOUNTER — Inpatient Hospital Stay (HOSPITAL_COMMUNITY): Payer: Medicaid Other

## 2020-10-20 ENCOUNTER — Other Ambulatory Visit: Payer: Self-pay

## 2020-10-20 ENCOUNTER — Inpatient Hospital Stay (HOSPITAL_COMMUNITY)
Admission: AD | Admit: 2020-10-20 | Discharge: 2020-10-23 | DRG: 788 | Disposition: A | Discharge status: Home / Self Care | Payer: Medicaid Other | Attending: Obstetrics and Gynecology | Admitting: Obstetrics and Gynecology

## 2020-10-20 DIAGNOSIS — O9902 Anemia complicating childbirth: Secondary | ICD-10-CM | POA: Diagnosis present

## 2020-10-20 DIAGNOSIS — O1414 Severe pre-eclampsia complicating childbirth: Principal | ICD-10-CM | POA: Diagnosis present

## 2020-10-20 DIAGNOSIS — Z3A40 40 weeks gestation of pregnancy: Secondary | ICD-10-CM | POA: Diagnosis not present

## 2020-10-20 DIAGNOSIS — Z3490 Encounter for supervision of normal pregnancy, unspecified, unspecified trimester: Secondary | ICD-10-CM

## 2020-10-20 DIAGNOSIS — O26893 Other specified pregnancy related conditions, third trimester: Secondary | ICD-10-CM | POA: Diagnosis present

## 2020-10-20 DIAGNOSIS — Z20822 Contact with and (suspected) exposure to covid-19: Secondary | ICD-10-CM | POA: Diagnosis present

## 2020-10-20 DIAGNOSIS — Z87891 Personal history of nicotine dependence: Secondary | ICD-10-CM | POA: Diagnosis not present

## 2020-10-20 LAB — COMPREHENSIVE METABOLIC PANEL
ALT: 22 U/L (ref 0–44)
AST: 25 U/L (ref 15–41)
Albumin: 3 g/dL — ABNORMAL LOW (ref 3.5–5.0)
Alkaline Phosphatase: 151 U/L — ABNORMAL HIGH (ref 38–126)
Anion gap: 7 (ref 5–15)
BUN: 7 mg/dL (ref 6–20)
CO2: 20 mmol/L — ABNORMAL LOW (ref 22–32)
Calcium: 9.2 mg/dL (ref 8.9–10.3)
Chloride: 107 mmol/L (ref 98–111)
Creatinine, Ser: 0.55 mg/dL (ref 0.44–1.00)
GFR, Estimated: >60 mL/min (ref >60)
Glucose, Bld: 85 mg/dL (ref 70–99)
Potassium: 4.1 mmol/L (ref 3.5–5.1)
Sodium: 134 mmol/L — ABNORMAL LOW (ref 135–145)
Total Bilirubin: 0.5 mg/dL (ref 0.3–1.2)
Total Protein: 5.7 g/dL — ABNORMAL LOW (ref 6.5–8.1)

## 2020-10-20 LAB — CBC
HCT: 33.5 % — ABNORMAL LOW (ref 36.0–46.0)
HCT: 36.7 % (ref 36.0–46.0)
Hemoglobin: 11.1 g/dL — ABNORMAL LOW (ref 12.0–15.0)
Hemoglobin: 12 g/dL (ref 12.0–15.0)
MCH: 31.1 pg (ref 26.0–34.0)
MCH: 31.2 pg (ref 26.0–34.0)
MCHC: 32.7 g/dL (ref 30.0–36.0)
MCHC: 33.1 g/dL (ref 30.0–36.0)
MCV: 94.1 fL (ref 80.0–100.0)
MCV: 95.1 fL (ref 80.0–100.0)
Platelets: 156 10*3/uL (ref 150–400)
Platelets: 176 10*3/uL (ref 150–400)
RBC: 3.56 MIL/uL — ABNORMAL LOW (ref 3.87–5.11)
RBC: 3.86 MIL/uL — ABNORMAL LOW (ref 3.87–5.11)
RDW: 17.5 % — ABNORMAL HIGH (ref 11.5–15.5)
RDW: 17.9 % — ABNORMAL HIGH (ref 11.5–15.5)
WBC: 12 10*3/uL — ABNORMAL HIGH (ref 4.0–10.5)
WBC: 16 10*3/uL — ABNORMAL HIGH (ref 4.0–10.5)
nRBC: 0 % (ref 0.0–0.2)
nRBC: 0 % (ref 0.0–0.2)

## 2020-10-20 LAB — PROTEIN / CREATININE RATIO, URINE
Creatinine, Urine: 17.17 mg/dL
Protein Creatinine Ratio: 1.81 mg/mg{Cre} — ABNORMAL HIGH (ref 0.00–0.15)
Total Protein, Urine: 31 mg/dL

## 2020-10-20 LAB — TYPE AND SCREEN
ABO/RH(D): B POS
Antibody Screen: NEGATIVE

## 2020-10-20 LAB — RESP PANEL BY RT-PCR (FLU A&B, COVID) ARPGX2
Influenza A by PCR: NEGATIVE
Influenza B by PCR: NEGATIVE
SARS Coronavirus 2 by RT PCR: NEGATIVE

## 2020-10-20 LAB — RPR: RPR Ser Ql: NONREACTIVE

## 2020-10-20 SURGERY — Surgical Case
Anesthesia: Regional | Wound class: Clean Contaminated

## 2020-10-20 MED ORDER — METHYLPREDNISOLONE SODIUM SUCC 125 MG IJ SOLR
INTRAMUSCULAR | Status: AC
  Filled 2020-10-20: qty 2

## 2020-10-20 MED ORDER — COCONUT OIL OIL: 1.0000 "application " | TOPICAL_OIL | Status: DC | PRN

## 2020-10-20 MED ORDER — METHYLERGONOVINE MALEATE 0.2 MG/ML IJ SOLN: 0.2000 mg | INTRAMUSCULAR | Status: DC | PRN

## 2020-10-20 MED ORDER — LABETALOL HCL 5 MG/ML IV SOLN: 80.0000 mg | INTRAVENOUS | Status: DC | PRN

## 2020-10-20 MED ORDER — ACETAMINOPHEN 500 MG PO TABS
1000.0000 mg | ORAL_TABLET | Freq: Four times a day (QID) | ORAL | Status: DC
  Administered 2020-10-20 – 2020-10-23 (×11): 1000 mg via ORAL
  Filled 2020-10-20 (×13): qty 2

## 2020-10-20 MED ORDER — PHENYLEPHRINE HCL-NACL 20-0.9 MG/250ML-% IV SOLN
INTRAVENOUS | Status: AC
  Filled 2020-10-20: qty 250

## 2020-10-20 MED ORDER — FENTANYL CITRATE (PF) 100 MCG/2ML IJ SOLN
INTRAMUSCULAR | Status: DC | PRN
  Administered 2020-10-20: 15 ug via INTRATHECAL

## 2020-10-20 MED ORDER — SCOPOLAMINE 1 MG/3DAYS TD PT72
1.0000 | MEDICATED_PATCH | Freq: Once | TRANSDERMAL | Status: AC
  Administered 2020-10-20: 1.5 mg via TRANSDERMAL

## 2020-10-20 MED ORDER — MAGNESIUM SULFATE 40 GM/1000ML IV SOLN
2.0000 g/h | INTRAVENOUS | Status: AC
  Administered 2020-10-20 (×2): 2 g/h via INTRAVENOUS
  Filled 2020-10-20 (×2): qty 1000

## 2020-10-20 MED ORDER — IBUPROFEN 800 MG PO TABS
800.0000 mg | ORAL_TABLET | Freq: Three times a day (TID) | ORAL | Status: DC
  Administered 2020-10-21 – 2020-10-23 (×7): 800 mg via ORAL
  Filled 2020-10-20 (×7): qty 1

## 2020-10-20 MED ORDER — METOPROLOL TARTRATE 5 MG/5ML IV SOLN
INTRAVENOUS | Status: AC
  Filled 2020-10-20: qty 10

## 2020-10-20 MED ORDER — FAMOTIDINE IN NACL 20-0.9 MG/50ML-% IV SOLN: 20.0000 mg | Freq: Once | INTRAVENOUS | Status: DC

## 2020-10-20 MED ORDER — ONDANSETRON HCL 4 MG/2ML IJ SOLN
INTRAMUSCULAR | Status: AC
  Filled 2020-10-20: qty 2

## 2020-10-20 MED ORDER — KETOROLAC TROMETHAMINE 30 MG/ML IJ SOLN: 30.0000 mg | Freq: Four times a day (QID) | INTRAMUSCULAR | Status: AC | PRN

## 2020-10-20 MED ORDER — HYDRALAZINE HCL 20 MG/ML IJ SOLN: 10.0000 mg | INTRAMUSCULAR | Status: DC | PRN

## 2020-10-20 MED ORDER — LABETALOL HCL 5 MG/ML IV SOLN
80.0000 mg | INTRAVENOUS | Status: DC | PRN
  Administered 2020-10-20: 80 mg via INTRAVENOUS
  Filled 2020-10-20: qty 16

## 2020-10-20 MED ORDER — HYDROCORTISONE NA SUCCINATE PF 100 MG IJ SOLR
100.0000 mg | Freq: Three times a day (TID) | INTRAMUSCULAR | Status: DC
  Filled 2020-10-20 (×2): qty 2

## 2020-10-20 MED ORDER — OXYTOCIN BOLUS FROM INFUSION: 333.0000 mL | Freq: Once | INTRAVENOUS | Status: DC

## 2020-10-20 MED ORDER — EPHEDRINE SULFATE 50 MG/ML IJ SOLN
INTRAMUSCULAR | Status: DC | PRN
  Administered 2020-10-20 (×3): 10 mg via INTRAVENOUS
  Administered 2020-10-20: 20 mg via INTRAVENOUS
  Administered 2020-10-20: 10 mg via INTRAVENOUS

## 2020-10-20 MED ORDER — ONDANSETRON HCL 4 MG/2ML IJ SOLN
4.0000 mg | Freq: Four times a day (QID) | INTRAMUSCULAR | Status: DC | PRN
  Administered 2020-10-20: 4 mg via INTRAVENOUS

## 2020-10-20 MED ORDER — DEXAMETHASONE SODIUM PHOSPHATE 10 MG/ML IJ SOLN
INTRAMUSCULAR | Status: DC | PRN
  Administered 2020-10-20: 10 mg via INTRAVENOUS

## 2020-10-20 MED ORDER — PROMETHAZINE HCL 25 MG/ML IJ SOLN: 6.2500 mg | INTRAMUSCULAR | Status: DC | PRN

## 2020-10-20 MED ORDER — WITCH HAZEL-GLYCERIN EX PADS: 1.0000 "application " | MEDICATED_PAD | CUTANEOUS | Status: DC | PRN

## 2020-10-20 MED ORDER — LACTATED RINGERS IV SOLN: INTRAVENOUS | Status: DC

## 2020-10-20 MED ORDER — LABETALOL HCL 5 MG/ML IV SOLN: 20.0000 mg | INTRAVENOUS | Status: DC | PRN

## 2020-10-20 MED ORDER — NALBUPHINE HCL 10 MG/ML IJ SOLN
5.0000 mg | Freq: Once | INTRAMUSCULAR | Status: DC | PRN
Start: 2020-10-20 — End: 2020-10-23

## 2020-10-20 MED ORDER — MEASLES, MUMPS & RUBELLA VAC IJ SOLR: 0.5000 mL | Freq: Once | INTRAMUSCULAR | Status: DC

## 2020-10-20 MED ORDER — PRENATAL MULTIVITAMIN CH
1.0000 | ORAL_TABLET | Freq: Every day | ORAL | Status: DC
  Administered 2020-10-20 – 2020-10-22 (×3): 1 via ORAL
  Filled 2020-10-20 (×3): qty 1

## 2020-10-20 MED ORDER — LABETALOL HCL 5 MG/ML IV SOLN
40.0000 mg | INTRAVENOUS | Status: DC | PRN
Start: 2020-10-20 — End: 2020-10-23

## 2020-10-20 MED ORDER — OXYCODONE-ACETAMINOPHEN 5-325 MG PO TABS
1.0000 | ORAL_TABLET | ORAL | Status: DC | PRN
Start: 2020-10-20 — End: 2020-10-20

## 2020-10-20 MED ORDER — OXYCODONE HCL 5 MG PO TABS: 5.0000 mg | ORAL_TABLET | Freq: Once | ORAL | Status: DC | PRN

## 2020-10-20 MED ORDER — FENTANYL CITRATE (PF) 100 MCG/2ML IJ SOLN
INTRAMUSCULAR | Status: AC
  Filled 2020-10-20: qty 2

## 2020-10-20 MED ORDER — METHYLERGONOVINE MALEATE 0.2 MG PO TABS: 0.2000 mg | ORAL_TABLET | ORAL | Status: DC | PRN

## 2020-10-20 MED ORDER — OXYTOCIN-SODIUM CHLORIDE 30-0.9 UT/500ML-% IV SOLN
INTRAVENOUS | Status: AC
  Filled 2020-10-20: qty 500

## 2020-10-20 MED ORDER — DIPHENHYDRAMINE HCL 25 MG PO CAPS: 25.0000 mg | ORAL_CAPSULE | ORAL | Status: DC | PRN

## 2020-10-20 MED ORDER — ACETAMINOPHEN 325 MG PO TABS: 650.0000 mg | ORAL_TABLET | ORAL | Status: DC | PRN

## 2020-10-20 MED ORDER — DIPHENHYDRAMINE HCL 50 MG/ML IJ SOLN
INTRAMUSCULAR | Status: AC
  Filled 2020-10-20: qty 1

## 2020-10-20 MED ORDER — BUDESONIDE 0.25 MG/2ML IN SUSP
0.2500 mg | Freq: Two times a day (BID) | RESPIRATORY_TRACT | Status: DC
  Administered 2020-10-21 – 2020-10-23 (×4): 0.25 mg via RESPIRATORY_TRACT
  Filled 2020-10-20 (×10): qty 2

## 2020-10-20 MED ORDER — KETOROLAC TROMETHAMINE 30 MG/ML IJ SOLN
INTRAMUSCULAR | Status: AC
  Filled 2020-10-20: qty 1

## 2020-10-20 MED ORDER — NALBUPHINE HCL 10 MG/ML IJ SOLN: 5.0000 mg | INTRAMUSCULAR | Status: DC | PRN

## 2020-10-20 MED ORDER — MAGNESIUM HYDROXIDE 400 MG/5ML PO SUSP: 30.0000 mL | ORAL | Status: DC | PRN

## 2020-10-20 MED ORDER — MISOPROSTOL 25 MCG QUARTER TABLET
25.0000 ug | ORAL_TABLET | ORAL | Status: DC | PRN
  Administered 2020-10-20: 25 ug via VAGINAL
  Filled 2020-10-20: qty 1

## 2020-10-20 MED ORDER — MORPHINE SULFATE (PF) 0.5 MG/ML IJ SOLN
INTRAMUSCULAR | Status: DC | PRN
  Administered 2020-10-20: .15 mg via INTRATHECAL

## 2020-10-20 MED ORDER — LABETALOL HCL 5 MG/ML IV SOLN
40.0000 mg | INTRAVENOUS | Status: DC | PRN
  Administered 2020-10-20: 40 mg via INTRAVENOUS
  Filled 2020-10-20: qty 8

## 2020-10-20 MED ORDER — KETOROLAC TROMETHAMINE 30 MG/ML IJ SOLN
30.0000 mg | Freq: Four times a day (QID) | INTRAMUSCULAR | Status: AC
  Administered 2020-10-20 – 2020-10-21 (×3): 30 mg via INTRAVENOUS
  Filled 2020-10-20 (×4): qty 1

## 2020-10-20 MED ORDER — ALBUTEROL SULFATE (2.5 MG/3ML) 0.083% IN NEBU: 3.0000 mL | INHALATION_SOLUTION | Freq: Four times a day (QID) | RESPIRATORY_TRACT | Status: DC | PRN

## 2020-10-20 MED ORDER — TERBUTALINE SULFATE 1 MG/ML IJ SOLN
0.2500 mg | Freq: Once | INTRAMUSCULAR | Status: AC | PRN
  Administered 2020-10-20: 0.25 mg via SUBCUTANEOUS
  Filled 2020-10-20: qty 1

## 2020-10-20 MED ORDER — NALOXONE HCL 0.4 MG/ML IJ SOLN: 0.4000 mg | INTRAMUSCULAR | Status: DC | PRN

## 2020-10-20 MED ORDER — OXYTOCIN-SODIUM CHLORIDE 30-0.9 UT/500ML-% IV SOLN
2.5000 [IU]/h | INTRAVENOUS | Status: DC
  Administered 2020-10-20: 30 [IU] via INTRAVENOUS

## 2020-10-20 MED ORDER — OXYCODONE HCL 5 MG PO TABS: 5.0000 mg | ORAL_TABLET | ORAL | Status: DC | PRN

## 2020-10-20 MED ORDER — LABETALOL HCL 5 MG/ML IV SOLN
20.0000 mg | INTRAVENOUS | Status: DC | PRN
  Administered 2020-10-20: 20 mg via INTRAVENOUS
  Filled 2020-10-20: qty 4

## 2020-10-20 MED ORDER — SODIUM CHLORIDE 0.9 % IR SOLN
Status: DC | PRN
  Administered 2020-10-20: 1000 mL

## 2020-10-20 MED ORDER — ZOLPIDEM TARTRATE 5 MG PO TABS: 5.0000 mg | ORAL_TABLET | Freq: Every evening | ORAL | Status: DC | PRN

## 2020-10-20 MED ORDER — SIMETHICONE 80 MG PO CHEW
80.0000 mg | CHEWABLE_TABLET | ORAL | Status: DC | PRN
  Administered 2020-10-22: 80 mg via ORAL
  Filled 2020-10-20: qty 1

## 2020-10-20 MED ORDER — HYDRALAZINE HCL 20 MG/ML IJ SOLN
10.0000 mg | INTRAMUSCULAR | Status: DC | PRN
  Administered 2020-10-20: 10 mg via INTRAVENOUS
  Filled 2020-10-20: qty 1

## 2020-10-20 MED ORDER — DEXAMETHASONE SODIUM PHOSPHATE 10 MG/ML IJ SOLN
INTRAMUSCULAR | Status: AC
  Filled 2020-10-20: qty 1

## 2020-10-20 MED ORDER — OXYCODONE HCL 5 MG/5ML PO SOLN: 5.0000 mg | Freq: Once | ORAL | Status: DC | PRN

## 2020-10-20 MED ORDER — OXYCODONE-ACETAMINOPHEN 5-325 MG PO TABS: 2.0000 | ORAL_TABLET | ORAL | Status: DC | PRN

## 2020-10-20 MED ORDER — MEPERIDINE HCL 25 MG/ML IJ SOLN: 6.2500 mg | INTRAMUSCULAR | Status: DC | PRN

## 2020-10-20 MED ORDER — MORPHINE SULFATE (PF) 0.5 MG/ML IJ SOLN
INTRAMUSCULAR | Status: AC
  Filled 2020-10-20: qty 10

## 2020-10-20 MED ORDER — CEFAZOLIN SODIUM-DEXTROSE 2-3 GM-%(50ML) IV SOLR
INTRAVENOUS | Status: DC | PRN
  Administered 2020-10-20: 2 g via INTRAVENOUS

## 2020-10-20 MED ORDER — KETOROLAC TROMETHAMINE 30 MG/ML IJ SOLN
30.0000 mg | Freq: Once | INTRAMUSCULAR | Status: AC | PRN
  Administered 2020-10-20: 30 mg via INTRAVENOUS

## 2020-10-20 MED ORDER — ONDANSETRON HCL 4 MG/2ML IJ SOLN: 4.0000 mg | Freq: Three times a day (TID) | INTRAMUSCULAR | Status: DC | PRN

## 2020-10-20 MED ORDER — HYDROMORPHONE HCL 1 MG/ML IJ SOLN
0.2500 mg | INTRAMUSCULAR | Status: DC | PRN
  Administered 2020-10-20: 0.5 mg via INTRAVENOUS

## 2020-10-20 MED ORDER — OXYTOCIN-SODIUM CHLORIDE 30-0.9 UT/500ML-% IV SOLN: 2.5000 [IU]/h | INTRAVENOUS | Status: AC

## 2020-10-20 MED ORDER — BUPIVACAINE IN DEXTROSE 0.75-8.25 % IT SOLN
INTRATHECAL | Status: DC | PRN
  Administered 2020-10-20: 1.4 mL via INTRATHECAL

## 2020-10-20 MED ORDER — LORATADINE 10 MG PO TABS: 10.0000 mg | ORAL_TABLET | Freq: Every day | ORAL | Status: DC | PRN

## 2020-10-20 MED ORDER — TETANUS-DIPHTH-ACELL PERTUSSIS 5-2.5-18.5 LF-MCG/0.5 IM SUSY: 0.5000 mL | PREFILLED_SYRINGE | Freq: Once | INTRAMUSCULAR | Status: DC

## 2020-10-20 MED ORDER — NALBUPHINE HCL 10 MG/ML IJ SOLN
5.0000 mg | INTRAMUSCULAR | Status: DC | PRN
Start: 2020-10-20 — End: 2020-10-23
  Administered 2020-10-20 (×2): 5 mg via INTRAVENOUS
  Filled 2020-10-20 (×2): qty 1

## 2020-10-20 MED ORDER — DIBUCAINE (PERIANAL) 1 % EX OINT: 1.0000 "application " | TOPICAL_OINTMENT | CUTANEOUS | Status: DC | PRN

## 2020-10-20 MED ORDER — MENTHOL 3 MG MT LOZG: 1.0000 | LOZENGE | OROMUCOSAL | Status: DC | PRN

## 2020-10-20 MED ORDER — MAGNESIUM SULFATE BOLUS VIA INFUSION
6.0000 g | Freq: Once | INTRAVENOUS | Status: AC
  Administered 2020-10-20: 6 g via INTRAVENOUS
  Filled 2020-10-20: qty 1000

## 2020-10-20 MED ORDER — FENTANYL CITRATE (PF) 100 MCG/2ML IJ SOLN: INTRAMUSCULAR | Status: DC | PRN

## 2020-10-20 MED ORDER — METHYLPREDNISOLONE SODIUM SUCC 125 MG IJ SOLR
INTRAMUSCULAR | Status: DC | PRN
  Administered 2020-10-20: 125 mg via INTRAVENOUS

## 2020-10-20 MED ORDER — NALOXONE HCL 4 MG/10ML IJ SOLN
1.0000 ug/kg/h | INTRAVENOUS | Status: DC | PRN
  Filled 2020-10-20: qty 5

## 2020-10-20 MED ORDER — SODIUM CHLORIDE 0.9% FLUSH: 3.0000 mL | INTRAVENOUS | Status: DC | PRN

## 2020-10-20 MED ORDER — FAMOTIDINE IN NACL 20-0.9 MG/50ML-% IV SOLN
20.0000 mg | Freq: Once | INTRAVENOUS | Status: AC
  Administered 2020-10-20: 20 mg via INTRAVENOUS
  Filled 2020-10-20: qty 50

## 2020-10-20 MED ORDER — ACETAMINOPHEN 500 MG PO TABS: 1000.0000 mg | ORAL_TABLET | Freq: Four times a day (QID) | ORAL | Status: DC

## 2020-10-20 MED ORDER — PHENYLEPHRINE HCL-NACL 20-0.9 MG/250ML-% IV SOLN
INTRAVENOUS | Status: DC | PRN
  Administered 2020-10-20: 60 ug/min via INTRAVENOUS

## 2020-10-20 MED ORDER — SENNOSIDES-DOCUSATE SODIUM 8.6-50 MG PO TABS
2.0000 | ORAL_TABLET | ORAL | Status: DC
  Administered 2020-10-20 – 2020-10-23 (×4): 2 via ORAL
  Filled 2020-10-20 (×4): qty 2

## 2020-10-20 MED ORDER — DIPHENHYDRAMINE HCL 50 MG/ML IJ SOLN: 12.5000 mg | INTRAMUSCULAR | Status: DC | PRN

## 2020-10-20 MED ORDER — PHENYLEPHRINE HCL (PRESSORS) 10 MG/ML IV SOLN
INTRAVENOUS | Status: DC | PRN
  Administered 2020-10-20 (×8): 80 ug via INTRAVENOUS

## 2020-10-20 MED ORDER — SCOPOLAMINE 1 MG/3DAYS TD PT72
MEDICATED_PATCH | TRANSDERMAL | Status: AC
  Filled 2020-10-20: qty 1

## 2020-10-20 MED ORDER — DIPHENHYDRAMINE HCL 50 MG/ML IJ SOLN
INTRAMUSCULAR | Status: DC | PRN
  Administered 2020-10-20: 12.5 mg via INTRAVENOUS
  Administered 2020-10-20: 25 mg via INTRAVENOUS
  Administered 2020-10-20: 12.5 mg via INTRAVENOUS

## 2020-10-20 MED ORDER — HYDROMORPHONE HCL 1 MG/ML IJ SOLN
INTRAMUSCULAR | Status: AC
  Filled 2020-10-20: qty 0.5

## 2020-10-20 MED ORDER — SOD CITRATE-CITRIC ACID 500-334 MG/5ML PO SOLN
30.0000 mL | ORAL | Status: DC | PRN
  Administered 2020-10-20: 30 mL via ORAL
  Filled 2020-10-20: qty 15

## 2020-10-20 MED ORDER — LACTATED RINGERS IV SOLN: 500.0000 mL | INTRAVENOUS | Status: DC | PRN

## 2020-10-20 MED ORDER — LIDOCAINE HCL (PF) 1 % IJ SOLN: 30.0000 mL | INTRAMUSCULAR | Status: DC | PRN

## 2020-10-20 SURGICAL SUPPLY — 31 items
BENZOIN TINCTURE PRP APPL 2/3 (GAUZE/BANDAGES/DRESSINGS) ×2 IMPLANT
CHLORAPREP W/TINT 26ML (MISCELLANEOUS) ×2 IMPLANT
CLAMP CORD UMBIL (MISCELLANEOUS) IMPLANT
CLOSURE STERI-STRIP 1/4X4 (GAUZE/BANDAGES/DRESSINGS) ×2 IMPLANT
CLOTH BEACON ORANGE TIMEOUT ST (SAFETY) ×2 IMPLANT
DRSG OPSITE POSTOP 4X10 (GAUZE/BANDAGES/DRESSINGS) ×2 IMPLANT
ELECT REM PT RETURN 9FT ADLT (ELECTROSURGICAL) ×2
ELECTRODE REM PT RTRN 9FT ADLT (ELECTROSURGICAL) ×1 IMPLANT
EXTRACTOR VACUUM KIWI (MISCELLANEOUS) IMPLANT
EXTRACTOR VACUUM M CUP 4 TUBE (SUCTIONS) IMPLANT
GLOVE BIOGEL PI IND STRL 7.0 (GLOVE) ×1 IMPLANT
GLOVE BIOGEL PI INDICATOR 7.0 (GLOVE) ×1
GLOVE ORTHO TXT STRL SZ7.5 (GLOVE) ×2 IMPLANT
GOWN STRL REUS W/TWL LRG LVL3 (GOWN DISPOSABLE) ×4 IMPLANT
KIT ABG SYR 3ML LUER SLIP (SYRINGE) IMPLANT
NEEDLE HYPO 25X5/8 SAFETYGLIDE (NEEDLE) ×2 IMPLANT
NS IRRIG 1000ML POUR BTL (IV SOLUTION) ×2 IMPLANT
PACK C SECTION WH (CUSTOM PROCEDURE TRAY) ×2 IMPLANT
PAD OB MATERNITY 4.3X12.25 (PERSONAL CARE ITEMS) ×2 IMPLANT
PENCIL SMOKE EVAC W/HOLSTER (ELECTROSURGICAL) ×2 IMPLANT
RTRCTR C-SECT PINK 25CM LRG (MISCELLANEOUS) ×2 IMPLANT
SUT CHROMIC 1 CTX 36 (SUTURE) ×4 IMPLANT
SUT PLAIN 0 NONE (SUTURE) IMPLANT
SUT PLAIN 2 0 XLH (SUTURE) IMPLANT
SUT VIC AB 0 CT1 27 (SUTURE) ×2
SUT VIC AB 0 CT1 27XBRD ANBCTR (SUTURE) ×2 IMPLANT
SUT VIC AB 2-0 CT1 (SUTURE) ×2 IMPLANT
SUT VIC AB 4-0 KS 27 (SUTURE) IMPLANT
TOWEL OR 17X24 6PK STRL BLUE (TOWEL DISPOSABLE) ×2 IMPLANT
TRAY FOLEY W/BAG SLVR 14FR LF (SET/KITS/TRAYS/PACK) ×2 IMPLANT
WATER STERILE IRR 1000ML POUR (IV SOLUTION) ×2 IMPLANT

## 2020-10-20 NOTE — Transfer of Care (Signed)
Immediate Anesthesia Transfer of Care Note  Patient: Tina [name]  Procedure(s) Performed: CESAREAN SECTION (N/A )  Patient Location: PACU  Anesthesia Type:Spinal  Level of Consciousness: awake, alert , oriented and patient cooperative  Airway & Oxygen Therapy: Patient Spontanous Breathing  Post-op Assessment: Report given to RN, Post -op Vital signs reviewed and stable and Patient moving all extremities X 4  Post vital signs: Reviewed and stable  Last Vitals:  Vitals Value Taken Time  BP 98/57 10/20/20 0430  Temp    Pulse 80 10/20/20 0432  Resp 18 10/20/20 0432  SpO2 98 % 10/20/20 0432  Vitals shown include unvalidated device data.  Last Pain:  Vitals:   10/20/20 0142  TempSrc:   PainSc: 4          Complications: No complications documented.

## 2020-10-20 NOTE — H&P (Signed)
Tina [name] is a 35 y.o. female, G1 P0, EGA 40+ weeks with EDC 4-8 presenting for induction.  PNC complicated by anemia-received IV iron, GERD, asthma.  She received a dose of cytotec around 0100.  Since then [facility] with prolonged and late decels.  Also with BP elevated to severe range, has been given IV labetalol and started on magnesium.  OB History    Gravida  1   Para  0   Term  0   Preterm  0   AB  0   Living  0     SAB  0   IAB  0   Ectopic  0   Multiple  0   Live Births  0          Past Medical History:  Diagnosis Date  . Allergy   . Asthma   . Heart murmur    Past Surgical History:  Procedure Laterality Date  . BREAST EXCISIONAL BIOPSY Left 2007  . COLPOSCOPY    . LAPAROSCOPY    . WISDOM TOOTH EXTRACTION     Family History: family history includes Allergic rhinitis in her sister; Asthma in her father and maternal uncle; Breast cancer in her maternal grandmother and paternal grandmother; Cancer in her maternal aunt; Diabetes in her father, maternal aunt, maternal aunt, maternal aunt, mother, paternal grandfather, and paternal grandmother; Eczema in her sister; Food Allergy in her sister; Hypertension in her father and maternal aunt; Lupus in her maternal aunt. Social History:  reports that she has quit smoking. Her smoking use included e-cigarettes. She has never used smokeless tobacco. She reports previous alcohol use. She reports that she does not use drugs.     Maternal Diabetes: No Genetic Screening: Normal Maternal Ultrasounds/Referrals: Normal Fetal Ultrasounds or other Referrals:  None Maternal Substance Abuse:  No Significant Maternal Medications:  None Significant Maternal Lab Results:  Group B Strep negative Other Comments:  None  Review of Systems  Respiratory: Negative.   Cardiovascular: Negative.    Maternal Medical History:  Fetal activity: Perceived fetal activity is normal.    Prenatal complications: no prenatal  complications Prenatal Complications - Diabetes: none.    Dilation: Closed Effacement (%): Thick Station: Ballotable Exam by:: Conservation officer, nature Blood pressure (!) 171/89, pulse 96, temperature 98.7 F (37.1 C), temperature source Oral, resp. rate 20, height 4\' 11"  (1.499 m), weight 69.5 kg. Maternal Exam:  Uterine Assessment: Contraction strength is mild.  Contraction frequency is irregular.   Abdomen: Patient reports no abdominal tenderness. Fetal presentation: vertex  Introitus: Normal vulva. Normal vagina.  Amniotic fluid character: not assessed.  Pelvis: adequate for delivery.      Fetal Exam Fetal Monitor Review: Mode: ultrasound.   Baseline rate: 120-130.  Variability: moderate (6-25 bpm).   Pattern: late decelerations and prolonged decelerations.    Fetal State Assessment: Category II - tracings are indeterminate.     Physical Exam Vitals reviewed.  Constitutional:      Appearance: Normal appearance.  Cardiovascular:     Rate and Rhythm: Normal rate and regular rhythm.  Pulmonary:     Effort: Pulmonary effort is normal. No respiratory distress.  Abdominal:     Palpations: Abdomen is soft.  Genitourinary:    General: Normal vulva.  Neurological:     Mental Status: She is alert.     Prenatal labs: ABO, Rh: --/--/B POS (04/13 0035) Antibody: NEG (04/13 0035) Rubella:  immune RPR:   NR HBsAg:  neg  HIV:   NR  GBS:   neg  Assessment/Plan: IUP at 40+ weeks admitted for ripening/induction, preeclampsia, persistent Cat II FHT.  She is on magnesium for seizure prophylaxis.  Discussed c-section procedure and risks for FHR decels.  To OR for urgent c-section.     [name] 10/20/2020, 3:02 AM

## 2020-10-20 NOTE — Anesthesia Preprocedure Evaluation (Signed)
Anesthesia Evaluation  Patient identified by MRN, date of birth, ID band Patient awake    Reviewed: Allergy & Precautions, NPO status , Patient's Chart, lab work & pertinent test results  Airway Mallampati: II  TM Distance: >3 FB Neck ROM: Full    Dental no notable dental hx.    Pulmonary asthma , former smoker,    Pulmonary exam normal breath sounds clear to auscultation       Cardiovascular hypertension (severely hypertensive from preE- BP 208/110 on arrival- has maxed out on both labetalol and hydralazine protocols per nursing ), Normal cardiovascular exam Rhythm:Regular Rate:Normal     Neuro/Psych negative neurological ROS  negative psych ROS   GI/Hepatic Neg liver ROS, GERD  Controlled and Medicated,  Endo/Other  negative endocrine ROS  Renal/GU negative Renal ROS  negative genitourinary   Musculoskeletal negative musculoskeletal ROS (+)   Abdominal   Peds negative pediatric ROS (+)  Hematology negative hematology ROS (+)   Anesthesia Other Findings   Reproductive/Obstetrics (+) Pregnancy Severe preE- extremely hypertensive on arrival, mag bolus recently given Multiple long decelerations w/ cytotec still in place- decision for urgent c section                             Anesthesia Physical Anesthesia Plan  ASA: III and emergent  Anesthesia Plan: Spinal   Post-op Pain Management:    Induction:   PONV Risk Score and Plan: 2 and Propofol infusion and TIVA  Airway Management Planned: Natural Airway and Nasal Cannula  Additional Equipment: None  Intra-op Plan:   Post-operative Plan:   Informed Consent: I have reviewed the patients History and Physical, chart, labs and discussed the procedure including the risks, benefits and alternatives for the proposed anesthesia with the patient or authorized representative who has indicated his/her understanding and acceptance.        Plan Discussed with: CRNA  Anesthesia Plan Comments:         Anesthesia Quick Evaluation

## 2020-10-20 NOTE — Lactation Note (Signed)
This note was copied from a baby's chart. Lactation Consultation Note  Patient Name: Tina [name] YWVPX'T Date: 10/20/2020 Reason for consult: Initial assessment;Primapara;1st time breastfeeding;Term;Infant < 6lbs Age:34 hours   LC in to visit with P1 and FOB of SGA baby at term.  Baby's birth weight 5 lbs. 1.3 oz. Mom had C/S and is currently on MgSO4 and very tired.   Baby swaddled in crib.  Baby fed 17 ml of Similac by bottle at 0500.    Basic education on breastfeeding reviewed.  Offered to assist with STS and baby latching to breast. Mom wanted to try.  LC removed swaddle and placed baby STS on Mom's chest.  Baby immediately started cueing.  Demonstrated breast massage and hand expression,unable to visualize colostrum currently.  Assistance given in laid back position.  Baby able to latch with LC assistance sandwiching breast.  Baby sucked for about 30 seconds and then slipped off breast needing to re-latch again.  FOB very involved and showed him what a deep latch was.  Baby unable to sustain latch before falling asleep on Mom's chest.  LC set up DEBP and offered to assist Mom with first pumping.  Mom was too tired at this point.  Mom aware that her RN can assist with first pumping.  24 mm flanges appear to be correct size.  FOB and sister taught importance of disassembling pump parts, washing and rinsing and air drying in separate bin provided.  Plan (written on dry erase board) 1- Keep baby STS as much as possible 2- Offer breast with cues, asking for help prn 3- Offer supplement of 22 cal formula by paced bottle if baby unable to sustain a deep latch and feed consistently for >10 mins 4- Pump both breasts on initiation setting  LC will follow-up daily.  Baby able to attain a brief latch to breast. A nipple shield may be needed, but due to being first day and Mom being sleepy, LC held off.  Explained to Mom that it would be important for baby to first try latching without nipple  shield.  Talked about pre-pumping to draw nipple out, but Mom fell asleep.                       Lactation brochure provided and [facility] referral faxed.          LATCH Score Latch: Repeated attempts needed to sustain latch, nipple held in mouth throughout feeding, stimulation needed to elicit sucking reflex.  Audible Swallowing: None  Type of Nipple: Flat  Comfort (Breast/Nipple): Soft / non-tender  Hold (Positioning): Full assist, staff holds infant at breast  LATCH Score: 4   Lactation Tools Discussed/Used Tools: Pump;Flanges Flange Size: 24 Breast pump type: Double-Electric Breast Pump Pump Education: Setup, frequency, and cleaning;Milk Storage Reason for Pumping: Support milk supply/infant <6 lbs Pumping frequency: Q 3h after breastfeeding Pumped volume: 0 mL  Interventions Interventions: Breast feeding basics reviewed;Assisted with latch;Skin to skin;Breast massage;Hand express;Pre-pump if needed;Position options;DEBP  Discharge Pump: DEBP WIC Program: Yes  Consult Status Consult Status: Follow-up Date: 10/21/20 Follow-up type: In-patient    Tina [name] 10/20/2020, 9:40 AM

## 2020-10-20 NOTE — Op Note (Signed)
Preoperative diagnosis: Intrauterine pregnancy at 40 weeks, FHR decelerations, preeclampsia Postoperative diagnosis: Same, thick meconium Procedure: Primary low transverse cesarean section without extensions Surgeon: Lavina Hamman M.D. Anesthesia: Spinal  Findings: Patient had normal gravid anatomy and delivered a viable female infant with Apgars of 3, 5 and 9 weight pending Estimated blood loss: 400 cc Specimens: Placenta sent for routine pathology Complications: None  Procedure in detail: The patient was taken to the operating room and placed in the sitting position. The anesthesiologist quickly instilled spinal anesthesia.  She was then placed in the dorsosupine position with left tilt. Abdomen was then prepped and draped in the usual sterile fashion, and a foley catheter was inserted. The level of her anesthesia was found to be adequate. Abdomen was entered via a standard Pfannenstiel incision. Once the peritoneal cavity was entered the Alexis disposable self-retaining retractor was placed and good visualization was achieved. A 4 cm transverse incision was then made in the lower uterine segment pushing the bladder inferior. Once the uterine cavity was entered the incision was extended digitally, thick, meconium stained amniotic fluid. The fetal vertex was grasped and delivered through the incision atraumatically. Mouth and nares were suctioned. The remainder of the infant then delivered atraumatically. Cord was doubly clamped and cut after one minute and the infant handed to the awaiting pediatric team. The placenta and cord were very meconium stained.  I was unable to get an arterial cord gas and only minimal cord blood-there was just not much blood in the umbilical cord. The placenta delivered spontaneously. Uterus was wiped dry with clean lap pad and all clots and debris were removed. Uterine incision was inspected and found to be free of extensions. Uterine incision was closed in 1 layer with  running locking #1 Chromic. Tubes and ovaries were inspected and found to be normal. Uterine incision was inspected and found to be hemostatic. Bleeding from serosal edges was controlled with electrocautery. The Alexis retractor was removed. Subfascial space was irrigated and made hemostatic with electrocautery. Peritoneum was closed with 2-0 Vicryl.  Fascia was closed in running fashion starting at both ends and meeting in the middle with 0 Vicryl. Subcutaneous tissue was then irrigated and made hemostatic with electrocautery. Skin was closed with running 4-0 Vicryl subcuticular suture followed by steri-strips and a sterile dressing. Patient tolerated the procedure well and was taken to the recovery in stable condition. Counts were correct x2, she received Ancef 2 g IV at the beginning of the procedure and she had PAS hose on throughout the procedure.  At the end of the procedure she was noticed by nursing to have some facial edema, breathing ok.

## 2020-10-20 NOTE — Anesthesia Postprocedure Evaluation (Addendum)
Anesthesia Post Note  Patient: Ailed Defibaugh  Procedure(s) Performed: CESAREAN SECTION (N/A )     Patient location during evaluation: PACU Anesthesia Type: Spinal Level of consciousness: awake and alert and oriented Pain management: pain level controlled Vital Signs Assessment: post-procedure vital signs reviewed and stable Respiratory status: spontaneous breathing, nonlabored ventilation and respiratory function stable Cardiovascular status: blood pressure returned to baseline and stable Postop Assessment: no headache, no backache, spinal receding and no apparent nausea or vomiting Anesthetic complications: no Comments: Significant facial edema (no airway edema) that developed throughout the case. Most likely cause is ancef that was administered at the beginning. No significant improvement with one dose of benadryl (25mg ), pepcid IV and solucortef. Will give another dose of benadryl now and start q8 IV hydrocortisone; d/w Dr. .    No complications documented.  Last Vitals:  Vitals:   10/20/20 0218 10/20/20 0230  BP: (!) 165/96 (!) 171/89  Pulse: 80 96  Resp: 18 20  Temp:      Last Pain:  Vitals:   10/20/20 0142  TempSrc:   PainSc: 4    Pain Goal:                   10/22/20

## 2020-10-20 NOTE — Progress Notes (Addendum)
Patient ID: Tina [name], female   DOB: 1986/06/20, 35 y.o.   MRN: [id number] Chart check - BP wnl 121-132/72-76 UOP - 1L over 12hrs   Plan - continue current care Stop MgSo4 at 345am

## 2020-10-20 NOTE — Progress Notes (Addendum)
Subjective: Postpartum Day 0: Cesarean Delivery Patient reports tolerating PO and + flatus.  Pain well controlled. She denies HA, CP or SOB she does feel quite fatigued and itchy. She is bonding well with son.   Objective: Vital signs in last 24 hours: Temp:  [86.54 F (30.3 C)-98.7 F (37.1 C)] 98.3 F (36.8 C) (04/13 1202) Pulse Rate:  [71-96] 86 (04/13 1202) Resp:  [11-22] 17 (04/13 1202) BP: (98-203)/(51-114) 121/76 (04/13 1202) SpO2:  [94 %-100 %] 97 % (04/13 1202) Weight:  [69.5 kg] 69.5 kg (04/13 0043)     Physical Exam:  General: alert, cooperative and no distress Lochia: appropriate Uterine Fundus: firm Incision: no significant drainage DVT Evaluation: No evidence of DVT seen on physical exa; SCDs in place  Recent Labs    10/20/20 0017 10/20/20 0841  HGB 11.1* 12.0  HCT 33.5* 36.7    Assessment/Plan: Status post Cesarean section. Doing well postoperatively.  Continue on MgSO4 till 345am 10/21/20: UOP 325/past 5hrs Will monitor BP afterwards to see if needs medication  They desire circumcision for baby.  Cathrine Muster 10/20/2020, 1:00 PM

## 2020-10-20 NOTE — Anesthesia Procedure Notes (Signed)
Spinal  Patient location during procedure: OR Start time: 10/20/2020 3:09 AM End time: 10/20/2020 3:12 AM Reason for block: surgical anesthesia Staffing Performed: anesthesiologist  Anesthesiologist: Lannie Fields, DO Preanesthetic Checklist Completed: patient identified, IV checked, risks and benefits discussed, surgical consent, monitors and equipment checked, pre-op evaluation and timeout performed Spinal Block Patient position: sitting Prep: DuraPrep and site prepped and draped Patient monitoring: cardiac monitor, continuous pulse ox and blood pressure Approach: midline Location: L3-4 Injection technique: single-shot Needle Needle type: Pencan  Needle gauge: 24 G Needle length: 9 cm Assessment Sensory level: T6 Events: CSF return Additional Notes Functioning IV was confirmed and monitors were applied. Sterile prep and drape, including hand hygiene and sterile gloves were used. The patient was positioned and the spine was prepped. The skin was anesthetized with lidocaine.  Free flow of clear CSF was obtained prior to injecting local anesthetic into the CSF.  The spinal needle aspirated freely following injection.  The needle was carefully withdrawn.  The patient tolerated the procedure well.

## 2020-10-20 NOTE — Plan of Care (Signed)
  Problem: Education: Goal: Knowledge of General Education information will improve Description: Including pain rating scale, medication(s)/side effects and non-pharmacologic comfort measures Outcome: Progressing   Problem: Health Behavior/Discharge Planning: Goal: Ability to manage health-related needs will improve Outcome: Progressing   Problem: Clinical Measurements: Goal: Ability to maintain clinical measurements within normal limits will improve Outcome: Progressing Goal: Will remain free from infection Outcome: Progressing Goal: Respiratory complications will improve Outcome: Progressing Goal: Cardiovascular complication will be avoided Outcome: Progressing   Problem: Activity: Goal: Risk for activity intolerance will decrease Outcome: Progressing   Problem: Nutrition: Goal: Adequate nutrition will be maintained Outcome: Progressing   Problem: Coping: Goal: Level of anxiety will decrease Outcome: Progressing   Problem: Elimination: Goal: Will not experience complications related to urinary retention Outcome: Progressing   Problem: Safety: Goal: Ability to remain free from injury will improve Outcome: Progressing   Problem: Skin Integrity: Goal: Risk for impaired skin integrity will decrease Outcome: Progressing   Problem: Education: Goal: Knowledge of disease or condition will improve Outcome: Progressing Goal: Knowledge of the prescribed therapeutic regimen will improve Outcome: Progressing   Problem: Education: Goal: Knowledge of condition will improve Outcome: Progressing   Problem: Activity: Goal: Will verbalize the importance of balancing activity with adequate rest periods Outcome: Progressing Goal: Ability to tolerate increased activity will improve Outcome: Progressing   Problem: Role Relationship: Goal: Ability to demonstrate positive interaction with newborn will improve Outcome: Progressing

## 2020-10-21 MED ORDER — NIFEDIPINE ER OSMOTIC RELEASE 30 MG PO TB24
30.0000 mg | ORAL_TABLET | Freq: Every day | ORAL | Status: DC
  Administered 2020-10-21: 30 mg via ORAL
  Filled 2020-10-21: qty 1

## 2020-10-21 MED ORDER — NIFEDIPINE ER OSMOTIC RELEASE 30 MG PO TB24
30.0000 mg | ORAL_TABLET | Freq: Once | ORAL | Status: AC
  Administered 2020-10-21: 30 mg via ORAL
  Filled 2020-10-21: qty 1

## 2020-10-21 MED ORDER — NIFEDIPINE ER OSMOTIC RELEASE 30 MG PO TB24
60.0000 mg | ORAL_TABLET | Freq: Every day | ORAL | Status: DC
  Administered 2020-10-22 – 2020-10-23 (×2): 60 mg via ORAL
  Filled 2020-10-21 (×2): qty 2

## 2020-10-21 NOTE — Lactation Note (Signed)
This note was copied from a baby's chart. Lactation Consultation Note Baby 24 hrs old at time of consult. Mom holding baby stating baby will not BF. Mom is flat non-compressible. Breast are full/heavy. Probably edema. Mom has generalized edema. Mom has DEBP set up in rm but hasn't used it during the night. Discussed pumping w/mom.   LC got #16, #20 NS. NS#20 fits better. Latched baby fed well. NS dry when baby came off of breast. Gave mom LPI information sheet about supplementing since less than 6 LBS. Explained to mom if baby isn't BF or going to breast the baby needs more than the supplemental amount. If mom sees that the NS is dry and baby isn't getting anything from the breast baby needs more than supplemental amount. Mom stated understanding.  Shells given to mom. Mom has small flat nipples and areola. Non-compressible. No colostrum noted.  Baby BF well w/#20 NS then gave 22 ml Similac 22 cal. Mom pumped while LC fed baby formula. Instructed how to feed the baby. Then gave mom the baby to hold while milk digest and helps prevent baby from spitting up after feeding.  Mom stated baby weight was 5.2 lbs baby has gained 1 oz. Encouraged mom to call for assistance or questions.  Patient Name: Tina [name] NATFT'D Date: 10/21/2020 Reason for consult: Follow-up assessment;Term;Primapara Age:34 hours  Maternal Data Has patient been taught Hand Expression?: Yes Does the patient have breastfeeding experience prior to this delivery?: No  Feeding Mother's Current Feeding Choice: Breast Milk and Formula Nipple Type: Slow - flow  LATCH Score Latch: Grasps breast easily, tongue down, lips flanged, rhythmical sucking.  Audible Swallowing: None  Type of Nipple: Flat  Comfort (Breast/Nipple): Filling, red/small blisters or bruises, mild/mod discomfort (breast tight/full feeling (edema?))  Hold (Positioning): Assistance needed to correctly position infant at breast and maintain  latch.  LATCH Score: 5   Lactation Tools Discussed/Used Tools: Pump;Shells;Flanges;Nipple Shields Nipple shield size: 20 Flange Size: 21 Breast pump type: Double-Electric Breast Pump  Interventions Interventions: Breast feeding basics reviewed;Support pillows;Assisted with latch;Position options;Skin to skin;Breast massage;Shells;Hand express;Pre-pump if needed;Reverse pressure;DEBP;Breast compression;Adjust position  Discharge    Consult Status Consult Status: Follow-up Date: 10/22/20 Follow-up type: In-patient    Charyl Dancer 10/21/2020, 5:27 AM

## 2020-10-21 NOTE — Progress Notes (Signed)
Patient ID: Tina [name], female   DOB: [birth year], 35 y.o.   MRN: [id number] Pt had some elevated BP again to 150/90's Gave additional 30mg  XL procardia and increased AM dose to 60mg  for tomorrow.

## 2020-10-21 NOTE — Progress Notes (Signed)
Patient ID: Tina [name], female   DOB: [birth year], 35 y.o.   MRN: [id number] Pt tired but otherwise reports no complaints. She denies HA, CP or SOB. She is attempting pumping at this time. She admits she has not slept much.  VS: 146/83, 86 GEN - fatigued, NAD EXAM - deferred  A/P: Routine pp.post op care         Start on procardia 30xl now.   -Pt advised may have HAs next 1-3 days; dosage may need titrated         She reiterates desire for circumcision for baby

## 2020-10-21 NOTE — Progress Notes (Signed)
Subjective: Postpartum Day 1: Cesarean Delivery Patient reports tolerating PO and no problems voiding.  She is increasing ambulation and somewhat sore.  Working on Insurance claims handler and feeding.   Objective: Vital signs in last 24 hours: Temp:  [97.6 F (36.4 C)-98.4 F (36.9 C)] 98.2 F (36.8 C) (04/14 0753) Pulse Rate:  [79-95] 95 (04/14 0753) Resp:  [17-18] 18 (04/14 0753) BP: (121-154)/(70-84) 134/73 (04/14 0753) SpO2:  [97 %-99 %] 98 % (04/14 0753)  Physical Exam:  General: alert and cooperative Lochia: appropriate Uterine Fundus: firm Incision: C/D/I    Recent Labs    10/20/20 0017 10/20/20 0841  HGB 11.1* 12.0  HCT 33.5* 36.7    Assessment/Plan: Status post Cesarean section. Doing well postoperatively.   BP with some elevation after magnesium d/c at 400am but responded well to procardia XL 30mg  thus far.  Will monitor throughout day and titrate up if needed.  D/w pt. Called to schedule circumcision and peds wants to hold off until tomorrow and maybe Saturday given baby's smaller size and working on feeds.     Sunday 10/21/2020, 9:16 AM

## 2020-10-21 NOTE — Lactation Note (Signed)
This note was copied from a baby's chart. Lactation Consultation Note  Patient Name: Tina [name] Date: 10/21/2020 Reason for consult: Follow-up assessment;Term Age:34 hours  Follow up 31 hours old infant with 1.34% weight gain at the time of visit. Infant is sleeping in mother's arms at time of arrival. Mother states feedings are going well. Infant is going to breast, mother denies pain or discomfort with latch. Mother is pace-bottlefeeding when using formula and following guidelines.   Mother states she has been pumping but only getting a few drops. LC explained it can be normal and reinforced the importance of stimulating breast. Encouraged mother to rub EBM with a gloved finger.  Promoted self-care as part of mother's healing process.    Feeding plan:  1. Feed infant 8-12 time in 24h or following hunger cues.  2. Formula feed following volume guidelines, paced bottle feeding and fullness cues.   3. Pump or hand-express 8-12 times in 24h. 4. Monitor voids and stools as signs good intake 5. Encouraged maternal rest, hydration and food intake.  6. Contact Lactation Services or local resources for support, questions or concerns.     All questions answered at this time.   Maternal Data Has patient been taught Hand Expression?: Yes Does the patient have breastfeeding experience prior to this delivery?: No  Feeding Mother's Current Feeding Choice: Breast Milk and Formula Nipple Type: Slow - flow  Lactation Tools Discussed/Used Tools: Pump Nipple shield size: 20 Flange Size: 21 Breast pump type: Double-Electric Breast Pump Pumping frequency: Q3 Pumped volume:  (drops)  Interventions Interventions: Breast feeding basics reviewed;Skin to skin;Hand express;DEBP;Expressed milk;Education  Discharge Pump: DEBP WIC Program: Yes  Consult Status Consult Status: Follow-up Date: 10/22/20 Follow-up type: In-patient    Chelse Matas A Higuera Ancidey 10/21/2020, 10:40  AM

## 2020-10-22 LAB — SURGICAL PATHOLOGY

## 2020-10-22 MED ORDER — SIMETHICONE 80 MG PO CHEW
80.0000 mg | CHEWABLE_TABLET | Freq: Four times a day (QID) | ORAL | Status: DC
  Administered 2020-10-22 – 2020-10-23 (×5): 80 mg via ORAL
  Filled 2020-10-22 (×5): qty 1

## 2020-10-22 MED ORDER — DOCUSATE SODIUM 100 MG PO CAPS
100.0000 mg | ORAL_CAPSULE | Freq: Two times a day (BID) | ORAL | Status: DC
  Administered 2020-10-22 – 2020-10-23 (×3): 100 mg via ORAL
  Filled 2020-10-22 (×3): qty 1

## 2020-10-22 NOTE — Progress Notes (Signed)
POSTPARTUM POSTOP PROGRESS NOTE  POD #2  Subjective:  No acute events overnight.  Pt denies problems with ambulating, voiding or po intake.  She denies nausea or vomiting.  Pain is well controlled.  She has had flatus. She has not had bowel movement.  Lochia Minimal. Denies PreE symptoms. Reviewed that peds recommends holding circumcision again this AM until better feeding.   Objective: Blood pressure (!) 144/83, pulse 89, temperature 97.9 F (36.6 C), temperature source Oral, resp. rate 18, height 4\' 11"  (1.499 m), weight 69.5 kg, SpO2 100 %, currently breastfeeding.  Physical Exam:  General: alert, cooperative and no distress Lochia:normal flow Chest: CTAB Heart: RRR no m/r/g Abdomen: +BS, soft. Mild distention/tympany epigastrically Uterine Fundus: firm, 2cm below umbilicus. Honeycomb dressing intact, right aspect stable serosanguinous drainage Extremities: neg edema, neg calf TTP BL, neg Homans BL  Recent Labs    10/20/20 0017 10/20/20 0841  HGB 11.1* 12.0  HCT 33.5* 36.7    Assessment/Plan:  ASSESSMENT: Tina [name] is a 35 y.o. G1P1001 s/p PLTCS @ [redacted]w[redacted]d for persistent cat 2 tracing remote from delivery. PNC c/b PreE w/ SF s/p 24hrs MgSo4 for seizure ppx. Additionally, anemia s/p Feraheme  1) Postop - s/p flatus, pending BM, pain well-controlled, encourage ambulation and IS 2) PreE w/ SF: s/p MgSO4, on Procardia 60XL qAM beginning this morning, was increased from 30Xl    *Bps since 140s/90s    *Trend until POD#3, plan to DC on meds and f/u in 1wk for BP check  Desires circ for baby boy, however still poor feeding, hold circumcision likely until tomorrow   LOS: 2 days

## 2020-10-22 NOTE — Lactation Note (Signed)
This note was copied from a baby's chart. Lactation Consultation Note  Patient Name: Boy Solange Emry JSEGB'T Date: 10/22/2020 Reason for consult: Follow-up assessment;Primapara;1st time breastfeeding;Term Age:34 hours  Follow up visit to 62 hours old infant with 0.48% weight gain since birth. Mother is a primipara, first-time breastfeeding.   Mother states infant just finished ~42mL of EBM via bottle. Mother reports pumping is going well and collecting ~45mL of EBM. Mother explains she is pace bottlefeeding and burping with frequency. Mother mentions she is interested in latching but has had some challenges placing NS prior to latch.  Discussed feeding volume at infant's age (30-60 mL per feeding). LC encouraged mother to request assistance with latch and NS placement.  Praised mother for milk supply and efforts.   Feeding plan:  1-Skin to skin 2-Aim for a deep, comfortable latch 3-Breastfeeding on demand or 8-12 times in 24h period. 4-Keep infant awake during breastfeeding session: massaging breast, infant's hand/shoulder/feet 5-Pump or hand-express and offer EBM 6-If needed, supplement following guidelines, paced bottle feeding and fullness cues.  7-Monitor voids and stools as signs good intake.  8-Encouraged maternal rest, hydration and food intake.  9-Contact LC as needed for feeds/support/concerns/questions    Maternal Data Has patient been taught Hand Expression?: Yes Does the patient have breastfeeding experience prior to this delivery?: No  Feeding Mother's Current Feeding Choice: Breast Milk and Formula Nipple Type: Nfant Extra Slow Flow (gold)  Lactation Tools Discussed/Used Tools: Pump Nipple shield size: 20 Flange Size: 21 Breast pump type: Double-Electric Breast Pump Reason for Pumping: stimulation and supplementation Pumping frequency: Q3 Pumped volume: 30 mL  Interventions Interventions: Breast feeding basics reviewed;DEBP;Breast massage;Education;Skin to  skin (paced bottle feeding)  Consult Status Consult Status: Follow-up Date: 10/23/20 Follow-up type: In-patient  Joee Iovine A Higuera Ancidey 10/22/2020, 5:32 PM

## 2020-10-22 NOTE — Lactation Note (Signed)
This note was copied from a baby's chart. Lactation Consultation Note  Patient Name: Tina [name] BVAPO'L Date: 10/22/2020 Age:34 hours  Mom and baby sleeping upon visit. LC will come back to room at another time as possible.     Aleina Burgio A Higuera Ancidey 10/22/2020, 12:57 PM

## 2020-10-23 MED ORDER — IBUPROFEN 800 MG PO TABS: 800.0000 mg | ORAL_TABLET | Freq: Three times a day (TID) | ORAL | 1 refills | Status: DC

## 2020-10-23 MED ORDER — OXYCODONE HCL 5 MG PO TABS: 5.0000 mg | ORAL_TABLET | Freq: Four times a day (QID) | ORAL | 0 refills | Status: DC | PRN

## 2020-10-23 MED ORDER — NIFEDIPINE ER 60 MG PO TB24: 60.0000 mg | ORAL_TABLET | Freq: Every day | ORAL | 1 refills | Status: DC

## 2020-10-23 MED ORDER — DOCUSATE SODIUM 100 MG PO CAPS: 100.0000 mg | ORAL_CAPSULE | Freq: Two times a day (BID) | ORAL | 0 refills | Status: DC

## 2020-10-23 NOTE — Lactation Note (Signed)
This note was copied from a baby's chart. Lactation Consultation Note  Patient Name: Tina [name] GYBWL'S Date: 10/23/2020   [facility] provided [facility] Loaner pump [id number] 30 dollars collected with return date for pump on the first floor 11/04/2020. All questions answered at the end of the visit.  Mom required lavendar Nfant nipples provided by the NICU.  Engorgement protocol reviewed by LC earlier in the day according to Mother. LC provided written information on Engorgement. Mom also had LC brochure and was aware of all inpatient and outpatient services.    Age:34 days  Maternal Data    Feeding Nipple Type: Nfant Slow Flow (purple)  LATCH Score                    Lactation Tools Discussed/Used    Interventions    Discharge    Consult Status      Tina Messer  [name] 10/23/2020, 4:40 PM

## 2020-10-23 NOTE — Lactation Note (Signed)
This note was copied from a baby's chart. Lactation Consultation Note  Patient Name: Boy Alysiana Ethridge QASTM'H Date: 10/23/2020 Reason for consult: Follow-up assessment;Infant < 6lbs Age:34 hours  P1, Mother has not pumped since last night and her breasts are full. Attemped to latch without NS and baby struggled to sustain latch.  Applied #20NS and baby latched for 10 min and became sleepy. Breastmilk in NS at end of feeding. Mother then supplemented with formula. Suggest pumping and using her own milk to supplement. Encouraged mother to pump more frequently with a goal of q 3 hours. Mother states WIC has not called her.  Provided paperwork for [facility] loaner. Reviewed engorgement care and monitoring voids/stools.  Suggest calling when ready for [facility] loaner pump.   Feeding Mother's Current Feeding Choice: Breast Milk and Formula Nipple Type: Nfant Slow Flow (purple)  LATCH Score Latch: Grasps breast easily, tongue down, lips flanged, rhythmical sucking.  Audible Swallowing: A few with stimulation  Type of Nipple: Flat  Comfort (Breast/Nipple): Soft / non-tender  Hold (Positioning): Assistance needed to correctly position infant at breast and maintain latch.  LATCH Score: 7   Lactation Tools Discussed/Used Tools: Nipple Dorris Carnes;Pump Nipple shield size: 20 Flange Size: 24 Breast pump type: Double-Electric Breast Pump Reason for Pumping:  (stimulation and supplementation) Pumping frequency: goal q 3 hour actual random Pumped volume: 15 mL  Interventions Interventions: Breast feeding basics reviewed;Assisted with latch;Breast compression;Hand express;DEBP;Education  Discharge WIC Program: Yes  Consult Status Consult Status: Follow-up Date: 10/24/20 Follow-up type: In-patient    Dahlia Byes [facility] 10/23/2020, 8:15 AM

## 2020-10-23 NOTE — Discharge Summary (Signed)
Postpartum Discharge Summary  Date of Service updated     Patient Name: Tina [name] DOB: Feb 05, 1986 MRN: [id number]  Date of admission: 10/20/2020 Delivery date:10/20/2020  Delivering provider: Willis Modena, TODD  Date of discharge: 10/23/2020  Admitting diagnosis: Pregnancy [Z34.90] Intrauterine pregnancy: [redacted]w[redacted]d     Secondary diagnosis:  Active Problems:   Pregnancy  Additional problems: PreE w/ SF    Discharge diagnosis: Term Pregnancy Delivered and Preeclampsia (severe)                                              Post partum procedures:none Augmentation: Cytotec Complications: None  Hospital course: Onset of Labor With Unplanned C/S   35 y.o. yo G1P1001 at [redacted]w[redacted]d was admitted in Latent Labor on 10/20/2020. Patient had a labor course significant for IOL at 40 5/7. The patient went for cesarean section due to Non-Reassuring FHR and persistent cat 2 tracing remote from delivery; additionally met criteria for PreE w/ SF 2/2 severe range BP. Delivery details as follows: Membrane Rupture Time/Date: 3:30 AM ,10/20/2020   Delivery Method:C-Section, Low Transverse  Details of operation can be found in separate operative note. Patient had an uncomplicated postpartum course.  She is ambulating,tolerating a regular diet, passing flatus, and urinating well.  Patient is discharged home in stable condition 10/23/20.  Newborn Data: Birth date:10/20/2020  Birth time:3:31 AM  Gender:Female  Living status:Living  Apgars:3 ,5  Weight:2305 g   Magnesium Sulfate received: Yes: Seizure prophylaxis  Physical exam  Vitals:   10/22/20 1950 10/22/20 2334 10/23/20 0554 10/23/20 0734  BP: (!) 143/99 140/80 (!) 145/86 139/90  Pulse: 91 97 93 88  Resp: $Remo'20 18 18 18  'chOIa$ Temp: 98.2 F (36.8 C) 98.2 F (36.8 C) 98.1 F (36.7 C) 98.2 F (36.8 C)  TempSrc: Oral Oral Oral Oral  SpO2: 100% 100% 100% 100%  Weight:      Height:       General: alert, cooperative and no distress Lochia: appropriate Uterine  Fundus: firm Incision: Healing well with no significant drainage, No significant erythema, Dressing is clean, dry, and intact DVT Evaluation: No evidence of DVT seen on physical exam. Negative Homan's sign. No cords or calf tenderness. No significant calf/ankle edema. Labs: Lab Results  Component Value Date   WBC 16.0 (H) 10/20/2020   HGB 12.0 10/20/2020   HCT 36.7 10/20/2020   MCV 95.1 10/20/2020   PLT 176 10/20/2020   CMP Latest Ref Rng & Units 10/20/2020  Glucose 70 - 99 mg/dL 85  BUN 6 - 20 mg/dL 7  Creatinine 0.44 - 1.00 mg/dL 0.55  Sodium 135 - 145 mmol/L 134(L)  Potassium 3.5 - 5.1 mmol/L 4.1  Chloride 98 - 111 mmol/L 107  CO2 22 - 32 mmol/L 20(L)  Calcium 8.9 - 10.3 mg/dL 9.2  Total Protein 6.5 - 8.1 g/dL 5.7(L)  Total Bilirubin 0.3 - 1.2 mg/dL 0.5  Alkaline Phos 38 - 126 U/L 151(H)  AST 15 - 41 U/L 25  ALT 0 - 44 U/L 22   Edinburgh Score: Edinburgh Postnatal Depression Scale Screening Tool 10/21/2020  I have been able to laugh and see the funny side of things. 0  I have looked forward with enjoyment to things. 0  I have blamed myself unnecessarily when things went wrong. 2  I have been anxious or worried for no good reason. 2  I have felt scared or panicky for no good reason. 1  Things have been getting on top of me. 1  I have been so unhappy that I have had difficulty sleeping. 1  I have felt sad or miserable. 1  I have been so unhappy that I have been crying. 1  The thought of harming myself has occurred to me. 0  Edinburgh Postnatal Depression Scale Total 9      After visit meds:  Allergies as of 10/23/2020      Reactions   Banana Swelling   Throat swelling   Cefazolin Swelling   Facial edema (no airway edema) during c-section after ancef. Has not been formally tested for cephalosporin allergy.    Latex Hives, Swelling      Medication List    STOP taking these medications   pyridOXINE 50 MG tablet Commonly known as: B-6     TAKE these  medications   albuterol 108 (90 Base) MCG/ACT inhaler Commonly known as: VENTOLIN HFA 2 puffs every 4-6 hours as needed for coughing or wheezing spells   budesonide 32 MCG/ACT nasal spray Commonly known as: RHINOCORT AQUA Use 2 sprays in each nostril once a day as needed for stuffy nose.   docusate sodium 100 MG capsule Commonly known as: Colace Take 1 capsule (100 mg total) by mouth 2 (two) times daily.   EPINEPHrine 0.3 mg/0.3 mL Soaj injection Commonly known as: EpiPen 2-Pak Use as directed for severe allergic reactions What changed:   how much to take  how to take this  when to take this  reasons to take this   ibuprofen 800 MG tablet Commonly known as: ADVIL Take 1 tablet (800 mg total) by mouth every 8 (eight) hours.   NIFEdipine 60 MG 24 hr tablet Commonly known as: ADALAT CC Take 1 tablet (60 mg total) by mouth daily.   oxyCODONE 5 MG immediate release tablet Commonly known as: Oxy IR/ROXICODONE Take 1 tablet (5 mg total) by mouth every 6 (six) hours as needed for severe pain or breakthrough pain.   prenatal multivitamin Tabs tablet Take 1 tablet by mouth daily at 12 noon.   Pulmicort Flexhaler 180 MCG/ACT inhaler Generic drug: budesonide Inhale 2 puffs into the lungs 2 (two) times daily. Rinse, gargle and spit out after use.            Discharge Care Instructions  (From admission, onward)         Start     Ordered   10/23/20 0000  Leave dressing on - Keep it clean, dry, and intact until clinic visit        10/23/20 0802           Discharge home in stable condition Infant Feeding: both Infant Disposition:pending at time of discharge Discharge instruction: per After Visit Summary and Postpartum booklet. Activity: Advance as tolerated. Pelvic rest for 6 weeks.  Diet: low salt diet Anticipated Birth Control: Unsure Postpartum Appointment:6 weeks Additional Postpartum F/U: Incision check 1 week and BP check 1 week Future  Appointments: Future Appointments  Date Time Provider [place]  11/02/2020  9:30 AM Althea Charon, FNP [facility] None   Follow up Visit:      10/23/2020 Deliah Boston, MD

## 2020-10-23 NOTE — Progress Notes (Signed)
POSTPARTUM POSTOP PROGRESS NOTE  POD #3  Subjective:  No acute events overnight.  Pt denies problems with ambulating, voiding or po intake.  She denies nausea or vomiting.  Pain is well controlled.  She has had flatus. She has had bowel movement.  Lochia Minimal. Denies PreE symptoms. Peds recommendation pending regarding circumcision, per notes baby is taking more milk with Purple Nfant nipple.  Objective: Blood pressure 139/90, pulse 88, temperature 98.2 F (36.8 C), temperature source Oral, resp. rate 18, height 4\' 11"  (1.499 m), weight 69.5 kg, SpO2 100 %, currently breastfeeding.  Physical Exam:  General: alert, cooperative and no distress Lochia:normal flow Chest: CTAB Heart: RRR no m/r/g Abdomen: +BS, soft. Mild distention/tympany epigastrically, improved from yesterday Uterine Fundus: firm, 2cm below umbilicus. Honeycomb dressing intact, right aspect stable serosanguinous drainage Extremities: neg edema, neg calf TTP BL, neg Homans BL  Recent Labs    10/20/20 0841  HGB 12.0  HCT 36.7    Assessment/Plan:  ASSESSMENT: Tina [name] is a 35 y.o. G1P1001 s/p PLTCS @ [redacted]w[redacted]d for persistent cat 2 tracing remote from delivery. PNC c/b PreE w/ SF s/p 24hrs MgSo4 for seizure ppx. Additionally, anemia s/p Feraheme  1) Postop - s/p flatus, pending BM, pain well-controlled, encourage ambulation and IS 2) PreE w/ SF: s/p MgSO4, on Procardia 60XL qAM beginning POD#2, was increased from 30Xl    *Bps since 140s/90s, asymptomatic    *Plan to DC on meds and f/u in 1wk for BP check  Plan for discharge today. Will contact peds regarding circumcision status for baby boy. Planning on obtaining breast pump from [facility].    LOS: 3 days

## 2020-10-25 ENCOUNTER — Telehealth: Payer: Self-pay | Admitting: General Practice

## 2020-10-25 NOTE — Telephone Encounter (Signed)
Transition Care Management Unsuccessful Follow-up Telephone Call  Date of discharge and from where:  [facility]) 10/23/20  Attempts:  1st Attempt  Reason for unsuccessful TCM follow-up call:  Left voice message

## 2020-10-26 NOTE — Telephone Encounter (Signed)
Transition Care Management Unsuccessful Follow-up Telephone Call  Date of discharge and from where:  10/23/2020 from [facility]  Attempts:  2nd Attempt  Reason for unsuccessful TCM follow-up call:  Left voice message

## 2020-10-27 NOTE — Telephone Encounter (Signed)
Transition Care Management Follow-up Telephone Call  Date of discharge and from where: 10/23/2020 from [facility].   How have you been since you were released from the hospital? Pt states that she is feeling well and has no questions or concerns at this time.   Any questions or concerns? No  Items Reviewed:  Did the pt receive and understand the discharge instructions provided? Yes   Medications obtained and verified? Yes   Other? No   Any new allergies since your discharge? No   Dietary orders reviewed? n/a  Do you have support at home? Yes   Functional Questionnaire: (I = Independent and D = Dependent) ADLs: I  Bathing/Dressing- I  Meal Prep- I  Eating- I  Maintaining continence- I  Transferring/Ambulation- I  Managing Meds- I  Follow up appointments reviewed:   PCP Hospital f/u appt confirmed? No    Specialist Hospital f/u appt confirmed? Yes  Scheduled to see Nehemiah Settle, FNP on .11/02/2020 @ 09:30am.  Are transportation arrangements needed? No   If their condition worsens, is the pt aware to call PCP or go to the Emergency Dept.? Yes  Was the patient provided with contact information for the PCP's office or ED? Yes  Was to pt encouraged to call back with questions or concerns? Yes

## 2020-11-01 NOTE — Patient Instructions (Incomplete)
Mild persistent asthma Continue Pulmicort Flexihaler 180 mcg 2 puffs twice a day to help prevent cough and wheeze. Make sure and use this every day. Either set an alarm on your phone twice a day to remember to use Pulmicort or set your inhaler next to your toothbrush and use before you brush your teeth May use albuterol 2 puffs every 4 hours as needed for cough, wheeze, tightness in chest, or shortness of breath.  Seasonal and perennial allergic rhinitis Continue Claritin 10 mg as per OBGYN once a day as needed for runny nose.  Contiue Rhinocort 1-2 sprays each nostril once a day as needed for stuffy   Gastroesophageal reflux disease Continue dietary and lifestyle modifications  Food allergy  Continue to avoid banana. Continue to carry epinephrine auto-injector with you at all times   Please let us know if this treatment plan is not working well for you Schedule a follow-up appointment in months or sooner if needed

## 2020-11-02 ENCOUNTER — Ambulatory Visit: Payer: Medicaid Other | Admitting: Family

## 2020-11-11 NOTE — Patient Instructions (Addendum)
Mild persistent asthma Continue Pulmicort Flexihaler 180 mcg 2 puffs twice a day to help prevent cough and wheeze.  May use albuterol 2 puffs every 4 hours as needed for cough, wheeze, tightness in chest, or shortness of breath.  Seasonal and perennial allergic rhinitis Continue Claritin 10 mg as per OBGYN once a day as needed for runny nose.  Contiue Rhinocort 1-2 sprays each nostril once a day as needed for stuffy   Gastroesophageal reflux disease Continue dietary and lifestyle modifications  Food allergy  Continue to avoid banana. Continue to carry epinephrine auto-injector with you at all times   Please let us know if this treatment plan is not working well for you Schedule a follow-up appointment in 4 months or sooner if needed

## 2020-11-12 ENCOUNTER — Encounter: Payer: Self-pay | Admitting: Family

## 2020-11-12 ENCOUNTER — Ambulatory Visit (INDEPENDENT_AMBULATORY_CARE_PROVIDER_SITE_OTHER): Payer: Medicaid Other | Admitting: Family

## 2020-11-12 ENCOUNTER — Other Ambulatory Visit: Payer: Self-pay

## 2020-11-12 VITALS — BP 114/68 | HR 82 | Temp 98.0°F | Resp 12

## 2020-11-12 DIAGNOSIS — J4530 Mild persistent asthma, uncomplicated: Secondary | ICD-10-CM

## 2020-11-12 DIAGNOSIS — T7800XD Anaphylactic reaction due to unspecified food, subsequent encounter: Secondary | ICD-10-CM

## 2020-11-12 DIAGNOSIS — K219 Gastro-esophageal reflux disease without esophagitis: Secondary | ICD-10-CM

## 2020-11-12 DIAGNOSIS — J3089 Other allergic rhinitis: Secondary | ICD-10-CM | POA: Diagnosis not present

## 2020-11-12 NOTE — Progress Notes (Addendum)
[address] [place] [address] Dept: [phone]  FOLLOW UP NOTE  Patient ID: Tina [name], female    DOB: 03-26-1986  Age: 35 y.o. MRN: [id number] Date of Office Visit: 11/12/2020  Assessment  Chief Complaint: Asthma  HPI Tina [name] is a 35 year old female who presents today for follow-up of mild persistent asthma, seasonal and perennial allergic rhinitis, anaphylactic shock due to food, gastroesophageal reflux disease, and [redacted] weeks gestation of pregnancy.  Since her last office visit she reports that she delivered a healthy baby boy on October 20, 2020 via emergency C-section.  She is currently breast-feeding him..  She mentions she was given cefazolin preventively during the C-section and her face began to itch and feel hot.  Her face and lips then began to swell.  She does not have any knowledge of them giving her any medication.  She denies any concomitant cardiorespiratory and gastrointestinal symptoms.  Mild persistent asthma is reported as controlled with Pulmicort Flexhaler 180 mcg 2 puffs twice a day and albuterol as needed.  She denies any coughing, wheezing, tightness in her chest, shortness of breath, and nocturnal awakenings.  She did mention that while she was in the hospital delivering her son she did have wheezing and was given breathing treatments once a day by day respiratory therapist.  She has not had any problems with her breathing since coming home from the hospital.  Seasonal and perennial allergic rhinitis is reported as controlled with Claritin 10 mg once a day and Rhinocort as needed.  She denies any rhinorrhea, nasal congestion, and postnasal drip.  She has not had any sinus infections since we last saw her.  Gastroesophageal reflux disease is reported as controlled with no medications at this time.  She continues to avoid banana without any accidental ingestion or use of her epinephrine autoinjector device.   Drug Allergies:  Allergies   Allergen Reactions   Banana Swelling    Throat swelling   Cefazolin Swelling    Facial edema (no airway edema) during c-section after ancef. Has not been formally tested for cephalosporin allergy.    Latex Hives and Swelling    Review of Systems: Review of Systems  Constitutional: Negative for chills and fever.  HENT:       Denies rhinorrhea, nasal congestion, postnasal drip  Eyes:       Denies itchy watery eyes  Gastrointestinal: Negative for heartburn.  Genitourinary: Positive for dysuria.       Reports tenderness when she urinates.  She reports that she spoke with her OB/GYN about this last week told that this could be due to bruising from the delivery of the baby  Skin: Negative for itching and rash.  Neurological: Negative for headaches.  Endo/Heme/Allergies: Positive for environmental allergies.    Physical Exam: BP 114/68   Pulse 82   Temp 98 F (36.7 C) (Temporal)   Resp 12   SpO2 99%    Physical Exam Constitutional:      Appearance: Normal appearance.  HENT:     Head: Normocephalic and atraumatic.     Comments: Pharynx normal, eyes normal, ears normal, nose :bilateral lower turbinates moderately edematous and slightly erythematous with clear drainage noted    Right Ear: Tympanic membrane, ear canal and external ear normal.     Left Ear: Tympanic membrane, ear canal and external ear normal.     Mouth/Throat:     Mouth: Mucous membranes are moist.     Pharynx: Oropharynx is clear.  Eyes:     Conjunctiva/sclera: Conjunctivae normal.  Cardiovascular:     Rate and Rhythm: Regular rhythm.     Heart sounds: Normal heart sounds.  Pulmonary:     Effort: Pulmonary effort is normal.     Breath sounds: Normal breath sounds.     Comments: Lungs clear to auscultation Musculoskeletal:     Cervical back: Neck supple.  Skin:    General: Skin is warm.  Neurological:     Mental Status: She is alert and oriented to person, place, and time.  Psychiatric:        Mood  and Affect: Mood normal.        Behavior: Behavior normal.        Thought Content: Thought content normal.        Judgment: Judgment normal.     Diagnostics: FVC 2.11 L, FEV1 1.72 L.  Predicted FVC 2.79 L, FEV1 2.37 L.  Spirometry indicates possible mild restriction.  Post bronco dilator response shows FVC 2.18 L, FEV1 1.83 L.  There is a 6% change in FEV1.  Spirometry still indicates possible mild restriction  Assessment and Plan: 1. Mild persistent asthma without complication   2. Seasonal and perennial allergic rhinitis   3. Anaphylactic shock due to food, subsequent encounter   4. Gastroesophageal reflux disease, unspecified whether esophagitis present     No orders of the defined types were placed in this encounter.   Patient Instructions  Mild persistent asthma Continue Pulmicort Flexihaler 180 mcg 2 puffs twice a day to help prevent cough and wheeze.  May use albuterol 2 puffs every 4 hours as needed for cough, wheeze, tightness in chest, or shortness of breath.  Seasonal and perennial allergic rhinitis Continue Claritin 10 mg as per OBGYN once a day as needed for runny nose.  Contiue Rhinocort 1-2 sprays each nostril once a day as needed for stuffy   Gastroesophageal reflux disease Continue dietary and lifestyle modifications  Food allergy  Continue to avoid banana. Continue to carry epinephrine auto-injector with you at all times   Please let us know if this treatment plan is not working well for you Schedule a follow-up appointment in 4 months or sooner if needed   Return in about 4 months (around 03/15/2021), or if symptoms worsen or fail to improve.    Thank you for the opportunity to care for this patient.  Please do not hesitate to contact me with questions.  Tina Settle, FNP [facility]  I have provided oversight concerning Tina [name]' evaluation and treatment of this patient's health issues addressed during today's  encounter. I agree with the assessment and therapeutic plan as outlined in the note.   Signed,   Jessica Priest, MD,  Allergy and Immunology,  [facility].

## 2020-12-21 ENCOUNTER — Encounter (HOSPITAL_BASED_OUTPATIENT_CLINIC_OR_DEPARTMENT_OTHER): Payer: Self-pay | Admitting: *Deleted

## 2020-12-21 ENCOUNTER — Other Ambulatory Visit: Payer: Self-pay

## 2020-12-21 ENCOUNTER — Emergency Department (HOSPITAL_BASED_OUTPATIENT_CLINIC_OR_DEPARTMENT_OTHER): Payer: Medicaid Other

## 2020-12-21 ENCOUNTER — Emergency Department (HOSPITAL_BASED_OUTPATIENT_CLINIC_OR_DEPARTMENT_OTHER)
Admission: EM | Admit: 2020-12-21 | Discharge: 2020-12-21 | Disposition: A | Discharge status: Home / Self Care | Payer: Medicaid Other | Attending: Emergency Medicine | Admitting: Emergency Medicine

## 2020-12-21 DIAGNOSIS — Z87891 Personal history of nicotine dependence: Secondary | ICD-10-CM | POA: Diagnosis not present

## 2020-12-21 DIAGNOSIS — R1084 Generalized abdominal pain: Secondary | ICD-10-CM | POA: Insufficient documentation

## 2020-12-21 DIAGNOSIS — R110 Nausea: Secondary | ICD-10-CM | POA: Diagnosis not present

## 2020-12-21 DIAGNOSIS — J45909 Unspecified asthma, uncomplicated: Secondary | ICD-10-CM | POA: Diagnosis not present

## 2020-12-21 DIAGNOSIS — Z91040 Latex allergy status: Secondary | ICD-10-CM | POA: Diagnosis not present

## 2020-12-21 DIAGNOSIS — K219 Gastro-esophageal reflux disease without esophagitis: Secondary | ICD-10-CM | POA: Diagnosis not present

## 2020-12-21 LAB — COMPREHENSIVE METABOLIC PANEL
ALT: 21 U/L (ref 0–44)
AST: 22 U/L (ref 15–41)
Albumin: 4 g/dL (ref 3.5–5.0)
Alkaline Phosphatase: 57 U/L (ref 38–126)
Anion gap: 9 (ref 5–15)
BUN: 9 mg/dL (ref 6–20)
CO2: 26 mmol/L (ref 22–32)
Calcium: 8.9 mg/dL (ref 8.9–10.3)
Chloride: 103 mmol/L (ref 98–111)
Creatinine, Ser: 0.68 mg/dL (ref 0.44–1.00)
GFR, Estimated: >60 mL/min (ref >60)
Glucose, Bld: 78 mg/dL (ref 70–99)
Potassium: 3.2 mmol/L — ABNORMAL LOW (ref 3.5–5.1)
Sodium: 138 mmol/L (ref 135–145)
Total Bilirubin: 0.4 mg/dL (ref 0.3–1.2)
Total Protein: 7 g/dL (ref 6.5–8.1)

## 2020-12-21 LAB — CBC WITH DIFFERENTIAL/PLATELET
Abs Immature Granulocytes: 0.01 10*3/uL (ref 0.00–0.07)
Basophils Absolute: 0 10*3/uL (ref 0.0–0.1)
Basophils Relative: 0 %
Eosinophils Absolute: 0.3 10*3/uL (ref 0.0–0.5)
Eosinophils Relative: 6 %
HCT: 36.2 % (ref 36.0–46.0)
Hemoglobin: 12 g/dL (ref 12.0–15.0)
Immature Granulocytes: 0 %
Lymphocytes Relative: 32 %
Lymphs Abs: 1.6 10*3/uL (ref 0.7–4.0)
MCH: 30.2 pg (ref 26.0–34.0)
MCHC: 33.1 g/dL (ref 30.0–36.0)
MCV: 91 fL (ref 80.0–100.0)
Monocytes Absolute: 0.5 10*3/uL (ref 0.1–1.0)
Monocytes Relative: 9 %
Neutro Abs: 2.7 10*3/uL (ref 1.7–7.7)
Neutrophils Relative %: 53 %
Platelets: 236 10*3/uL (ref 150–400)
RBC: 3.98 MIL/uL (ref 3.87–5.11)
RDW: 13 % (ref 11.5–15.5)
WBC: 5.1 10*3/uL (ref 4.0–10.5)
nRBC: 0 % (ref 0.0–0.2)

## 2020-12-21 LAB — URINALYSIS, ROUTINE W REFLEX MICROSCOPIC
Glucose, UA: NEGATIVE mg/dL
Hgb urine dipstick: NEGATIVE
Ketones, ur: NEGATIVE mg/dL
Leukocytes,Ua: NEGATIVE
Nitrite: NEGATIVE
Protein, ur: NEGATIVE mg/dL
Specific Gravity, Urine: 1.025 (ref 1.005–1.030)
pH: 6.5 (ref 5.0–8.0)

## 2020-12-21 LAB — PREGNANCY, URINE: Preg Test, Ur: NEGATIVE

## 2020-12-21 LAB — LIPASE, BLOOD: Lipase: 33 U/L (ref 11–51)

## 2020-12-21 MED ORDER — SODIUM CHLORIDE 0.9 % IV BOLUS
1000.0000 mL | Freq: Once | INTRAVENOUS | Status: AC
  Administered 2020-12-21: 1000 mL via INTRAVENOUS

## 2020-12-21 MED ORDER — ONDANSETRON HCL 4 MG/2ML IJ SOLN
4.0000 mg | Freq: Once | INTRAMUSCULAR | Status: AC
  Administered 2020-12-21: 4 mg via INTRAVENOUS
  Filled 2020-12-21: qty 2

## 2020-12-21 NOTE — ED Triage Notes (Signed)
8 weeks post partum. Here with abdominal cramping that feels like a gas pain.

## 2020-12-21 NOTE — ED Provider Notes (Signed)
[facility] EMERGENCY DEPARTMENT Provider Note   CSN: [id number] Arrival date & time: 12/21/20  1137     History Chief Complaint  Patient presents with   Abdominal Pain    Tina [name] is a 35 y.o. female.  The history is provided by the patient.  Abdominal Pain Pain location:  Generalized Pain quality: aching   Pain radiates to:  Does not radiate Pain severity:  Mild Onset quality:  Gradual Duration:  3 days Timing:  Intermittent Progression:  Waxing and waning Chronicity:  New Context comment:  Recent c section 8  weeks ago, periumbilical abdominal pain for the last three days. Some nauses. Relieved by:  Nothing Worsened by:  Nothing Associated symptoms: nausea   Associated symptoms: no chest pain, no chills, no constipation, no cough, no diarrhea, no dysuria, no fever, no hematuria, no shortness of breath, no sore throat, no vaginal bleeding, no vaginal discharge and no vomiting       Past Medical History:  Diagnosis Date   Allergy    Asthma    Heart murmur     Patient Active Problem List   Diagnosis Date Noted   Pregnancy 10/20/2020   CTS (carpal tunnel syndrome) 08/25/2020   Cardiac murmur 04/28/2020   Subcutaneous mass 12/23/2019   Dizziness 09/04/2019   Contact with and (suspected) exposure to covid-19 07/24/2019   GERD (gastroesophageal reflux disease) 07/23/2019   Asthma exacerbation 02/19/2019   Well adult exam 02/04/2019   Seasonal and perennial allergic rhinitis 01/22/2019   Food allergy 01/22/2019   Mild persistent asthma 01/14/2019    Past Surgical History:  Procedure Laterality Date   BREAST EXCISIONAL BIOPSY Left 2007   CESAREAN SECTION N/A 10/20/2020   Procedure: CESAREAN SECTION;  Surgeon: Lavina Hamman, MD;  Location: [facility] LD ORS;  Service: Obstetrics;  Laterality: N/A;  FHR Decelerations   COLPOSCOPY     LAPAROSCOPY     WISDOM TOOTH EXTRACTION       OB History     Gravida  1   Para  1   Term  1   Preterm   0   AB  0   Living  1      SAB  0   IAB  0   Ectopic  0   Multiple  0   Live Births  1           Family History  Problem Relation Age of Onset   Diabetes Mother    Hypertension Father    Asthma Father    Diabetes Father    Cancer Maternal Aunt        colon   Lupus Maternal Aunt    Diabetes Maternal Aunt    Breast cancer Maternal Grandmother    Breast cancer Paternal Grandmother    Diabetes Paternal Grandmother    Hypertension Maternal Aunt    Diabetes Maternal Aunt    Diabetes Maternal Aunt    Allergic rhinitis Sister    Food Allergy Sister    Eczema Sister    Asthma Maternal Uncle    Diabetes Paternal Grandfather     Social History   Tobacco Use   Smoking status: Former    Pack years: 0.00    Types: E-cigarettes   Smokeless tobacco: Never  Vaping Use   Vaping Use: Never used  Substance Use Topics   Alcohol use: Not Currently    Comment: occ   Drug use: No    Home Medications Prior to Admission  medications   Medication Sig Start Date End Date Taking? Authorizing Provider  albuterol (VENTOLIN HFA) 108 (90 Base) MCG/ACT inhaler 2 puffs every 4-6 hours as needed for coughing or wheezing spells 06/15/20   Nehemiah Settle, FNP  budesonide (PULMICORT FLEXHALER) 180 MCG/ACT inhaler Inhale 2 puffs into the lungs 2 (two) times daily. Rinse, gargle and spit out after use. 06/17/20   Nehemiah Settle, FNP  budesonide (RHINOCORT AQUA) 32 MCG/ACT nasal spray Use 2 sprays in each nostril once a day as needed for stuffy nose. 06/17/20   Nehemiah Settle, FNP  EPINEPHrine (EPIPEN 2-PAK) 0.3 mg/0.3 mL IJ SOAJ injection Use as directed for severe allergic reactions Patient taking differently: Inject 0.3 mg into the muscle as needed for anaphylaxis. Use as directed for severe allergic reactions 06/22/20   Nehemiah Settle, FNP  Prenatal Vit-Fe Fumarate-FA (PRENATAL MULTIVITAMIN) TABS tablet Take 1 tablet by mouth daily at 12 noon.    [provider]     Allergies    Banana, Cefazolin, and Latex  Review of Systems   Review of Systems  Constitutional:  Negative for chills and fever.  HENT:  Negative for ear pain and sore throat.   Eyes:  Negative for pain and visual disturbance.  Respiratory:  Negative for cough and shortness of breath.   Cardiovascular:  Negative for chest pain and palpitations.  Gastrointestinal:  Positive for abdominal pain and nausea. Negative for abdominal distention, anal bleeding, blood in stool, constipation, diarrhea, rectal pain and vomiting.  Genitourinary:  Negative for decreased urine volume, difficulty urinating, dysuria, flank pain, frequency, hematuria, menstrual problem, pelvic pain, urgency, vaginal bleeding, vaginal discharge and vaginal pain.  Musculoskeletal:  Negative for arthralgias and back pain.  Skin:  Negative for color change and rash.  Neurological:  Negative for seizures and syncope.  All other systems reviewed and are negative.  Physical Exam Updated Vital Signs BP 132/77 (BP Location: Right Arm)   Pulse (!) 58   Temp 97.9 F (36.6 C)   Resp 18   Ht 4\' 11"  (1.499 m)   Wt 54.9 kg   SpO2 100%   Breastfeeding Yes   BMI 24.44 kg/m   Physical Exam Vitals and nursing note reviewed.  Constitutional:      General: She is not in acute distress.    Appearance: She is well-developed. She is not ill-appearing.  HENT:     Head: Normocephalic and atraumatic.     Mouth/Throat:     Mouth: Mucous membranes are moist.  Eyes:     Extraocular Movements: Extraocular movements intact.     Conjunctiva/sclera: Conjunctivae normal.  Cardiovascular:     Rate and Rhythm: Normal rate and regular rhythm.     Heart sounds: Normal heart sounds. No murmur heard. Pulmonary:     Effort: Pulmonary effort is normal. No respiratory distress.     Breath sounds: Normal breath sounds.  Abdominal:     General: There is no distension.     Palpations: Abdomen is soft.     Tenderness: There is generalized  abdominal tenderness.  Musculoskeletal:     Cervical back: Neck supple.  Skin:    General: Skin is warm and dry.     Capillary Refill: Capillary refill takes less than 2 seconds.  Neurological:     General: No focal deficit present.     Mental Status: She is alert.  Psychiatric:        Mood and Affect: Mood normal.    ED Results / Procedures /  Treatments   Labs (all labs ordered are listed, but only abnormal results are displayed) Labs Reviewed  URINALYSIS, ROUTINE W REFLEX MICROSCOPIC - Abnormal; Notable for the following components:      Result Value   APPearance CLOUDY (*)    Bilirubin Urine SMALL (*)    All other components within normal limits  COMPREHENSIVE METABOLIC PANEL - Abnormal; Notable for the following components:   Potassium 3.2 (*)    All other components within normal limits  PREGNANCY, URINE  CBC WITH DIFFERENTIAL/PLATELET  LIPASE, BLOOD    EKG None  Radiology CT ABDOMEN PELVIS WO CONTRAST  Result Date: 12/21/2020 CLINICAL DATA:  Abdominal cramping. Patient is 8 weeks postpartum, delivery by Caesarean section. EXAM: CT ABDOMEN AND PELVIS WITHOUT CONTRAST TECHNIQUE: Multidetector CT imaging of the abdomen and pelvis was performed following the standard protocol without IV contrast. COMPARISON:  None. FINDINGS: Lower chest: Included lung bases are clear.  Heart size is normal. Hepatobiliary: Unremarkable unenhanced appearance of the liver. No focal liver lesion identified. Gallbladder within normal limits. No hyperdense gallstone. No biliary dilatation. Pancreas: Unremarkable. No pancreatic ductal dilatation or surrounding inflammatory changes. Spleen: Normal in size without focal abnormality. Adrenals/Urinary Tract: Adrenal glands are unremarkable. Kidneys are normal, without renal calculi, focal lesion, or hydronephrosis. Bladder is decompressed. Stomach/Bowel: Stomach is within normal limits. Appendix appears normal (series 2, image 52). No evidence of bowel  wall thickening, distention, or inflammatory changes. Vascular/Lymphatic: No significant vascular findings are evident on unenhanced exam. No enlarged abdominal or pelvic lymph nodes. Reproductive: Retroverted uterus.  No adnexal masses. Other: Small volume simple fluid density free fluid within the cul-de-sac. No organized abdominopelvic fluid collection. No abdominal wall hernia. A low anterior abdominal wall surgical incision site is evident related to recent cesarean section. No postsurgical fluid collection. Musculoskeletal: No acute or significant osseous findings. IMPRESSION: 1. Small volume simple fluid density free fluid within the cul-de-sac, likely physiologic. 2. Otherwise, no acute abdominopelvic findings. Normal appendix. Electronically Signed   By: Duanne Guess D.O.   On: 12/21/2020 13:11    Procedures Procedures   Medications Ordered in ED Medications  sodium chloride 0.9 % bolus 1,000 mL ( Intravenous Stopped 12/21/20 1317)  ondansetron (ZOFRAN) injection 4 mg (4 mg Intravenous Given 12/21/20 1211)    ED Course  I have reviewed the triage vital signs and the nursing notes.  Pertinent labs & imaging results that were available during my care of the patient were reviewed by me and considered in my medical decision making (see chart for details).    MDM Rules/Calculators/A&P                          Tina [name] is here with abdominal pain.  Recent C-section 8 weeks ago.  Has felt fine until 3 days ago when she has had some lower abdominal pain.  History of irritable bowel syndrome as well.  No major constipation or diarrhea.  Has felt nauseous.  No real focal abdominal tenderness on exam and feels mildly tender throughout.  We will get basic labs including CT scan to evaluate for bowel obstruction versus postop complication, seems less likely to be appendicitis.  Could be UTI.  CT scan is normal.  No appendicitis, no bowel obstruction.  No urinary tract infection.  No  significant anemia, electrolyte abnormality, kidney injury.  Gallbladder and liver within normal limits and doubt cholecystitis.  Overall suspect musculoskeletal type pain or gas pain.  Discharged  from the ED in good condition.  This chart was dictated using voice recognition software.  Despite best efforts to proofread,  errors can occur which ca n change the documentation meaning.   Final Clinical Impression(s) / ED Diagnoses Final diagnoses:  Generalized abdominal pain    Rx / DC Orders ED Discharge Orders     None        Virgina NorfolkCuratolo, Braedon Sjogren, DO 12/21/20 1327

## 2020-12-22 ENCOUNTER — Telehealth: Payer: Self-pay | Admitting: General Practice

## 2020-12-22 NOTE — Telephone Encounter (Signed)
Transition Care Management Unsuccessful Follow-up Telephone Call  Date of discharge and from where:  [facility] 12/21/20  Attempts:  1st Attempt  Reason for unsuccessful TCM follow-up call:  Left voice message

## 2020-12-23 NOTE — Telephone Encounter (Signed)
Transition Care Management Follow-up Telephone Call Date of discharge and from where: 12/21/2020 [facility] How have you been since you were released from the hospital? Pt stated that the abdominal has improved and does not have any questions or concerns at this time.  Any questions or concerns? No  Items Reviewed: Did the pt receive and understand the discharge instructions provided? Yes  Medications obtained and verified? Yes  Other? No  Any new allergies since your discharge? No  Dietary orders reviewed? No Do you have support at home? Yes   Functional Questionnaire: (I = Independent and D = Dependent) ADLs: I  Bathing/Dressing- I  Meal Prep- I  Eating- I  Maintaining continence- I  Transferring/Ambulation- I  Managing Meds- I   Follow up appointments reviewed:  PCP Hospital f/u appt confirmed? No   Specialist Hospital f/u appt confirmed? No   Are transportation arrangements needed? No  If their condition worsens, is the pt aware to call PCP or go to the Emergency Dept.? Yes Was the patient provided with contact information for the PCP's office or ED? Yes Was to pt encouraged to call back with questions or concerns? Yes

## 2021-02-08 ENCOUNTER — Ambulatory Visit: Payer: Medicaid Other | Admitting: Sports Medicine

## 2021-02-15 ENCOUNTER — Ambulatory Visit (INDEPENDENT_AMBULATORY_CARE_PROVIDER_SITE_OTHER): Payer: Medicaid Other

## 2021-02-15 ENCOUNTER — Ambulatory Visit (INDEPENDENT_AMBULATORY_CARE_PROVIDER_SITE_OTHER): Payer: Medicaid Other | Admitting: Sports Medicine

## 2021-02-15 DIAGNOSIS — G5601 Carpal tunnel syndrome, right upper limb: Secondary | ICD-10-CM | POA: Diagnosis not present

## 2021-02-15 NOTE — Assessment & Plan Note (Signed)
This is a very pleasant 35 year old female, she is now about 4 months postpartum, recurring numbness and tingling in the right hand, we did a Hydro dissection back in February she had excellent improvement. We will do the procedure again today, she understands to look out for breast milk production decreases, she will pump and freeze as much as possible. If she does have any decrease in breast milk production and is unable to keep up with her stores we will likely start metoclopramide to increase breastmilk production. Return to see me in 4 to 6 weeks, continue night splinting.

## 2021-02-15 NOTE — Progress Notes (Signed)
    Procedures performed today:    Procedure: Real-time Ultrasound Guided hydrodissection of the right median nerve at the carpal tunnel Device: Samsung HS60 Verbal informed consent obtained.  Time-out conducted.  Noted no overlying erythema, induration, or other signs of local infection.  Skin prepped in a sterile fashion.  Local anesthesia: Topical Ethyl chloride.  With sterile technique and under real time ultrasound guidance: Noted enlarged median nerve, using a 25-gauge needle advanced into the carpal tunnel, taking care to avoid intraneural injection I injected medication both superficial to and deep to the median nerve freeing it from surrounding structures, I then redirected the needle deep and injected further medication around the flexor tendons deep within the carpal tunnel for a total of 1 cc kenalog 40, 5 cc 1% lidocaine without epinephrine. Completed without difficulty  Advised to call if fevers/chills, erythema, induration, drainage, or persistent bleeding.  Images permanently stored and available for review in PACS.  Impression: Technically successful ultrasound guided median nerve hydrodissection.  Independent interpretation of notes and tests performed by another provider:   None.  Brief History, Exam, Impression, and Recommendations:    CTS (carpal tunnel syndrome) This is a very pleasant 35 year old female, she is now about 4 months postpartum, recurring numbness and tingling in the right hand, we did a Hydro dissection back in February she had excellent improvement. We will do the procedure again today, she understands to look out for breast milk production decreases, she will pump and freeze as much as possible. If she does have any decrease in breast milk production and is unable to keep up with her stores we will likely start metoclopramide to increase breastmilk production. Return to see me in 4 to 6 weeks, continue night  splinting.    ___________________________________________ Ihor Austin. Benjamin Stain, M.D., ABFM., CAQSM. Primary Care and Sports Medicine [facility]  Adjunct Instructor of Family Medicine  [employer]

## 2021-03-29 ENCOUNTER — Ambulatory Visit (INDEPENDENT_AMBULATORY_CARE_PROVIDER_SITE_OTHER): Payer: Medicaid Other | Admitting: Sports Medicine

## 2021-03-29 ENCOUNTER — Encounter: Payer: Self-pay | Admitting: Family Medicine

## 2021-03-29 ENCOUNTER — Other Ambulatory Visit: Payer: Self-pay

## 2021-03-29 ENCOUNTER — Ambulatory Visit (INDEPENDENT_AMBULATORY_CARE_PROVIDER_SITE_OTHER): Payer: Medicaid Other | Admitting: Family Medicine

## 2021-03-29 VITALS — BP 119/70 | HR 75 | Temp 98.1°F | Resp 17 | Wt 108.3 lb

## 2021-03-29 DIAGNOSIS — G5601 Carpal tunnel syndrome, right upper limb: Secondary | ICD-10-CM

## 2021-03-29 DIAGNOSIS — Z111 Encounter for screening for respiratory tuberculosis: Secondary | ICD-10-CM

## 2021-03-29 DIAGNOSIS — Z0000 Encounter for general adult medical examination without abnormal findings: Secondary | ICD-10-CM | POA: Diagnosis not present

## 2021-03-29 NOTE — Addendum Note (Signed)
Addended by: Gaynelle Arabian on: 03/29/2021 01:16 PM   Modules accepted: Orders

## 2021-03-29 NOTE — Addendum Note (Signed)
Addended by: Gaynelle Arabian on: 03/29/2021 12:19 PM   Modules accepted: Orders

## 2021-03-29 NOTE — Progress Notes (Signed)
    Procedures performed today:    None.  Independent interpretation of notes and tests performed by another provider:   None.  Brief History, Exam, Impression, and Recommendations:    CTS (carpal tunnel syndrome) This pleasant 35 year old female returns, she has had 2 Hydro dissections on the right, she is doing extremely well, no symptoms, we discussed some ergonomic modalities to reduce recurrence, return to see me as needed    ___________________________________________ Ihor Austin. Benjamin Stain, M.D., ABFM., CAQSM. Primary Care and Sports Medicine [facility]  Adjunct Instructor of Family Medicine  [employer]

## 2021-03-29 NOTE — Assessment & Plan Note (Signed)
This pleasant 35 year old female returns, she has had 2 Hydro dissections on the right, she is doing extremely well, no symptoms, we discussed some ergonomic modalities to reduce recurrence, return to see me as needed

## 2021-03-29 NOTE — Progress Notes (Signed)
BP 119/70   Pulse 75   Temp 98.1 F (36.7 C)   Resp 17   Wt 108 lb 4.8 oz (49.1 kg)   SpO2 99%   BMI 21.87 kg/m    Subjective:    Patient ID: Tina [name], female    DOB: 18-Jul-1985, 35 y.o.   MRN: [id number]  HPI: Tina [name] is a 35 y.o. female presenting on 03/29/2021 for comprehensive medical examination. Current medical complaints include:none  She currently lives with: boyfriend and baby Interim Problems from her last visit: no   She reports regular vision exams q1-5y: yes She reports regular dental exams q 37m: yes Her diet consists of: pescatarian She endorses exercise and/or activity of: walking 30-60 minutes a day She works at: stay at home, Runner, broadcasting/film/video   She denies ETOH use. - none recently, breastfeeding She denies nictoine use. She denies illegal substance use.    She reports periods have restarted since delivery - due to start 4 days ago Current menopausal symptoms: no She is currently  sexually active with  female  partner. She denies  concerns today about STI Contraception choices are: nothing; okay if she were to get pregnant again  She denies concerns about skin changes today. She denies concerns about bowel changes today. She denies concerns about bladder changes today.  Depression Screen done today and results listed below:  Depression screen [facility] 2/9 03/29/2021 12/23/2019 02/04/2019  Decreased Interest 1 0 0  Down, Depressed, Hopeless 1 1 0  PHQ - 2 Score 2 1 0  Altered sleeping 0 - -  Tired, decreased energy 1 1 -  Change in appetite 1 - -  Feeling bad or failure about yourself  1 1 -  Trouble concentrating 0 0 -  Moving slowly or fidgety/restless 0 0 -  Suicidal thoughts 0 0 -  PHQ-9 Score 5 - -  Difficult doing work/chores Somewhat difficult - -   GAD 7 : Generalized Anxiety Score 03/29/2021 12/23/2019  Nervous, Anxious, on Edge 1 0  Control/stop worrying 1 1  Worry too much - different things 2 1  Trouble relaxing 1 0   Restless 0 0  Easily annoyed or irritable 2 -  Afraid - awful might happen 1 1  Total GAD 7 Score 8 -  Anxiety Difficulty Somewhat difficult Not difficult at all    Traumatic delivery, reports mood is dramatically improving. Wants to go back to work. Not interested in counseling or medication. No suicidal/homicidal ideation  She does not have a history of falls.     Past Medical History:  Past Medical History:  Diagnosis Date   Allergy    Asthma    Heart murmur     Surgical History:  Past Surgical History:  Procedure Laterality Date   BREAST EXCISIONAL BIOPSY Left 2007   CESAREAN SECTION N/A 10/20/2020   Procedure: CESAREAN SECTION;  Surgeon: Lavina Hamman, MD;  Location: [facility] LD ORS;  Service: Obstetrics;  Laterality: N/A;  FHR Decelerations   COLPOSCOPY     LAPAROSCOPY     WISDOM TOOTH EXTRACTION      Medications:  Current Outpatient Medications on File Prior to Visit  Medication Sig   albuterol (VENTOLIN HFA) 108 (90 Base) MCG/ACT inhaler 2 puffs every 4-6 hours as needed for coughing or wheezing spells   budesonide (PULMICORT FLEXHALER) 180 MCG/ACT inhaler Inhale 2 puffs into the lungs 2 (two) times daily. Rinse, gargle and spit out after use.   budesonide (RHINOCORT AQUA) 32  MCG/ACT nasal spray Use 2 sprays in each nostril once a day as needed for stuffy nose.   EPINEPHrine (EPIPEN 2-PAK) 0.3 mg/0.3 mL IJ SOAJ injection Use as directed for severe allergic reactions (Patient taking differently: Inject 0.3 mg into the muscle as needed for anaphylaxis. Use as directed for severe allergic reactions)   Prenatal Vit-Fe Fumarate-FA (PRENATAL MULTIVITAMIN) TABS tablet Take 1 tablet by mouth daily at 12 noon.   No current facility-administered medications on file prior to visit.    Allergies:  Allergies  Allergen Reactions   Banana Swelling    Throat swelling   Cefazolin Swelling    Facial edema (no airway edema) during c-section after ancef. Has not been formally  tested for cephalosporin allergy.    Latex Hives and Swelling    Social History:  Social History   Socioeconomic History   Marital status: Single    Spouse name: Not on file   Number of children: Not on file   Years of education: Not on file   Highest education level: Not on file  Occupational History   Not on file  Tobacco Use   Smoking status: Former    Types: E-cigarettes   Smokeless tobacco: Never  Vaping Use   Vaping Use: Never used  Substance and Sexual Activity   Alcohol use: Not Currently    Comment: occ   Drug use: No   Sexual activity: Yes    Birth control/protection: Condom  Other Topics Concern   Not on file  Social History Narrative   Not on file   Social Determinants of Health   Financial Resource Strain: Not on file  Food Insecurity: Not on file  Transportation Needs: Not on file  Physical Activity: Not on file  Stress: Not on file  Social Connections: Not on file  Intimate Partner Violence: Not on file   Social History   Tobacco Use  Smoking Status Former   Types: E-cigarettes  Smokeless Tobacco Never   Social History   Substance and Sexual Activity  Alcohol Use Not Currently   Comment: occ    Family History:  Family History  Problem Relation Age of Onset   Diabetes Mother    Hypertension Father    Asthma Father    Diabetes Father    Cancer Maternal Aunt        colon   Lupus Maternal Aunt    Diabetes Maternal Aunt    Breast cancer Maternal Grandmother    Breast cancer Paternal Grandmother    Diabetes Paternal Grandmother    Hypertension Maternal Aunt    Diabetes Maternal Aunt    Diabetes Maternal Aunt    Allergic rhinitis Sister    Food Allergy Sister    Eczema Sister    Asthma Maternal Uncle    Diabetes Paternal Grandfather     Past medical history, surgical history, medications, allergies, family history and social history reviewed with patient today and changes made to appropriate areas of the chart.   All ROS  negative except what is listed above and in the HPI.      Objective:    BP 119/70   Pulse 75   Temp 98.1 F (36.7 C)   Resp 17   Wt 108 lb 4.8 oz (49.1 kg)   SpO2 99%   BMI 21.87 kg/m   Wt Readings from Last 3 Encounters:  03/29/21 108 lb 4.8 oz (49.1 kg)  12/21/20 121 lb (54.9 kg)  10/20/20 153 lb 3.2 oz (69.5 kg)  Physical Exam Vitals reviewed.  Constitutional:      Appearance: Normal appearance. She is normal weight.  HENT:     Right Ear: Tympanic membrane normal.     Left Ear: Tympanic membrane normal.     Nose: Nose normal.     Mouth/Throat:     Mouth: Mucous membranes are moist.     Pharynx: Oropharynx is clear.  Eyes:     Conjunctiva/sclera: Conjunctivae normal.     Pupils: Pupils are equal, round, and reactive to light.  Cardiovascular:     Rate and Rhythm: Normal rate and regular rhythm.     Pulses: Normal pulses.     Heart sounds: Normal heart sounds.  Pulmonary:     Effort: Pulmonary effort is normal.     Breath sounds: Normal breath sounds.  Abdominal:     General: Bowel sounds are normal. There is no distension.     Palpations: Abdomen is soft.     Tenderness: There is no abdominal tenderness. There is no guarding.  Genitourinary:    Comments: Deferred  Musculoskeletal:        General: Normal range of motion.     Cervical back: Normal range of motion and neck supple.  Skin:    General: Skin is warm and dry.  Neurological:     General: No focal deficit present.     Mental Status: She is alert and oriented to person, place, and time. Mental status is at baseline.  Psychiatric:        Mood and Affect: Mood normal.        Behavior: Behavior normal.        Thought Content: Thought content normal.        Judgment: Judgment normal.    Results for orders placed or performed during the hospital encounter of 12/21/20  Urinalysis, Routine w reflex microscopic Urine, Clean Catch  Result Value Ref Range   Color, Urine YELLOW YELLOW   APPearance  CLOUDY (A) CLEAR   Specific Gravity, Urine 1.025 1.005 - 1.030   pH 6.5 5.0 - 8.0   Glucose, UA NEGATIVE NEGATIVE mg/dL   Hgb urine dipstick NEGATIVE NEGATIVE   Bilirubin Urine SMALL (A) NEGATIVE   Ketones, ur NEGATIVE NEGATIVE mg/dL   Protein, ur NEGATIVE NEGATIVE mg/dL   Nitrite NEGATIVE NEGATIVE   Leukocytes,Ua NEGATIVE NEGATIVE  Pregnancy, urine  Result Value Ref Range   Preg Test, Ur NEGATIVE NEGATIVE  CBC with Differential  Result Value Ref Range   WBC 5.1 4.0 - 10.5 K/uL   RBC 3.98 3.87 - 5.11 MIL/uL   Hemoglobin 12.0 12.0 - 15.0 g/dL   HCT 41.3 24.4 - 01.0 %   MCV 91.0 80.0 - 100.0 fL   MCH 30.2 26.0 - 34.0 pg   MCHC 33.1 30.0 - 36.0 g/dL   RDW 27.2 53.6 - 64.4 %   Platelets 236 150 - 400 K/uL   nRBC 0.0 0.0 - 0.2 %   Neutrophils Relative % 53 %   Neutro Abs 2.7 1.7 - 7.7 K/uL   Lymphocytes Relative 32 %   Lymphs Abs 1.6 0.7 - 4.0 K/uL   Monocytes Relative 9 %   Monocytes Absolute 0.5 0.1 - 1.0 K/uL   Eosinophils Relative 6 %   Eosinophils Absolute 0.3 0.0 - 0.5 K/uL   Basophils Relative 0 %   Basophils Absolute 0.0 0.0 - 0.1 K/uL   Immature Granulocytes 0 %   Abs Immature Granulocytes 0.01 0.00 - 0.07 K/uL  Comprehensive  metabolic panel  Result Value Ref Range   Sodium 138 135 - 145 mmol/L   Potassium 3.2 (L) 3.5 - 5.1 mmol/L   Chloride 103 98 - 111 mmol/L   CO2 26 22 - 32 mmol/L   Glucose, Bld 78 70 - 99 mg/dL   BUN 9 6 - 20 mg/dL   Creatinine, Ser 2.35 0.44 - 1.00 mg/dL   Calcium 8.9 8.9 - 36.1 mg/dL   Total Protein 7.0 6.5 - 8.1 g/dL   Albumin 4.0 3.5 - 5.0 g/dL   AST 22 15 - 41 U/L   ALT 21 0 - 44 U/L   Alkaline Phosphatase 57 38 - 126 U/L   Total Bilirubin 0.4 0.3 - 1.2 mg/dL   GFR, Estimated >44 >31 mL/min   Anion gap 9 5 - 15  Lipase, blood  Result Value Ref Range   Lipase 33 11 - 51 U/L      Assessment & Plan:   Problem List Items Addressed This Visit   None Visit Diagnoses     Preventative health care    -  Primary           LABORATORY TESTING:  - Health maintenance labs deferred per patient  - STI testing: deferred - Pap smear: done elsewhere    IMMUNIZATIONS:   - Tdap: Tetanus vaccination status reviewed: pt looking for records. - Influenza: Refused - Pneumovax: Not applicable - Prevnar: Not applicable - HPV: Not applicable - Shingrix vaccine: Not applicable - COVID-19: Refused  SCREENING: - Mammogram: Not applicable - Bone Density: Not applicable - Colonoscopy: Not applicable  Discussed with patient purpose of the colonoscopy is to detect colon cancer at curable precancerous or early stages  - AAA Screening: Not applicable  -Hearing Test: Not applicable  -Spirometry: Not applicable  - Lung Cancer Screening: Not applicable    PATIENT COUNSELING:   Advised to take 1 mg of folate supplement per day if capable of pregnancy.   Sexuality: Discussed sexually transmitted diseases, partner selection, use of condoms, avoidance of unintended pregnancy, and contraceptive alternatives.   I discussed with the patient that most people either abstain from alcohol or drink within safe limits (<=14/week and <=4 drinks/occasion for males, <=7/weeks and <= 3 drinks/occasion for females) and that the risk for alcohol disorders and other health effects rises proportionally with the number of drinks per week and how often a drinker exceeds daily limits.  Discussed cessation/primary prevention of drug use and availability of treatment for abuse.   Diet: Encouraged to adjust caloric intake to maintain or achieve ideal body weight, to reduce intake of dietary saturated fat and total fat, to limit sodium intake by avoiding high sodium foods and not adding table salt, and to maintain adequate dietary potassium and calcium preferably from fresh fruits, vegetables, and low-fat dairy products. Encouraged vitamin D 1000 units and Calcium 1300mg  or 4 servings of dairy a day.  Emphasized the importance of regular  exercise.  Injury prevention: Discussed safety belts, safety helmets, smoke detector, smoking near bedding or upholstery.   Dental health: Discussed importance of regular tooth brushing, flossing, and dental visits.  Follow up plan:  Return in about 2 days (around 03/31/2021) for nurse visit: TB test read.  04/02/2021 Lollie Marrow, DNP, FNP-C

## 2021-03-31 ENCOUNTER — Ambulatory Visit: Payer: Medicaid Other

## 2021-05-12 ENCOUNTER — Ambulatory Visit: Payer: Self-pay | Admitting: *Deleted

## 2021-05-12 NOTE — Telephone Encounter (Signed)
Patient is presently breastfeeding after delivery in April. Patient states she has had 2 cycles then had 48 day break. Patient states she is sexually active without birth control. Patient advised although she is breastfeeding - she can still get pregnant- especially with cycles (irregular or regular in timing). Advised  UPT to rule out pregnancy, advised talk to provider about birth control that will not interfere with her breastfeeding or use condoms if pregnancy is not wanted at this time.

## 2021-05-12 NOTE — Telephone Encounter (Signed)
Pt called saying she is 6 month post pardom.  She said she has had two periods last one in august . She started bleeding today and does not know if she needs to be concerned or not...   CB#  [phone]  Reason for Disposition  Pregnancy suspected or possible  Answer Assessment - Initial Assessment Questions 1. LMP:  "When did your last menstrual period begin?"     02/20/21- not as heavy as normal cycle, started today 2. DAYS LATE: "How many days late is your menstrual period?"     48 days late 3. REGULARITY: "How regular are your periods?"     Before baby - very regular 4. PREGNANCY: "Is there any chance you are pregnant?" (e.g., unprotected intercourse, missed birth control pill, broken condom) "Have you used a home pregnancy test?"     Delivered 10/20/20 5. BREASTFEEDING: "Are you breastfeeding?"     Yes 6. BIRTH CONTROL PILLS: "Are you taking birth control pills, or have you stopped recently?"     No birth control 7. LONG-ACTING CONTRACEPTION: "Has your doctor given you a shot to prevent pregnancy?" (e.g., Depo-Provera injection) "Do you have an intrauterine device (IUD)".     no 8. CAUSE: "What do you think caused the missed period?" (e.g., stress, rapid weight loss, excessive exercise)     breastfeeding 9. OTHER SYMPTOMS: "Do you have any other symptoms?" (e.g., abdominal pain)     no  Protocols used: Menstrual Period - Missed or Late-A-AH

## 2021-06-24 IMAGING — CT CT ABD-PELV W/O CM
2 of 4 series · 16 of 46 positions shown, 18 images · non-contrast
Comparison: None.

CLINICAL DATA: Abdominal cramping. Patient is 8 weeks postpartum,
delivery by Caesarean section.

EXAM:
CT ABDOMEN AND PELVIS WITHOUT CONTRAST
TECHNIQUE: Multidetector CT imaging of the abdomen and pelvis was performed
following the standard protocol without IV contrast.

[Series 2: axial st · axial · 0.69mm/px · z∈[-426,-56]mm · 13 of 82 slices shown, 15 images]
[im 4/82  soft-tissue]
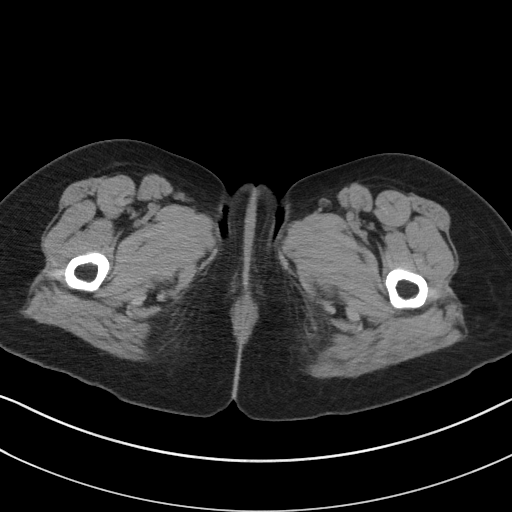
[im 4/82  bone]
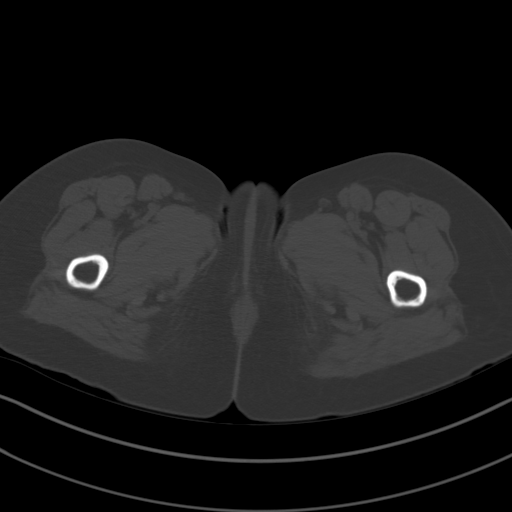
[im 11/82  soft-tissue]
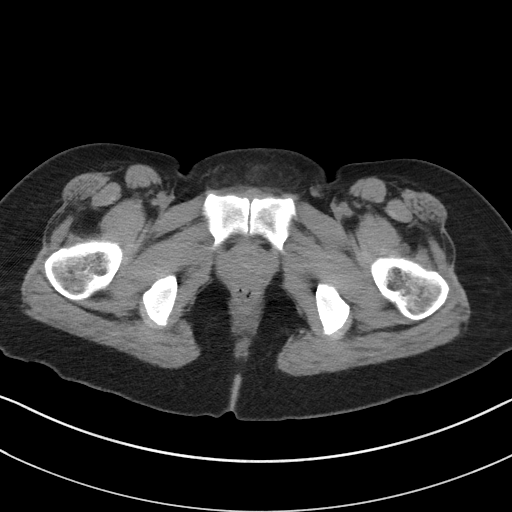
[im 17/82  soft-tissue]
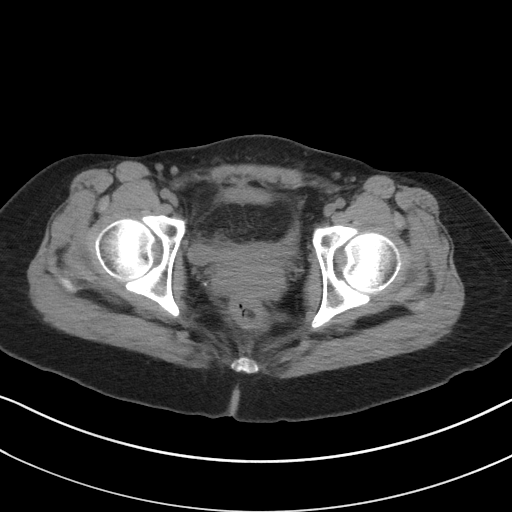
[im 24/82  soft-tissue]
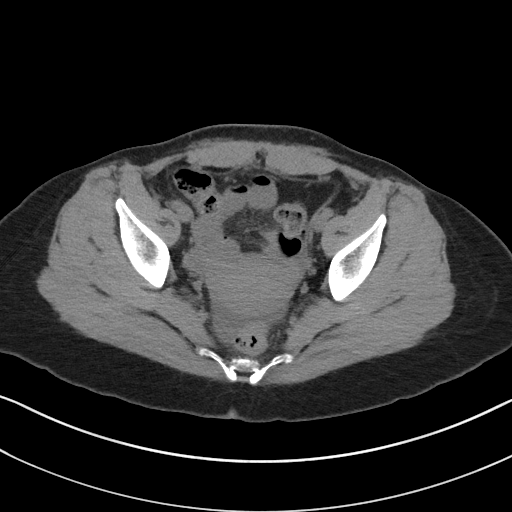
[im 28/82  soft-tissue]
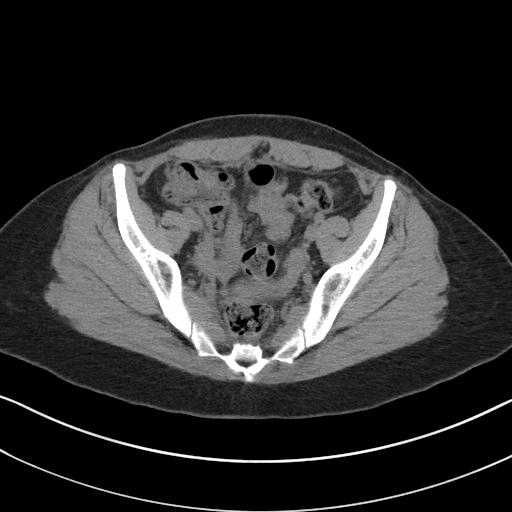
[im 34/82  soft-tissue]
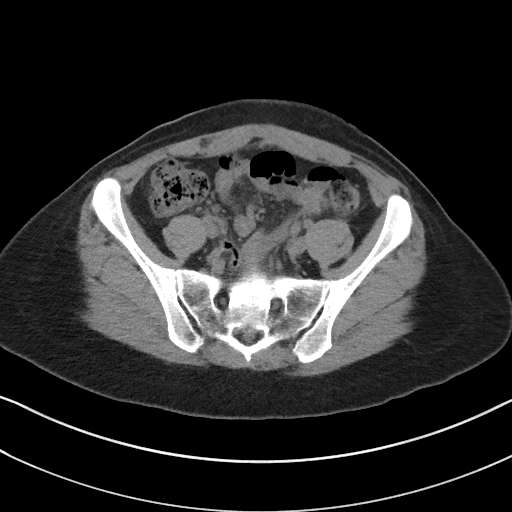
[im 41/82  soft-tissue]
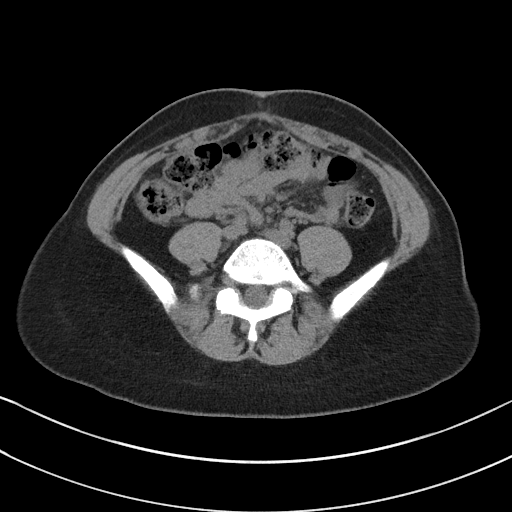
[im 48/82  soft-tissue]
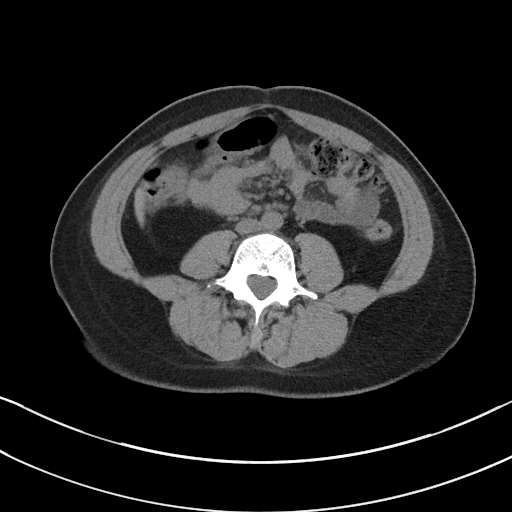
[im 55/82  soft-tissue]
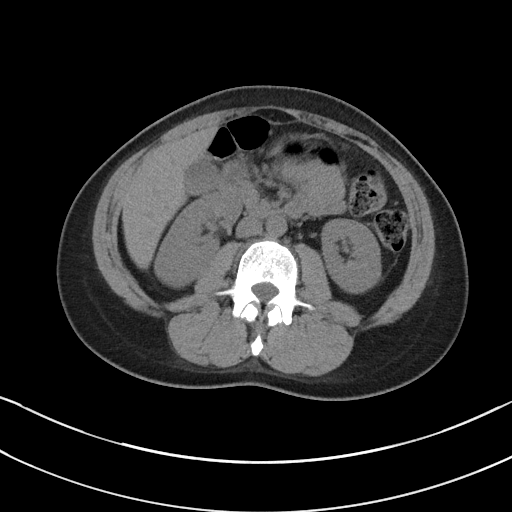
[im 55/82  bone]
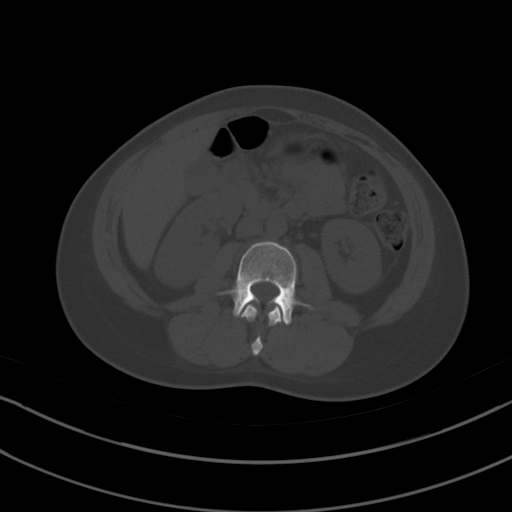
[im 58/82  soft-tissue]
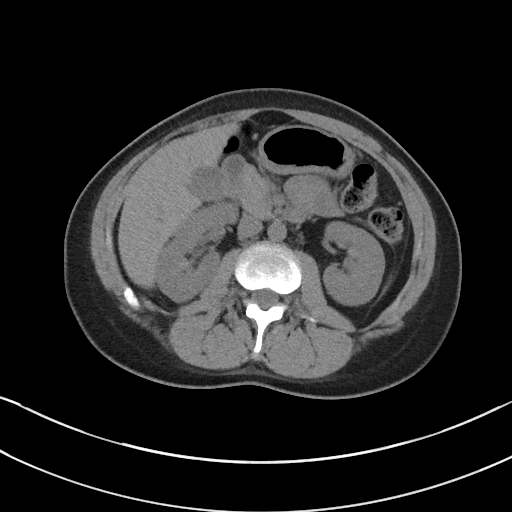
[im 65/82  soft-tissue]
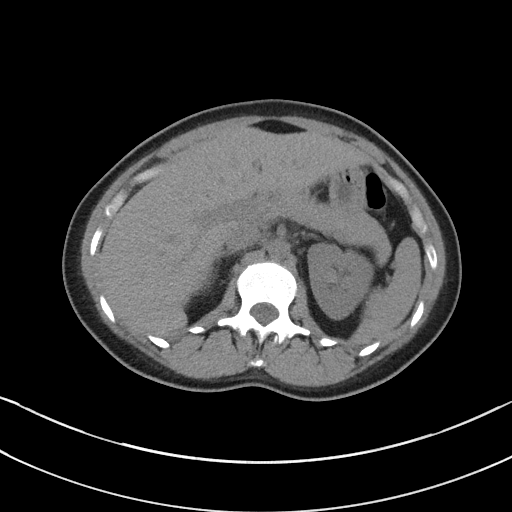
[im 71/82  soft-tissue]
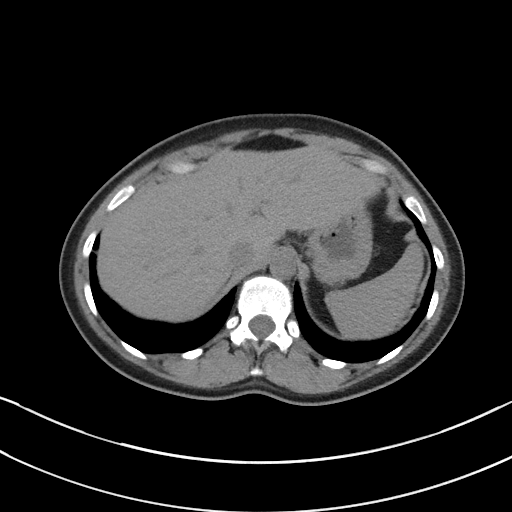
[im 78/82  soft-tissue]
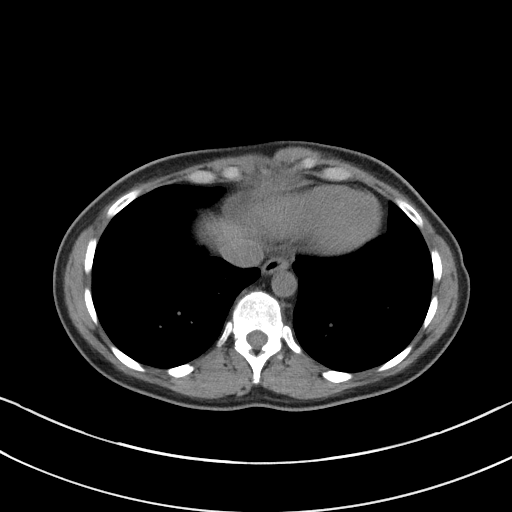

[Series 5: coronal st · coronal · 0.73mm/px · 3 of 76 slices shown]
[im 26/76  soft-tissue]
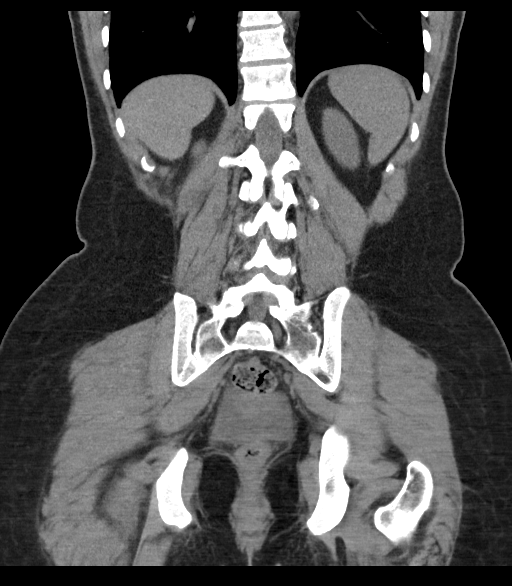
[im 34/76  soft-tissue]
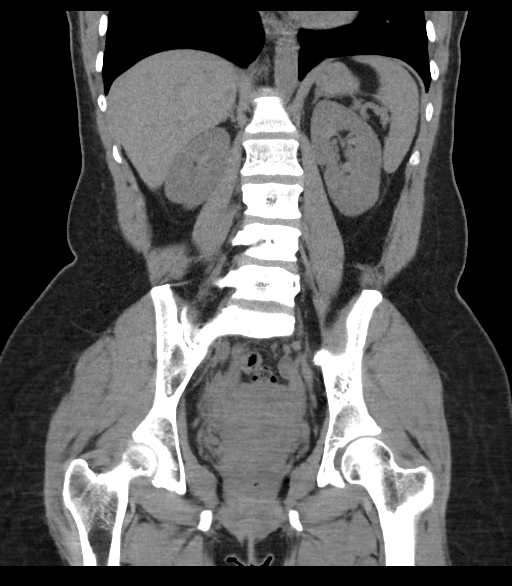
[im 42/76  soft-tissue]
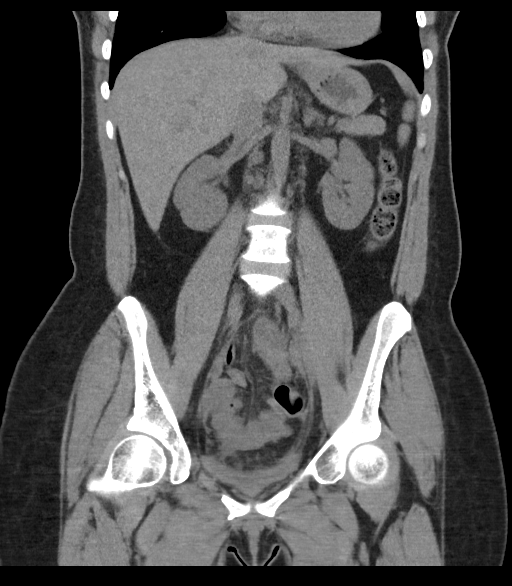

[16 of 46 positions shown; findings below may reference images not displayed]

FINDINGS: Lower chest: Included lung bases are clear.  Heart size is normal.

Hepatobiliary: Unremarkable unenhanced appearance of the liver. No
focal liver lesion identified. Gallbladder within normal limits. No
hyperdense gallstone. No biliary dilatation.

Pancreas: Unremarkable. No pancreatic ductal dilatation or
surrounding inflammatory changes.

Spleen: Normal in size without focal abnormality.

Adrenals/Urinary Tract: Adrenal glands are unremarkable. Kidneys are
normal, without renal calculi, focal lesion, or hydronephrosis.
Bladder is decompressed.

Stomach/Bowel: Stomach is within normal limits. Appendix appears
normal (series 2, image 52). No evidence of bowel wall thickening,
distention, or inflammatory changes.

Vascular/Lymphatic: No significant vascular findings are evident on
unenhanced exam. No enlarged abdominal or pelvic lymph nodes.

Reproductive: Retroverted uterus.  No adnexal masses.

Other: Small volume simple fluid density free fluid within the
cul-de-sac. No organized abdominopelvic fluid collection. No
abdominal wall hernia. A low anterior abdominal wall surgical
incision site is evident related to recent cesarean section. No
postsurgical fluid collection.

Musculoskeletal: No acute or significant osseous findings.
IMPRESSION: 1. Small volume simple fluid density free fluid within the
cul-de-sac, likely physiologic.
2. Otherwise, no acute abdominopelvic findings. Normal appendix.

## 2021-08-24 ENCOUNTER — Telehealth: Payer: Self-pay

## 2021-08-24 NOTE — Telephone Encounter (Signed)
Patient called to report that her carpal tunnel is back and worse than ever. She wants to know what the next steps are now that she is post-partum.

## 2021-08-25 NOTE — Telephone Encounter (Signed)
Its been several months, we can do another hydrodissection ASAP.

## 2021-08-25 NOTE — Telephone Encounter (Signed)
Go back to wearing the wrist splint at night, if this fails then we could certainly have her talk to a hand surgeon about a minimally invasive carpal tunnel release type surgery but other than injections, bracing, surgery there is not really another option for carpal tunnel syndrome.

## 2021-08-25 NOTE — Telephone Encounter (Signed)
Patient is looking for long-term results without surgery. She is not interested in having another injection at the time. She will call if she would like another injection.

## 2021-08-25 NOTE — Telephone Encounter (Signed)
Called and left message for patient to return call regarding additional information on options.

## 2021-08-25 NOTE — Telephone Encounter (Signed)
Patient returned call. We went over her options. She is going to purchase a new carpal tunnel brace on [employer] similar to what we provide in office. She will splint at night and during the day as needed. She will schedule appt for injection and is aware surgery is also an option if all else fails. She agrees with this plan and will follow up as needed.

## 2021-09-05 NOTE — Patient Instructions (Incomplete)
Mild persistent asthma Continue Pulmicort Flexihaler 180 mcg 2 puffs twice a day to help prevent cough and wheeze.  May use albuterol 2 puffs every 4 hours as needed for cough, wheeze, tightness in chest, or shortness of breath.  Seasonal and perennial allergic rhinitis Continue Claritin 10 mg  once a day as needed for runny nose.  Contiue Rhinocort 1-2 sprays each nostril once a day as needed for stuffy   Gastroesophageal reflux disease Continue dietary and lifestyle modifications  Food allergy  Continue to avoid banana. In case of an allergic reaction, give Benadryl *** {Blank single:19197::"teaspoonful","teaspoonfuls","capsules"} every {blank single:19197::"4","6"} hours, and if life-threatening symptoms occur, inject with EpiPen 0.3 mg.   Please let us know if this treatment plan is not working well for you Schedule a follow-up appointment in months or sooner if needed

## 2021-09-06 ENCOUNTER — Ambulatory Visit: Payer: Medicaid Other | Admitting: Family

## 2021-09-09 ENCOUNTER — Ambulatory Visit: Payer: Medicaid Other | Admitting: Medical-Surgical

## 2021-09-09 ENCOUNTER — Emergency Department (INDEPENDENT_AMBULATORY_CARE_PROVIDER_SITE_OTHER)
Admission: EM | Admit: 2021-09-09 | Discharge: 2021-09-09 | Disposition: A | Discharge status: Home / Self Care | Payer: Medicaid Other | Source: Home / Self Care | Attending: Family Medicine | Admitting: Family Medicine

## 2021-09-09 ENCOUNTER — Other Ambulatory Visit: Payer: Self-pay

## 2021-09-09 ENCOUNTER — Emergency Department (INDEPENDENT_AMBULATORY_CARE_PROVIDER_SITE_OTHER): Payer: Medicaid Other

## 2021-09-09 DIAGNOSIS — M25571 Pain in right ankle and joints of right foot: Secondary | ICD-10-CM

## 2021-09-09 DIAGNOSIS — M778 Other enthesopathies, not elsewhere classified: Secondary | ICD-10-CM

## 2021-09-09 NOTE — ED Provider Notes (Signed)
?[facility] ? ? ? ?CSN: [id number] ?Arrival date & time: 09/09/21  1630 ? ? ?  ? ?History   ?Chief Complaint ?Chief Complaint  ?Patient presents with  ? Ankle Pain  ?  RT  ? ? ?HPI ?Danahi Favaro is a 36 y.o. female.  ? ?Patient reports that yesterday about noon she noticed right anterior ankle without preceding injury or changes in activities.  She has pain with standing/walking. ? ?The history is provided by the patient.  ?Ankle Pain ?Location:  Ankle ?Time since incident:  1 day ?Injury: no   ?Ankle location:  R ankle ?Pain details:  ?  Quality:  Throbbing ?  Radiates to:  Does not radiate ?  Severity:  Moderate ?  Onset quality:  Sudden ?  Duration:  1 day ?  Timing:  Constant ?  Progression:  Unchanged ?Chronicity:  New ?Prior injury to area:  No ?Relieved by:  Nothing ?Worsened by:  Bearing weight and activity ?Ineffective treatments:  Ice ?Associated symptoms: stiffness   ?Associated symptoms: no decreased ROM, no muscle weakness, no numbness and no swelling   ? ?Past Medical History:  ?Diagnosis Date  ? Allergy   ? Asthma   ? Heart murmur   ? ? ?Patient Active Problem List  ? Diagnosis Date Noted  ? Pregnancy 10/20/2020  ? CTS (carpal tunnel syndrome) 08/25/2020  ? Cardiac murmur 04/28/2020  ? Subcutaneous mass 12/23/2019  ? Dizziness 09/04/2019  ? Contact with and (suspected) exposure to covid-19 07/24/2019  ? GERD (gastroesophageal reflux disease) 07/23/2019  ? Asthma exacerbation 02/19/2019  ? Well adult exam 02/04/2019  ? Seasonal and perennial allergic rhinitis 01/22/2019  ? Food allergy 01/22/2019  ? Mild persistent asthma 01/14/2019  ? ? ?Past Surgical History:  ?Procedure Laterality Date  ? BREAST EXCISIONAL BIOPSY Left 2007  ? CESAREAN SECTION N/A 10/20/2020  ? Procedure: CESAREAN SECTION;  Surgeon: Lavina Hamman, MD;  Location: [facility] LD ORS;  Service: Obstetrics;  Laterality: N/A;  FHR Decelerations  ? COLPOSCOPY    ? LAPAROSCOPY    ? WISDOM TOOTH EXTRACTION    ? ? ?OB History   ? ?  Gravida  ?1  ? Para  ?1  ? Term  ?1  ? Preterm  ?0  ? AB  ?0  ? Living  ?1  ?  ? ? SAB  ?0  ? IAB  ?0  ? Ectopic  ?0  ? Multiple  ?0  ? Live Births  ?1  ?   ?  ?  ? ? ? ?Home Medications   ? ?Prior to Admission medications   ?Medication Sig Start Date End Date Taking? Authorizing Provider  ?albuterol (VENTOLIN HFA) 108 (90 Base) MCG/ACT inhaler 2 puffs every 4-6 hours as needed for coughing or wheezing spells 06/15/20   Nehemiah Settle, FNP  ?budesonide (PULMICORT FLEXHALER) 180 MCG/ACT inhaler Inhale 2 puffs into the lungs 2 (two) times daily. Rinse, gargle and spit out after use. 06/17/20   Nehemiah Settle, FNP  ?budesonide (RHINOCORT AQUA) 32 MCG/ACT nasal spray Use 2 sprays in each nostril once a day as needed for stuffy nose. 06/17/20   Nehemiah Settle, FNP  ?EPINEPHrine (EPIPEN 2-PAK) 0.3 mg/0.3 mL IJ SOAJ injection Use as directed for severe allergic reactions ?Patient taking differently: Inject 0.3 mg into the muscle as needed for anaphylaxis. Use as directed for severe allergic reactions 06/22/20   Nehemiah Settle, FNP  ?Prenatal Vit-Fe Fumarate-FA (PRENATAL MULTIVITAMIN) TABS tablet Take 1 tablet by mouth  daily at 12 noon.    [provider]  ? ? ?Family History ?Family History  ?Problem Relation Age of Onset  ? Diabetes Mother   ? Hypertension Father   ? Asthma Father   ? Diabetes Father   ? Cancer Maternal Aunt   ?     colon  ? Lupus Maternal Aunt   ? Diabetes Maternal Aunt   ? Breast cancer Maternal Grandmother   ? Breast cancer Paternal Grandmother   ? Diabetes Paternal Grandmother   ? Hypertension Maternal Aunt   ? Diabetes Maternal Aunt   ? Diabetes Maternal Aunt   ? Allergic rhinitis Sister   ? Food Allergy Sister   ? Eczema Sister   ? Asthma Maternal Uncle   ? Diabetes Paternal Grandfather   ? ? ?Social History ?Social History  ? ?Tobacco Use  ? Smoking status: Former  ?  Types: E-cigarettes  ? Smokeless tobacco: Never  ?Vaping Use  ? Vaping Use: Never used  ?Substance Use Topics  ? Alcohol  use: Not Currently  ?  Comment: occ  ? Drug use: No  ? ? ? ?Allergies   ?Banana, Cefazolin, Diphenhydramine, and Latex ? ? ?Review of Systems ?Review of Systems  ?Musculoskeletal:  Positive for stiffness. Negative for joint swelling.  ?Skin:  Negative for color change and rash.  ?All other systems reviewed and are negative. ? ? ?Physical Exam ?Triage Vital Signs ?ED Triage Vitals  ?Enc Vitals Group  ?   BP 09/09/21 1654 133/89  ?   Pulse Rate 09/09/21 1654 86  ?   Resp 09/09/21 1654 17  ?   Temp 09/09/21 1654 98.4 ?F (36.9 ?C)  ?   Temp Source 09/09/21 1654 Oral  ?   SpO2 09/09/21 1654 99 %  ?   Weight --   ?   Height --   ?   Head Circumference --   ?   Peak Flow --   ?   Pain Score 09/09/21 1652 7  ?   Pain Loc --   ?   Pain Edu? --   ?   Excl. in GC? --   ? ?No data found. ? ?Updated Vital Signs ?BP 133/89 (BP Location: Right Arm)   Pulse 86   Temp 98.4 ?F (36.9 ?C) (Oral)   Resp 17   SpO2 99%   Breastfeeding Yes  ? ?Visual Acuity ?Right Eye Distance:   ?Left Eye Distance:   ?Bilateral Distance:   ? ?Right Eye Near:   ?Left Eye Near:    ?Bilateral Near:    ? ?Physical Exam ?Vitals and nursing note reviewed.  ?Constitutional:   ?   General: She is not in acute distress. ?HENT:  ?   Head: Normocephalic.  ?Eyes:  ?   Pupils: Pupils are equal, round, and reactive to light.  ?Cardiovascular:  ?   Rate and Rhythm: Normal rate.  ?Pulmonary:  ?   Effort: Pulmonary effort is normal.  ?Musculoskeletal:  ?   Right ankle: No swelling or deformity. Tenderness present. Decreased range of motion.  ?   Right Achilles Tendon: Normal.  ?     Feet: ? ?   Comments: Right foot/ankle has distinct tenderness to palpation over extensor tendons as noted on diagram.  Pain is elicited during resisted dorsiflexion of the right ankle while palpating the extensor tendons. ?  ? ?  ?Skin: ?   General: Skin is warm and dry.  ?   Findings: No rash.  ?  Neurological:  ?   General: No focal deficit present.  ?   Mental Status: She is alert.   ? ? ? ?UC Treatments / Results  ?Labs ?(all labs ordered are listed, but only abnormal results are displayed) ?Labs Reviewed - No data to display ? ?EKG ? ? ?Radiology ?DG Ankle Complete Right ? ?Result Date: 09/09/2021 ?CLINICAL DATA:  Pain EXAM: RIGHT ANKLE - COMPLETE 3+ VIEW COMPARISON:  None. FINDINGS: There is no evidence of fracture, dislocation, or joint effusion. There is no evidence of arthropathy or other focal bone abnormality. Soft tissues are unremarkable. IMPRESSION: No radiographic abnormalities are seen in the right ankle. Electronically Signed   By: Ernie Avena M.D.   On: 09/09/2021 17:16   ? ?Procedures ?Procedures (including critical care time) ? ?Medications Ordered in UC ?Medications - No data to display ? ?Initial Impression / Assessment and Plan / UC Course  ?I have reviewed the triage vital signs and the nursing notes. ? ?Pertinent labs & imaging results that were available during my care of the patient were reviewed by me and considered in my medical decision making (see chart for details). ? ?  ?No evidence fracture. ?Given treatment instructions with range of motion and stretching exercises.  ?Followup with Dr. Rodney [facility] ([facility]) if not improving about two weeks.  ?   ? ?Final Clinical Impressions(s) / UC Diagnoses  ? ?Final diagnoses:  ?Extensor tendonitis of foot  ? ? ? ?Discharge Instructions   ? ?  ?Apply ice pack for 20 to 30 minutes, 3 to 4 times daily  Continue until pain and swelling decrease.  May take Tylenol as needed for pain.  Begin range of motion and stretching exercises as tolerated. ? ? ? ?ED Prescriptions   ?None ?  ? ? ?  ?Lattie Haw, MD ?09/11/21 1954 ? ?

## 2021-09-09 NOTE — Discharge Instructions (Signed)
Apply ice pack for 20 to 30 minutes, 3 to 4 times daily  Continue until pain and swelling decrease. May take Tylenol as needed for pain.  Begin range of motion and stretching exercises as tolerated. °

## 2021-09-09 NOTE — ED Triage Notes (Signed)
Pt c/o RT ankle pain since noon time yesterday. No injury. Ice prn. Says when she went to bed with throbbing pain. Ltd ROM. Hurts to bare weight. Pain 7/10  ?

## 2021-09-13 ENCOUNTER — Ambulatory Visit: Payer: Medicaid Other | Admitting: Family

## 2021-11-28 ENCOUNTER — Telehealth: Payer: Self-pay

## 2021-11-28 NOTE — Telephone Encounter (Signed)
Pt called stating that she is having right sided chest pain at the top of her ribs and is painful to touch and is worse with movement.  Pain began after a lot of cleaning.  She denies SOB, neck pain, arm pain, N/V, and diaphoresis.  Pt scheduled with Dr. Karie Schwalbe for 12/31/2021.  However, discussed with pt red flag symptoms and the need for ER visit should symptoms change or worsen before her appt.  Pt expressed understanding and is agreeable.  Tiajuana Amass, CMA

## 2021-11-30 ENCOUNTER — Ambulatory Visit: Payer: Medicaid Other | Admitting: Sports Medicine

## 2021-12-09 ENCOUNTER — Other Ambulatory Visit: Payer: Self-pay

## 2021-12-09 ENCOUNTER — Ambulatory Visit (INDEPENDENT_AMBULATORY_CARE_PROVIDER_SITE_OTHER): Payer: Medicaid Other | Admitting: Family

## 2021-12-09 ENCOUNTER — Encounter: Payer: Self-pay | Admitting: Family

## 2021-12-09 VITALS — BP 104/66 | HR 90 | Temp 98.2°F | Resp 18 | Ht 59.5 in | Wt 102.9 lb

## 2021-12-09 DIAGNOSIS — K219 Gastro-esophageal reflux disease without esophagitis: Secondary | ICD-10-CM

## 2021-12-09 DIAGNOSIS — J4530 Mild persistent asthma, uncomplicated: Secondary | ICD-10-CM

## 2021-12-09 DIAGNOSIS — J3089 Other allergic rhinitis: Secondary | ICD-10-CM

## 2021-12-09 DIAGNOSIS — T7800XD Anaphylactic reaction due to unspecified food, subsequent encounter: Secondary | ICD-10-CM

## 2021-12-09 MED ORDER — BUDESONIDE 32 MCG/ACT NA SUSP: NASAL | 5 refills | Status: DC

## 2021-12-09 MED ORDER — EPINEPHRINE 0.3 MG/0.3ML IJ SOAJ: INTRAMUSCULAR | 1 refills | Status: DC

## 2021-12-09 MED ORDER — PULMICORT FLEXHALER 180 MCG/ACT IN AEPB: 2.0000 | INHALATION_SPRAY | Freq: Two times a day (BID) | RESPIRATORY_TRACT | 5 refills | Status: DC

## 2021-12-09 MED ORDER — FLOVENT HFA 110 MCG/ACT IN AERO: INHALATION_SPRAY | RESPIRATORY_TRACT | 5 refills | Status: DC

## 2021-12-09 MED ORDER — ALBUTEROL SULFATE HFA 108 (90 BASE) MCG/ACT IN AERS: INHALATION_SPRAY | RESPIRATORY_TRACT | 1 refills | Status: DC

## 2021-12-09 MED ORDER — PREDNISONE 10 MG PO TABS: ORAL_TABLET | ORAL | 0 refills | Status: DC

## 2021-12-09 MED ORDER — SPACER/AERO-HOLDING CHAMBERS DEVI: 1 refills | Status: AC

## 2021-12-09 NOTE — Patient Instructions (Addendum)
Mild persistent asthma Continue Pulmicort Flexihaler 180 mcg 2 puffs twice a day to help prevent cough and wheeze. This is safe to use while breastfeeding May use albuterol 2 puffs every 4 hours as needed for cough, wheeze, tightness in chest, or shortness of breath.  We will refill your albuterol inhaler since years is lost Low-dose prednisone sent in the pharmacy to have on hand should her symptoms get worse over the weekend.  (Prednisone 10 mg taking 1 tablet twice a day for 4 days, then on the fifth day take 1 tablet and stop.  Patient verbally updated on prescription being sent in).  She has questions about prednisone safety while breast-feeding.  Discussed how prednisone may be used while breast-feeding, but consider holding breast-feeding 4 hours after dose of prolonged high-dose.  No known risk of infant harm based on limited human data and drug properties: Low risk of suppressed infant growth steroid production based on drug; data available.  Also discussed possible transient decrease in milk production based on limited human data with high-dose depot steroid injection  Seasonal and perennial allergic rhinitis She wishes to hold off on starting an antihistamine while breast-feeding May use Rhinocort 1-2 sprays each nostril once a day as needed for stuffy.  This medication is okay to use while breast-feeding and is the corticosteroid of choice while breast-feeding.  Gastroesophageal reflux disease Continue dietary and lifestyle modifications When you finish breast-feeding contact your primary care physician about restarting your reflux medicine  Food allergy  Continue to avoid banana. Continue to carry epinephrine auto-injector with you at all times.  Refill sent   Please let us know if this treatment plan is not working well for you Schedule a follow-up appointment in 2 months or sooner if needed

## 2021-12-09 NOTE — Progress Notes (Signed)
[address] [place] [address] Dept: [phone]  FOLLOW UP NOTE  Patient ID: Tina [name], female    DOB: [birth year]  Age: 36 y.o. MRN: [id number] Date of Office Visit: 12/09/2021  Assessment  Chief Complaint: Asthma (Shortness of breath and wheezing ) and Allergies (Clear runny nose )  HPI Eduardo Wurth is a 36 year old female who presents today for an acute visit.  She was last seen on Nov 12, 2020 by myself for mild persistent asthma, seasonal and perennial allergic rhinitis, anaphylactic shock due to food, and gastroesophageal reflux disease.  Since her last office visit she denies any new diagnosis or surgeries.  Mild persistent asthma is reported as not well controlled.  She is taking Zarbee's twice a day.  She just restarted her Pulmicort Flexhaler 180 mcg 2 puffs twice a day this Tuesday.  She also has lost her albuterol inhaler somewhere in the house and has not seen it in a couple of months.  Her symptoms started after having a clear runny nose.  She feels like this has flared her asthma.  She reports a cough that is productive and nonproductive.  What she is able to cough up is clear color.  She also had a little bit of wheezing last night, tightness in her chest when she coughs, shortness of breath when trying to talk a lot, and nocturnal awakenings due to breathing problems.  She reports that she cannot fall asleep until 3:00 this morning.  She denies fever or chills.  She is currently breast-feeding her 96-month-old son.  Since her last office visit she has not required any systemic steroids or made any trips to the emergency room or urgent care due to breathing problems.  She wonders if Pulmicort Flexhaler is safe to use as well breast-feeding.   Perennial allergic rhinitis: She is currently using saline spray and this helps.  She does not wish to take an antihistamine at this time due to breast-feeding.  Discussed that we do have patients that do take an  antihistamine while breast-feeding, but I am okay with her not taking one.  She also has not been using Rhinocort nasal spray, but wonders if this is safe to use while breast-feeding.  She reports rhinorrhea and nasal congestion at night.  She denies postnasal drip.  She has not had any sinus infections since her last office visit.  Her clear rhinorrhea started late Saturday or Sunday.  Gastroesophageal reflux disease: She reports that she will have reflux symptoms after eating acidic foods a couple times a week.  Her primary care physician stopped her medication while she was pregnant and breast-feeding.  She is in the process of weaning breast-feeding.  She continues to avoid banana without any accidental ingestion.  She has also lost her epinephrine injector device somewhere in the house.     Drug Allergies:  Allergies  Allergen Reactions   Banana Swelling    Throat swelling   Cefazolin Swelling    Facial edema (no airway edema) during c-section after ancef. Has not been formally tested for cephalosporin allergy.    Diphenhydramine Hives   Latex Hives and Swelling    Review of Systems: Review of Systems  Constitutional:  Negative for chills and fever.  HENT:         Reports clear rhinorrhea.  She also has nasal congestion at night.  Denies postnasal drip  Eyes:        Reports itchy watery eyes on Sunday and  Monday only.  Respiratory:  Positive for cough.        Reports productive and nonproductive cough.  The sputum is clear in color.  Reports wheezing that started last night.  She has tightness in her chest when she coughs.  She also has shortness of breath while trying to talk a lot.  She also reports nocturnal awakening last night and could not fall asleep till 3 AM  Cardiovascular:  Negative for palpitations.       Reports her chest hurts with coughing.  She also reports that her heart will beat faster while in the shower.  She is spoken with her primary care physician who  reports that she has had a EKG and Holter monitor that she reports is normal.  Gastrointestinal:        Reports reflux symptoms after eating acidic foods.  She reports her primary care physician stopped this medication when she was pregnant and she is nursing  Genitourinary:  Negative for frequency.  Skin:  Negative for itching and rash.  Neurological:  Positive for headaches.       Reports history of migraines, but she does not take any medication since she is nursing  Endo/Heme/Allergies:  Positive for environmental allergies.    Physical Exam: BP 104/66   Pulse 90   Temp 98.2 F (36.8 C) (Temporal)   Resp 18   Ht 4' 11.5" (1.511 m)   Wt 102 lb 14.4 oz (46.7 kg)   SpO2 98%   BMI 20.44 kg/m    Physical Exam Constitutional:      Appearance: Normal appearance.  HENT:     Head: Normocephalic and atraumatic.     Comments: Pharynx normal, eyes normal, ears normal, nose: Bilateral lower turbinates mildly edematous and slightly erythematous with clear drainage noted    Right Ear: Tympanic membrane, ear canal and external ear normal.     Left Ear: Tympanic membrane, ear canal and external ear normal.     Mouth/Throat:     Mouth: Mucous membranes are moist.     Pharynx: Oropharynx is clear.  Eyes:     Conjunctiva/sclera: Conjunctivae normal.  Cardiovascular:     Rate and Rhythm: Normal rate and regular rhythm.     Heart sounds: Normal heart sounds.  Pulmonary:     Effort: Pulmonary effort is normal.     Breath sounds: Normal breath sounds.     Comments: Lungs clear to auscultation Musculoskeletal:     Cervical back: Neck supple.  Skin:    General: Skin is warm.  Neurological:     Mental Status: She is alert and oriented to person, place, and time.  Psychiatric:        Mood and Affect: Mood normal.        Behavior: Behavior normal.        Thought Content: Thought content normal.        Judgment: Judgment normal.    Diagnostics: FVC 2.29 L (82%), FEV1 1.82 L (77%).   Predicted FVC 2.78 L, predicted FEV1 2.35 L.  Spirometry indicates normal respiratory function.  Assessment and Plan: 1. Not well controlled mild persistent asthma   2. Seasonal and perennial allergic rhinitis   3. Anaphylactic shock due to food, subsequent encounter   4. Gastroesophageal reflux disease, unspecified whether esophagitis present     Meds ordered this encounter  Medications   albuterol (VENTOLIN HFA) 108 (90 Base) MCG/ACT inhaler    Sig: 2 puffs every 4-6 hours as needed for  coughing or wheezing spells    Dispense:  18 g    Refill:  1   budesonide (PULMICORT FLEXHALER) 180 MCG/ACT inhaler    Sig: Inhale 2 puffs into the lungs 2 (two) times daily. Rinse, gargle and spit out after use.    Dispense:  1 each    Refill:  5   budesonide (RHINOCORT AQUA) 32 MCG/ACT nasal spray    Sig: Use 2 sprays in each nostril once a day as needed for stuffy nose.    Dispense:  5 mL    Refill:  5   EPINEPHrine (EPIPEN 2-PAK) 0.3 mg/0.3 mL IJ SOAJ injection    Sig: Use as directed for severe allergic reactions    Dispense:  2 each    Refill:  1    Dispense [employer] generic only   predniSONE (DELTASONE) 10 MG tablet    Sig: Take 1 tablet twice a day for 4 days, then on the fifth day take 1 tablet and stop    Dispense:  9 tablet    Refill:  0    Patient Instructions  Mild persistent asthma Continue Pulmicort Flexihaler 180 mcg 2 puffs twice a day to help prevent cough and wheeze. This is safe to use while breastfeeding May use albuterol 2 puffs every 4 hours as needed for cough, wheeze, tightness in chest, or shortness of breath.  We will refill your albuterol inhaler since years is lost Low-dose prednisone sent in the pharmacy to have on hand should her symptoms get worse over the weekend.  (Prednisone 10 mg taking 1 tablet twice a day for 4 days, then on the fifth day take 1 tablet and stop.  Patient verbally updated on prescription being sent in).  She has questions about prednisone  safety while breast-feeding.  Discussed how prednisone may be used while breast-feeding, but consider holding breast-feeding 4 hours after dose of prolonged high-dose.  No known risk of infant harm based on limited human data and drug properties: Low risk of suppressed infant growth steroid production based on drug; data available.  Also discussed possible transient decrease in milk production based on limited human data with high-dose depot steroid injection  Seasonal and perennial allergic rhinitis She wishes to hold off on starting an antihistamine while breast-feeding May use Rhinocort 1-2 sprays each nostril once a day as needed for stuffy.  This medication is okay to use while breast-feeding and is the corticosteroid of choice while breast-feeding.  Gastroesophageal reflux disease Continue dietary and lifestyle modifications When you finish breast-feeding contact your primary care physician about restarting your reflux medicine  Food allergy  Continue to avoid banana. Continue to carry epinephrine auto-injector with you at all times.  Refill sent   Please let us know if this treatment plan is not working well for you Schedule a follow-up appointment in 2 months or sooner if needed Return in about 2 months (around 02/08/2022), or if symptoms worsen or fail to improve.    Thank you for the opportunity to care for this patient.  Please do not hesitate to contact me with questions.  Nehemiah Settlehristine Phoenyx Paulsen, FNP [facility]

## 2021-12-14 ENCOUNTER — Other Ambulatory Visit: Payer: Self-pay | Admitting: Family

## 2021-12-18 NOTE — Telephone Encounter (Signed)
What are the alternatives? Is Flovent 110 mcg not covered?

## 2022-01-11 ENCOUNTER — Ambulatory Visit: Payer: Self-pay | Admitting: *Deleted

## 2022-01-11 NOTE — Telephone Encounter (Signed)
Reason for Disposition  [1] Menstrual cycle < 21 days OR > 35 days AND [2] has occurred once this past year    Missed period for June is breast feeding.   Son is 34 months old.   Questioning if she could be pregnant should she take a pregnancy test?  Answer Assessment - Initial Assessment Questions 1. AMOUNT: "Describe the bleeding that you are having."    - SPOTTING: spotting, or pinkish / brownish mucous discharge; does not fill panty liner or pad    - MILD:  less than 1 pad / hour; less than patient's usual menstrual bleeding   - MODERATE: 1-2 pads / hour; 1 menstrual cup every 6 hours; small-medium blood clots (e.g., pea, grape, small coin)   - SEVERE: soaking 2 or more pads/hour for 2 or more hours; 1 menstrual cup every 2 hours; bleeding not contained by pads or continuous red blood from vagina; large blood clots (e.g., golf ball, large coin)      Pt is breastfeeding and wants to know if she should have take a pregnancy test?  2. ONSET: "When did the bleeding begin?" "Is it continuing now?"     I missed my period for June.  It's usually every 28 days.   My son will be 73 months old soon.    I'm still breast feeding but my period has been regular even though I'm breast feeding.   I was wondering if I could be pregnant.   She has been sexually active. 3. MENSTRUAL PERIOD: "When was the last normal menstrual period?" "How is this different than your period?"     I missed my period for June.   I'm regular every 28 days even while breast feeding. 4. REGULARITY: "How regular are your periods?"     Every 28 days 5. ABDOMINAL PAIN: "Do you have any pain?" "How bad is the pain?"  (e.g., Scale 1-10; mild, moderate, or severe)   - MILD (1-3): doesn't interfere with normal activities, abdomen soft and not tender to touch    - MODERATE (4-7): interferes with normal activities or awakens from sleep, abdomen tender to touch    - SEVERE (8-10): excruciating pain, doubled over, unable to do any normal  activities      No 6. PREGNANCY: "Could you be pregnant?" "Are you sexually active?" "Did you recently give birth?"     Possibly  I have been sexually active.   Gave birth 15 months ago. 7. BREASTFEEDING: "Are you breastfeeding?"     Yes 8. HORMONES: "Are you taking any hormone medications, prescription or OTC?" (e.g., birth control pills, estrogen)     Not asked 9. BLOOD THINNERS: "Do you take any blood thinners?" (e.g., Coumadin/warfarin, Pradaxa/dabigatran, aspirin)     Not asked 10. CAUSE: "What do you think is causing the bleeding?" (e.g., recent gyn surgery, recent gyn procedure; known bleeding disorder, cervical cancer, polycystic ovarian disease, fibroids)         Could I be pregnant again? 11. HEMODYNAMIC STATUS: "Are you weak or feeling lightheaded?" If Yes, ask: "Can you stand and walk normally?"        No 12. OTHER SYMPTOMS: "What other symptoms are you having with the bleeding?" (e.g., passed tissue, vaginal discharge, fever, menstrual-type cramps)       No other symptoms other than PMS when my period is due.  Protocols used: Vaginal Bleeding - Abnormal-A-AH  Chief Complaint: Could she be pregnant?  Symptoms: Missed period for June.   Usually  regular every 28 days. Frequency: monthly Pertinent Negatives: Patient denies cramping Disposition: [] ED /[] Urgent Care (no appt availability in office) / [] Appointment(In office/virtual)/ []  [facility]/ [x] Home Care/ [] Refused Recommended Disposition /[] [facility]/ []  Follow-up with PCP Additional Notes: To contact OB-GYN if test is positive

## 2022-01-11 NOTE — Telephone Encounter (Signed)
The patient is 15  months post partum and breast feeding   The patient has had regular menstrual cycles since November 2022   The patient shares that they have missed their cycle for June and would like to speak with a member of staff about if they need to take a pregnancy test   The patient has made contact via the community line   Left VM to call back

## 2022-01-24 ENCOUNTER — Emergency Department (HOSPITAL_BASED_OUTPATIENT_CLINIC_OR_DEPARTMENT_OTHER)
Admission: EM | Admit: 2022-01-24 | Discharge: 2022-01-25 | Disposition: A | Discharge status: Home / Self Care | Payer: Medicaid Other | Attending: Emergency Medicine | Admitting: Emergency Medicine

## 2022-01-24 ENCOUNTER — Encounter (HOSPITAL_BASED_OUTPATIENT_CLINIC_OR_DEPARTMENT_OTHER): Payer: Self-pay | Admitting: Emergency Medicine

## 2022-01-24 ENCOUNTER — Other Ambulatory Visit: Payer: Self-pay

## 2022-01-24 DIAGNOSIS — H53149 Visual discomfort, unspecified: Secondary | ICD-10-CM | POA: Diagnosis not present

## 2022-01-24 DIAGNOSIS — Z7951 Long term (current) use of inhaled steroids: Secondary | ICD-10-CM | POA: Insufficient documentation

## 2022-01-24 DIAGNOSIS — Z91040 Latex allergy status: Secondary | ICD-10-CM | POA: Insufficient documentation

## 2022-01-24 DIAGNOSIS — J45901 Unspecified asthma with (acute) exacerbation: Secondary | ICD-10-CM | POA: Insufficient documentation

## 2022-01-24 DIAGNOSIS — R42 Dizziness and giddiness: Secondary | ICD-10-CM | POA: Diagnosis not present

## 2022-01-24 DIAGNOSIS — R112 Nausea with vomiting, unspecified: Secondary | ICD-10-CM | POA: Insufficient documentation

## 2022-01-24 DIAGNOSIS — R519 Headache, unspecified: Secondary | ICD-10-CM | POA: Diagnosis not present

## 2022-01-24 DIAGNOSIS — Z79899 Other long term (current) drug therapy: Secondary | ICD-10-CM | POA: Insufficient documentation

## 2022-01-24 LAB — CBC
HCT: 34.3 % — ABNORMAL LOW (ref 36.0–46.0)
Hemoglobin: 11.3 g/dL — ABNORMAL LOW (ref 12.0–15.0)
MCH: 31 pg (ref 26.0–34.0)
MCHC: 32.9 g/dL (ref 30.0–36.0)
MCV: 94.2 fL (ref 80.0–100.0)
Platelets: 275 10*3/uL (ref 150–400)
RBC: 3.64 MIL/uL — ABNORMAL LOW (ref 3.87–5.11)
RDW: 12.8 % (ref 11.5–15.5)
WBC: 9.2 10*3/uL (ref 4.0–10.5)
nRBC: 0 % (ref 0.0–0.2)

## 2022-01-24 LAB — URINALYSIS, ROUTINE W REFLEX MICROSCOPIC
Bilirubin Urine: NEGATIVE
Glucose, UA: NEGATIVE mg/dL
Hgb urine dipstick: NEGATIVE
Ketones, ur: 40 mg/dL — AB
Leukocytes,Ua: NEGATIVE
Nitrite: NEGATIVE
Protein, ur: 30 mg/dL — AB
Specific Gravity, Urine: 1.025 (ref 1.005–1.030)
pH: 7.5 (ref 5.0–8.0)

## 2022-01-24 LAB — COMPREHENSIVE METABOLIC PANEL
ALT: 15 U/L (ref 0–44)
AST: 18 U/L (ref 15–41)
Albumin: 4.2 g/dL (ref 3.5–5.0)
Alkaline Phosphatase: 64 U/L (ref 38–126)
Anion gap: 7 (ref 5–15)
BUN: 8 mg/dL (ref 6–20)
CO2: 25 mmol/L (ref 22–32)
Calcium: 8.8 mg/dL — ABNORMAL LOW (ref 8.9–10.3)
Chloride: 105 mmol/L (ref 98–111)
Creatinine, Ser: 0.56 mg/dL (ref 0.44–1.00)
GFR, Estimated: >60 mL/min (ref >60)
Glucose, Bld: 121 mg/dL — ABNORMAL HIGH (ref 70–99)
Potassium: 3.8 mmol/L (ref 3.5–5.1)
Sodium: 137 mmol/L (ref 135–145)
Total Bilirubin: 0.5 mg/dL (ref 0.3–1.2)
Total Protein: 7.7 g/dL (ref 6.5–8.1)

## 2022-01-24 LAB — URINALYSIS, MICROSCOPIC (REFLEX)

## 2022-01-24 LAB — PREGNANCY, URINE: Preg Test, Ur: NEGATIVE

## 2022-01-24 LAB — LIPASE, BLOOD: Lipase: 28 U/L (ref 11–51)

## 2022-01-24 MED ORDER — ONDANSETRON 4 MG PO TBDP
4.0000 mg | ORAL_TABLET | Freq: Once | ORAL | Status: AC | PRN
  Administered 2022-01-24: 4 mg via ORAL
  Filled 2022-01-24: qty 1

## 2022-01-24 NOTE — ED Triage Notes (Signed)
Head ache X 2 hours with emesis, has Hx of migraines but is nursing and unable to take normal meds.

## 2022-01-25 ENCOUNTER — Telehealth: Payer: Self-pay

## 2022-01-25 ENCOUNTER — Telehealth: Payer: Self-pay | Admitting: General Practice

## 2022-01-25 MED ORDER — ONDANSETRON 4 MG PO TBDP: 4.0000 mg | ORAL_TABLET | Freq: Three times a day (TID) | ORAL | 0 refills | Status: AC | PRN

## 2022-01-25 NOTE — Telephone Encounter (Signed)
Transition Care Management Unsuccessful Follow-up Telephone Call  Date of discharge and from where:  01/25/22 from [facility] med center  Attempts:  1st Attempt  Reason for unsuccessful TCM follow-up call:  Left voice message

## 2022-01-25 NOTE — ED Provider Notes (Signed)
[facility] [place] EMERGENCY DEPARTMENT Provider Note  CSN: [id number] Arrival date & time: 01/24/22 2116  Chief Complaint(s) Headache and Emesis  HPI Tina [name] is a 36 y.o. female with h/o migraines here for migraine headache.    Headache Pain location:  Frontal Quality: pressure. Duration:  9 hours Timing:  Constant Progression:  Worsening Chronicity:  Recurrent Similar to prior headaches: yes   Associated symptoms: dizziness, nausea, photophobia and vomiting   Associated symptoms: no congestion, no cough and no fever   Emesis Associated symptoms: headaches   Associated symptoms: no cough and no fever     Past Medical History Past Medical History:  Diagnosis Date   Allergy    Asthma    Heart murmur    Patient Active Problem List   Diagnosis Date Noted   Pregnancy 10/20/2020   CTS (carpal tunnel syndrome) 08/25/2020   Cardiac murmur 04/28/2020   Subcutaneous mass 12/23/2019   Dizziness 09/04/2019   Contact with and (suspected) exposure to covid-19 07/24/2019   GERD (gastroesophageal reflux disease) 07/23/2019   Asthma exacerbation 02/19/2019   Well adult exam 02/04/2019   Seasonal and perennial allergic rhinitis 01/22/2019   Food allergy 01/22/2019   Mild persistent asthma 01/14/2019   Home Medication(s) Prior to Admission medications   Medication Sig Start Date End Date Taking? Authorizing Provider  ondansetron (ZOFRAN-ODT) 4 MG disintegrating tablet Take 1 tablet (4 mg total) by mouth every 8 (eight) hours as needed for up to 3 days for nausea or vomiting. 01/25/22 01/28/22 Yes Katalin Colledge, Amadeo Garnet, MD  albuterol (VENTOLIN HFA) 108 (90 Base) MCG/ACT inhaler 2 puffs every 4-6 hours as needed for coughing or wheezing spells 12/09/21   Nehemiah Settle, FNP  budesonide (RHINOCORT AQUA) 32 MCG/ACT nasal spray Use 2 sprays in each nostril once a day as needed for stuffy nose. 12/09/21   Nehemiah Settle, FNP  EPINEPHrine (EPIPEN 2-PAK) 0.3 mg/0.3 mL IJ  SOAJ injection Use as directed for severe allergic reactions 12/09/21   Nehemiah Settle, FNP  FLOVENT HFA 110 MCG/ACT inhaler 2 puffs into lungs twice daily with spacer 12/09/21   Nehemiah Settle, FNP  fluticasone (FLOVENT HFA) 220 MCG/ACT inhaler Inhale 2 puffs into the lungs 2 (two) times daily. 12/19/21   Nehemiah Settle, FNP  predniSONE (DELTASONE) 10 MG tablet Take 1 tablet twice a day for 4 days, then on the fifth day take 1 tablet and stop 12/09/21   Nehemiah Settle, FNP  Prenatal Vit-Fe Fumarate-FA (PRENATAL MULTIVITAMIN) TABS tablet Take 1 tablet by mouth daily at 12 noon.    [provider]  Spacer/Aero-Holding Chambers DEVI 01 spacer 12/09/21   Nehemiah Settle, FNP                                                                                                                                    Allergies Banana, Cefazolin, Diphenhydramine, and Latex  Review of Systems Review of Systems  Constitutional:  Negative for fever.  HENT:  Negative for congestion.   Eyes:  Positive for photophobia.  Respiratory:  Negative for cough.   Gastrointestinal:  Positive for nausea and vomiting.  Neurological:  Positive for dizziness and headaches.   As noted in HPI  Physical Exam Vital Signs  I have reviewed the triage vital signs BP 111/71   Pulse 72   Temp 98.7 F (37.1 C)   Resp 18   Ht 4\' 11"  (1.499 m)   Wt 47.6 kg   LMP 01/12/2022 (Exact Date)   SpO2 100%   Breastfeeding Yes   BMI 21.21 kg/m   Physical Exam Vitals reviewed.  Constitutional:      General: She is not in acute distress.    Appearance: She is well-developed. She is not diaphoretic.  HENT:     Head: Normocephalic and atraumatic.     Right Ear: External ear normal.     Left Ear: External ear normal.     Nose: Nose normal.  Eyes:     General: No scleral icterus.    Conjunctiva/sclera: Conjunctivae normal.  Neck:     Trachea: Phonation normal.  Cardiovascular:     Rate and Rhythm: Normal rate and regular  rhythm.  Pulmonary:     Effort: Pulmonary effort is normal. No respiratory distress.     Breath sounds: No stridor.  Abdominal:     General: There is no distension.  Musculoskeletal:        General: Normal range of motion.     Cervical back: Normal range of motion.  Neurological:     Mental Status: She is alert and oriented to person, place, and time.     Comments: Mental Status:  Alert and oriented to person, place, and time.  Attention and concentration normal.  Speech clear.  Recent memory is intact  Cranial Nerves:  II Visual Fields: Intact to confrontation. Visual fields intact. III, IV, VI: Pupils equal and reactive to light and near. Full eye movement without nystagmus  V Facial Sensation: Normal. No weakness of masticatory muscles  VII: No facial weakness or asymmetry  VIII Auditory Acuity: Grossly normal  IX/X: The uvula is midline; the palate elevates symmetrically  XI: Normal sternocleidomastoid and trapezius strength  XII: The tongue is midline. No atrophy or fasciculations.   Motor System: Muscle Strength: 5/5 and symmetric in the upper and lower extremities. No pronation or drift.  Muscle Tone: Tone and muscle bulk are normal in the upper and lower extremities.  Coordination: No tremor.  Sensation: Intact to light touch. Gait: Routine gait normal.   Psychiatric:        Behavior: Behavior normal.     ED Results and Treatments Labs (all labs ordered are listed, but only abnormal results are displayed) Labs Reviewed  COMPREHENSIVE METABOLIC PANEL - Abnormal; Notable for the following components:      Result Value   Glucose, Bld 121 (*)    Calcium 8.8 (*)    All other components within normal limits  CBC - Abnormal; Notable for the following components:   RBC 3.64 (*)    Hemoglobin 11.3 (*)    HCT 34.3 (*)    All other components within normal limits  URINALYSIS, ROUTINE W REFLEX MICROSCOPIC - Abnormal; Notable for the following components:   Ketones, ur  40 (*)    Protein, ur 30 (*)    All other components within normal limits  URINALYSIS, MICROSCOPIC (REFLEX) - Abnormal; Notable for the following  components:   Bacteria, UA FEW (*)    All other components within normal limits  LIPASE, BLOOD  PREGNANCY, URINE                                                                                                                         EKG  EKG Interpretation  Date/Time:    Ventricular Rate:    PR Interval:    QRS Duration:   QT Interval:    QTC Calculation:   R Axis:     Text Interpretation:         Radiology No results found.  Pertinent labs & imaging results that were available during my care of the patient were reviewed by me and considered in my medical decision making (see MDM for details).  Medications Ordered in ED Medications  ondansetron (ZOFRAN-ODT) disintegrating tablet 4 mg (4 mg Oral Given 01/24/22 2129)                                                                                                                                     Procedures Procedures  (including critical care time)  Medical Decision Making / ED Course    Complexity of Problem:  Patient's presenting problem/concern, DDX, and MDM listed below: Migraine headache Typical migraine headache for the pt.  Non focal neuro exam.  No recent head trauma.  No fever. Doubt meningitis.  Doubt intracranial bleed. Doubt IIH. No indication for imaging.  Pain had already resolved just prior to my assessment. Labs from triage for emesis below      Complexity of Data:   Laboratory Tests ordered listed below with my independent interpretation: CBC without leukocytosis.  Stable hemoglobin. Metabolic panel without significant electrolyte derangements or renal sufficiency.  No evidence of bili obstruction or pancreatitis. Urine pregnancy negative. UA without evidence of infection.   ED Course:    Assessment, Add'l Intervention, and  Reassessment: Migraine type headache Currently resolved No need for additional intervention Will rx zofran    Final Clinical Impression(s) / ED Diagnoses Final diagnoses:  Bad headache  Nausea and vomiting in adult   The patient appears reasonably screened and/or stabilized for discharge and I doubt any other medical condition or other [facility] requiring further screening, evaluation, or treatment in the ED at this time prior to discharge. Safe for discharge with strict return precautions.  Disposition: Discharge  Condition: Good  I  have discussed the results, Dx and Tx plan with the patient/family who expressed understanding and agree(s) with the plan. Discharge instructions discussed at length. The patient/family was given strict return precautions who verbalized understanding of the instructions. No further questions at time of discharge.    ED Discharge Orders          Ordered    ondansetron (ZOFRAN-ODT) 4 MG disintegrating tablet  Every 8 hours PRN        01/25/22 0049            Follow Up: Everrett Coombe, DO [address] [facility] [address]  [address] [place] [address] [phone]  Call  as needed           This chart was dictated using voice recognition software.  Despite best efforts to proofread,  errors can occur which can change the documentation meaning.    Nira Conn, MD 01/25/22 [phone]

## 2022-01-25 NOTE — Telephone Encounter (Signed)
Pt lvm stating she would like to discuss migraine medications.   Pt is currently still nursing baby.   Please call and schedule pt for Virtual Visit.

## 2022-01-31 NOTE — Telephone Encounter (Signed)
Patient has been scheduled for a virtual visit with dr Ashley Royalty. AMUCK

## 2022-01-31 NOTE — Telephone Encounter (Signed)
Please call for appt  

## 2022-01-31 NOTE — Telephone Encounter (Signed)
Transition Care Management Unsuccessful Follow-up Telephone Call  Date of discharge and from where:  01/25/22 from [facility]  Attempts:  2nd Attempt  Reason for unsuccessful TCM follow-up call:  Left voice message

## 2022-02-01 NOTE — Telephone Encounter (Signed)
Transition Care Management Unsuccessful Follow-up Telephone Call  Date of discharge and from where:  01/25/22 from [facility]  Attempts:  3rd Attempt  Reason for unsuccessful TCM follow-up call:  Left voice message

## 2022-02-02 ENCOUNTER — Encounter: Payer: Self-pay | Admitting: Family Medicine

## 2022-02-02 ENCOUNTER — Telehealth (INDEPENDENT_AMBULATORY_CARE_PROVIDER_SITE_OTHER): Payer: Medicaid Other | Admitting: Family Medicine

## 2022-02-02 DIAGNOSIS — G43009 Migraine without aura, not intractable, without status migrainosus: Secondary | ICD-10-CM | POA: Diagnosis not present

## 2022-02-02 DIAGNOSIS — G43909 Migraine, unspecified, not intractable, without status migrainosus: Secondary | ICD-10-CM | POA: Insufficient documentation

## 2022-02-02 NOTE — Progress Notes (Signed)
Tina [name] - 36 y.o. female MRN [id number]  Date of birth: [birth year]   This visit type was conducted due to national recommendations for restrictions regarding the COVID-19 Pandemic (e.g. social distancing).  This format is felt to be most appropriate for this patient at this time.  All issues noted in this document were discussed and addressed.  No physical exam was performed (except for noted visual exam findings with Video Visits).  I discussed the limitations of evaluation and management by telemedicine and the availability of in person appointments. The patient expressed understanding and agreed to proceed.  I connected withNAME@ on 02/02/22 at  1:10 PM EDT by a video enabled telemedicine application and verified that I am speaking with the correct person using two identifiers.  Present at visit: Everrett Coombe, DO Marilynn Latino   Patient Location: Home [address] [place] [address]   Provider location:   [facility]  Chief Complaint  Patient presents with   Headache    HPI  Tina [name] is a 36 y.o. female who presents via audio/video conferencing for a telehealth visit today.  She is following up for migraines.  She has noted an increase in migraines recently.  Previously treated with imitrex.  She has not tried anything OTC so far.  She is in the process of weaning from breast feeding.  Migraines are fairly typical with headache, light sensitivity, nausea and flashes of light.  These occur 2-3x per week.  Reports sleep is adequate.  Could do better with hydration.     ROS:  A comprehensive ROS was completed and negative except as noted per HPI  Past Medical History:  Diagnosis Date   Allergy    Asthma    Heart murmur     Past Surgical History:  Procedure Laterality Date   BREAST EXCISIONAL BIOPSY Left 2007   CESAREAN SECTION N/A 10/20/2020   Procedure: CESAREAN SECTION;  Surgeon: Lavina Hamman, MD;  Location: [facility] LD ORS;  Service:  Obstetrics;  Laterality: N/A;  FHR Decelerations   COLPOSCOPY     LAPAROSCOPY     WISDOM TOOTH EXTRACTION      Family History  Problem Relation Age of Onset   Diabetes Mother    Hypertension Father    Asthma Father    Diabetes Father    Cancer Maternal Aunt        colon   Lupus Maternal Aunt    Diabetes Maternal Aunt    Breast cancer Maternal Grandmother    Breast cancer Paternal Grandmother    Diabetes Paternal Grandmother    Hypertension Maternal Aunt    Diabetes Maternal Aunt    Diabetes Maternal Aunt    Allergic rhinitis Sister    Food Allergy Sister    Eczema Sister    Asthma Maternal Uncle    Diabetes Paternal Grandfather     Social History   Socioeconomic History   Marital status: Single    Spouse name: Not on file   Number of children: Not on file   Years of education: Not on file   Highest education level: Not on file  Occupational History   Not on file  Tobacco Use   Smoking status: Former    Types: E-cigarettes   Smokeless tobacco: Never  Vaping Use   Vaping Use: Never used  Substance and Sexual Activity   Alcohol use: Not Currently    Comment: occ   Drug use: No   Sexual activity: Yes  Birth control/protection: Condom  Other Topics Concern   Not on file  Social History Narrative   Not on file   Social Determinants of Health   Financial Resource Strain: Low Risk  (02/04/2019)   Overall Financial Resource Strain (CARDIA)    Difficulty of Paying Living Expenses: Not hard at all  Food Insecurity: No Food Insecurity (02/04/2019)   Hunger Vital Sign    Worried About Running Out of Food in the Last Year: Never true    Ran Out of Food in the Last Year: Never true  Transportation Needs: No Transportation Needs (02/04/2019)   PRAPARE - Administrator, Civil Service (Medical): No    Lack of Transportation (Non-Medical): No  Physical Activity: Inactive (08/23/2017)   Exercise Vital Sign    Days of Exercise per Week: 0 days    Minutes of  Exercise per Session: 0 min  Stress: No Stress Concern Present (08/23/2017)   [employer] of Occupational Health - Occupational Stress Questionnaire    Feeling of Stress : Not at all  Social Connections: Somewhat Isolated (08/23/2017)   Social Connection and Isolation Panel [NHANES]    Frequency of Communication with Friends and Family: More than three times a week    Frequency of Social Gatherings with Friends and Family: Once a week    Attends Religious Services: Never    Database administrator or Organizations: Yes    Attends Banker Meetings: 1 to 4 times per year    Marital Status: Never married  Intimate Partner Violence: Not At Risk (08/23/2017)   Humiliation, Afraid, Rape, and Kick questionnaire    Fear of Current or Ex-Partner: No    Emotionally Abused: No    Physically Abused: No    Sexually Abused: No     Current Outpatient Medications:    albuterol (VENTOLIN HFA) 108 (90 Base) MCG/ACT inhaler, 2 puffs every 4-6 hours as needed for coughing or wheezing spells, Disp: 18 g, Rfl: 1   budesonide (RHINOCORT AQUA) 32 MCG/ACT nasal spray, Use 2 sprays in each nostril once a day as needed for stuffy nose., Disp: 5 mL, Rfl: 5   EPINEPHrine (EPIPEN 2-PAK) 0.3 mg/0.3 mL IJ SOAJ injection, Use as directed for severe allergic reactions, Disp: 2 each, Rfl: 1   FLOVENT HFA 110 MCG/ACT inhaler, 2 puffs into lungs twice daily with spacer, Disp: 1 each, Rfl: 5   Prenatal Vit-Fe Fumarate-FA (PRENATAL MULTIVITAMIN) TABS tablet, Take 1 tablet by mouth daily at 12 noon., Disp: , Rfl:    Spacer/Aero-Holding Chambers DEVI, 01 spacer, Disp: 1 each, Rfl: 1  EXAM:  VITALS per patient if applicable: Pulse 88   Ht 4\' 11"  (1.499 m)   Wt 104 lb (47.2 kg)   LMP 01/12/2022 (Exact Date)   SpO2 100%   BMI 21.01 kg/m   GENERAL: alert, oriented, appears well and in no acute distress  HEENT: atraumatic, conjunttiva clear, no obvious abnormalities on inspection of external nose and  ears  NECK: normal movements of the head and neck  LUNGS: on inspection no signs of respiratory distress, breathing rate appears normal, no obvious gross SOB, gasping or wheezing  CV: no obvious cyanosis  MS: moves all visible extremities without noticeable abnormality  PSYCH/NEURO: pleasant and cooperative, no obvious depression or anxiety, speech and thought processing grossly intact  ASSESSMENT AND PLAN:  Discussed the following assessment and plan:  Migraines She may try ibuprofen or excedrin initially for migraines.  Reminded to stay well  hydrated and get plenty of sleep.  We can add imitrex back on if needed as this is generally regarded as being safe during lactation.       I discussed the assessment and treatment plan with the patient. The patient was provided an opportunity to ask questions and all were answered. The patient agreed with the plan and demonstrated an understanding of the instructions.   The patient was advised to call back or seek an in-person evaluation if the symptoms worsen or if the condition fails to improve as anticipated.    Everrett Coombe, DO

## 2022-02-02 NOTE — Progress Notes (Signed)
Unable to take migraine medication previously prescribed due to nursing baby.

## 2022-02-02 NOTE — Assessment & Plan Note (Signed)
She may try ibuprofen or excedrin initially for migraines.  Reminded to stay well hydrated and get plenty of sleep.  We can add imitrex back on if needed as this is generally regarded as being safe during lactation.

## 2022-02-10 ENCOUNTER — Encounter: Payer: Self-pay | Admitting: Family

## 2022-02-10 ENCOUNTER — Ambulatory Visit: Payer: Medicaid Other | Admitting: Family

## 2022-02-10 ENCOUNTER — Ambulatory Visit (INDEPENDENT_AMBULATORY_CARE_PROVIDER_SITE_OTHER): Payer: Medicaid Other | Admitting: Family

## 2022-02-10 VITALS — BP 122/68 | HR 78 | Temp 98.2°F | Resp 18 | Wt 103.7 lb

## 2022-02-10 DIAGNOSIS — T7800XD Anaphylactic reaction due to unspecified food, subsequent encounter: Secondary | ICD-10-CM | POA: Diagnosis not present

## 2022-02-10 DIAGNOSIS — J4530 Mild persistent asthma, uncomplicated: Secondary | ICD-10-CM | POA: Diagnosis not present

## 2022-02-10 DIAGNOSIS — R059 Cough, unspecified: Secondary | ICD-10-CM

## 2022-02-10 DIAGNOSIS — K219 Gastro-esophageal reflux disease without esophagitis: Secondary | ICD-10-CM | POA: Diagnosis not present

## 2022-02-10 DIAGNOSIS — J3089 Other allergic rhinitis: Secondary | ICD-10-CM | POA: Diagnosis not present

## 2022-02-10 NOTE — Patient Instructions (Signed)
Mild persistent asthma Continue Pulmicort Flexihaler 180 mcg 2 puffs twice a day to help prevent cough and wheeze. This is safe to use while breastfeeding May use albuterol 2 puffs every 4 hours as needed for cough, wheeze, tightness in chest, or shortness of breath.    Seasonal and perennial allergic rhinitis She wishes to hold off on starting an antihistamine while breast-feeding May use Rhinocort 1-2 sprays each nostril once a day as needed for stuffy.  This medication is okay to use while breast-feeding and is the corticosteroid of choice while breast-feeding.  Gastroesophageal reflux disease Continue dietary and lifestyle modifications Recommended 30 day trial of omeprazole to see if this helps with cough after eating. She would like to speak with her primary care physician first. Discussed how I do not think her cough is due to food allergies.  Food allergy  Continue to avoid banana. Continue to carry epinephrine auto-injector with you at all times.     Please let us know if this treatment plan is not working well for you Schedule a follow-up appointment in 6 months or sooner if needed

## 2022-02-10 NOTE — Progress Notes (Signed)
[name] [id number] Dept: [phone]  FOLLOW UP NOTE  Patient ID: Tina [name], female    DOB: [birth year]  Age: 36 y.o. MRN: [id number] Date of Office Visit: 02/10/2022  Assessment  Chief Complaint: Follow-up and Breathing Problem (Asthma follow up doing well)  HPI Tina [name] is a 36 year old female who presents today for follow-up of mild persistent asthma, seasonal and perennial allergic rhinitis, anaphylactic shock due to food, and gastroesophageal reflux disease.  She was last seen on December 09, 2021 by myself.  She denies any new diagnosis or surgery since her last office visit.  She did  go to the emergency room due to her migraines and was prescribed a new medication.  Mild persistent asthma: She currently takes Pulmicort Flexhaler 180 mcg 2 puffs twice a day and albuterol as needed.  She uses her albuterol inhaler maybe 2-3 times a month.  She reports coughing when she is hot and denies wheezing, tightness in chest, shortness of breath, and nocturnal awakenings due to breathing problems.  She also reports that she coughs every time she eats.  It does not matter what she eats.  The cough will last a couple minutes and then will be gone.  The cough can start immediately after starting eating or a few minutes after.  She mentions that when she was younger she realized dairy caused her to cough.  She wonders if she has a gluten allergy.  She denies abdominal pain outside of having reflux or gas.  She also have abdominal bloating occasionally but then she goes to the bathroom and she is okay.  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  She has not seen GI since she was diagnosed in 2011.  Offered her to try avoiding gluten free foods to see if this helps.  Since her last office visit she has not required any systemic steroids and did not take the prescription for prednisone that we  prescribed at her last office visit.  She has also not made any trips to the emergency room or urgent care due to breathing problems.  Seasonal and perennial allergic rhinitis: She reports rhinorrhea couple weeks ago that lasted for a few days.  She will also have nasal congestion when it rains.  She denies postnasal drip.  She has not had any sinus infections since we last saw her.  She is currently not taking an antihistamine because she is still breast-feeding.  She does use Rhinocort nasal spray as needed.  Gastroesophageal reflux disease.  She reports that she will have heartburn maybe a couple times a month.  When she has the heartburn that she will burp and then she will be fine.  Her heartburn is not as bad as what it used to be.  Discussed starting a 30-day trial of omeprazole to see if this helps with the cough since she was previously on reflux medicine before getting pregnant.  She would like to speak with her primary care physician first and then go from there.  She continues to avoid banana without any accidental ingestion or use of her epinephrine autoinjector device.  Her epinephrine autoinjector device is up-to-date.   Drug Allergies:  Allergies  Allergen Reactions   Banana Swelling    Throat swelling   Cefazolin Swelling    Facial edema (no airway edema) during c-section after ancef. Has not been formally tested for cephalosporin  allergy.    Diphenhydramine Hives   Latex Hives and Swelling    Review of Systems: Review of Systems  Constitutional:  Negative for chills and fever.  HENT:         Reports rhinorrhea couple weeks ago that lasted only a few days.  She also reports nasal congestion when it rains.  She denies postnasal drip.  Eyes:        Reports watery eyes if watching TV too long.  Denies itchy eyes  Respiratory:  Positive for cough. Negative for shortness of breath and wheezing.        Reports coughing if she is hot or after she eats every time.    Cardiovascular:  Negative for chest pain and palpitations.  Gastrointestinal:        Reports heartburn and will have a burp and be fine.  It occurs maybe a couple times a month.  Genitourinary:  Negative for frequency.  Skin:  Negative for itching and rash.  Neurological:  Positive for headaches.       Reports migraines  Endo/Heme/Allergies:  Positive for environmental allergies.    Physical Exam: BP 122/68 (BP Location: Left Arm, Patient Position: Sitting, Cuff Size: Normal)   Pulse 78   Temp 98.2 F (36.8 C) (Temporal)   Resp 18   Wt 103 lb 11.2 oz (47 kg)   LMP 01/12/2022 (Exact Date)   SpO2 100%   BMI 20.94 kg/m    Physical Exam Constitutional:      Appearance: Normal appearance.  HENT:     Head: Normocephalic and atraumatic.     Comments: Pharynx normal, eyes normal, ears normal, nose normal    Right Ear: Tympanic membrane, ear canal and external ear normal.     Left Ear: Tympanic membrane, ear canal and external ear normal.     Nose: Nose normal.     Mouth/Throat:     Mouth: Mucous membranes are moist.     Pharynx: Oropharynx is clear.  Eyes:     Conjunctiva/sclera: Conjunctivae normal.  Cardiovascular:     Rate and Rhythm: Normal rate and regular rhythm.     Heart sounds: Normal heart sounds.  Pulmonary:     Effort: Pulmonary effort is normal.     Breath sounds: Normal breath sounds.     Comments: Lungs clear to auscultation Musculoskeletal:     Cervical back: Neck supple.  Skin:    General: Skin is warm.  Neurological:     Mental Status: She is alert and oriented to person, place, and time.  Psychiatric:        Mood and Affect: Mood normal.        Behavior: Behavior normal.        Thought Content: Thought content normal.        Judgment: Judgment normal.     Diagnostics: FVC 2.40 L (86%), FEV1 1.97 L (84%).  Predicted FVC 2.78 L, predicted FEV1 2.5.  Spirometry indicates normal respiratory function.  Assessment and Plan: 1. Mild persistent asthma  without complication   2. Seasonal and perennial allergic rhinitis   3. Gastroesophageal reflux disease, unspecified whether esophagitis present   4. Anaphylactic shock due to food, subsequent encounter   5. Cough, unspecified type     No orders of the defined types were placed in this encounter.   Patient Instructions  Mild persistent asthma Continue Pulmicort Flexihaler 180 mcg 2 puffs twice a day to help prevent cough and wheeze. This is safe to use  while breastfeeding May use albuterol 2 puffs every 4 hours as needed for cough, wheeze, tightness in chest, or shortness of breath.    Seasonal and perennial allergic rhinitis She wishes to hold off on starting an antihistamine while breast-feeding May use Rhinocort 1-2 sprays each nostril once a day as needed for stuffy.  This medication is okay to use while breast-feeding and is the corticosteroid of choice while breast-feeding.  Gastroesophageal reflux disease Continue dietary and lifestyle modifications Recommended 30 day trial of omeprazole to see if this helps with cough after eating. She would like to speak with her primary care physician first. Discussed how I do not think her cough is due to food allergies.  Food allergy  Continue to avoid banana. Continue to carry epinephrine auto-injector with you at all times.     Please let us know if this treatment plan is not working well for you Schedule a follow-up appointment in 6 months or sooner if needed  Return in about 6 months (around 08/13/2022), or if symptoms worsen or fail to improve.    Thank you for the opportunity to care for this patient.  Please do not hesitate to contact me with questions.  Nehemiah Settle, FNP [facility]

## 2022-03-15 ENCOUNTER — Telehealth (INDEPENDENT_AMBULATORY_CARE_PROVIDER_SITE_OTHER): Payer: Medicaid Other | Admitting: Family Medicine

## 2022-03-15 ENCOUNTER — Encounter: Payer: Self-pay | Admitting: Family Medicine

## 2022-03-15 DIAGNOSIS — G43009 Migraine without aura, not intractable, without status migrainosus: Secondary | ICD-10-CM

## 2022-03-15 DIAGNOSIS — R040 Epistaxis: Secondary | ICD-10-CM

## 2022-03-15 MED ORDER — MUPIROCIN 2 % EX OINT: 1.0000 | TOPICAL_OINTMENT | Freq: Two times a day (BID) | CUTANEOUS | 0 refills | Status: DC

## 2022-03-15 NOTE — Assessment & Plan Note (Signed)
Increased frequency of migraines recently.  Fairly well controlled with Motrin as needed.  She may continue this however we did discuss having her check her blood pressure.  She will stop by the clinic for nurse visit to have this checked.

## 2022-03-15 NOTE — Assessment & Plan Note (Signed)
Recommend addition of nasal saline.  Adding mupirocin ointment as well to be applied inside the nose twice a day for a week.

## 2022-03-15 NOTE — Progress Notes (Signed)
Tina [name] - 36 y.o. female MRN [id number]  Date of birth: [birth year]   This visit type was conducted due to national recommendations for restrictions regarding the COVID-19 Pandemic (e.g. social distancing).  This format is felt to be most appropriate for this patient at this time.  All issues noted in this document were discussed and addressed.  No physical exam was performed (except for noted visual exam findings with Video Visits).  I discussed the limitations of evaluation and management by telemedicine and the availability of in person appointments. The patient expressed understanding and agreed to proceed.  I connected withNAME@ on 03/15/22 at  1:10 PM EDT by a video enabled telemedicine application and verified that I am speaking with the correct person using two identifiers.  Present at visit: Tina Coombe, DO Tina [name]   Patient Location: Home  [address] [place] [address]   Provider location:   [facility]  Chief Complaint  Patient presents with   Epistaxis   Headache    HPI  Tina [name] is a 36 y.o. female who presents via audio/video conferencing for a telehealth visit today.  She has had a few nosebleeds over the past few days.  Has had this problem in the past but has not lasted this long.  She is using a humidifier.  She has also been using Flonase.  Additionally, she has had recurrent headaches.  She does have history of headaches and migraines.  She has been using ibuprofen which does provide some relief.  She has not checked her blood pressure at home.  She denies sinus pain or pressure.  No fever or chills.  ROS:  A comprehensive ROS was completed and negative except as noted per HPI    Past Medical History:  Diagnosis Date   Allergy    Asthma    Heart murmur     Past Surgical History:  Procedure Laterality Date   BREAST EXCISIONAL BIOPSY Left 2007   CESAREAN SECTION N/A 10/20/2020   Procedure: CESAREAN  SECTION;  Surgeon: Lavina Hamman, MD;  Location: [facility] LD ORS;  Service: Obstetrics;  Laterality: N/A;  FHR Decelerations   COLPOSCOPY     LAPAROSCOPY     WISDOM TOOTH EXTRACTION      Family History  Problem Relation Age of Onset   Diabetes Mother    Hypertension Father    Asthma Father    Diabetes Father    Cancer Maternal Aunt        colon   Lupus Maternal Aunt    Diabetes Maternal Aunt    Breast cancer Maternal Grandmother    Breast cancer Paternal Grandmother    Diabetes Paternal Grandmother    Hypertension Maternal Aunt    Diabetes Maternal Aunt    Diabetes Maternal Aunt    Allergic rhinitis Sister    Food Allergy Sister    Eczema Sister    Asthma Maternal Uncle    Diabetes Paternal Grandfather     Social History   Socioeconomic History   Marital status: Single    Spouse name: Not on file   Number of children: Not on file   Years of education: Not on file   Highest education level: Not on file  Occupational History   Not on file  Tobacco Use   Smoking status: Former    Types: E-cigarettes   Smokeless tobacco: Never  Vaping Use   Vaping Use: Never used  Substance and Sexual Activity  Alcohol use: Not Currently    Comment: occ   Drug use: No   Sexual activity: Yes    Birth control/protection: Condom  Other Topics Concern   Not on file  Social History Narrative   Not on file   Social Determinants of Health   Financial Resource Strain: Low Risk  (02/04/2019)   Overall Financial Resource Strain (CARDIA)    Difficulty of Paying Living Expenses: Not hard at all  Food Insecurity: No Food Insecurity (02/04/2019)   Hunger Vital Sign    Worried About Running Out of Food in the Last Year: Never true    Ran Out of Food in the Last Year: Never true  Transportation Needs: No Transportation Needs (02/04/2019)   PRAPARE - Administrator, Civil Service (Medical): No    Lack of Transportation (Non-Medical): No  Physical Activity: Inactive (08/23/2017)    Exercise Vital Sign    Days of Exercise per Week: 0 days    Minutes of Exercise per Session: 0 min  Stress: No Stress Concern Present (08/23/2017)   [employer] of Occupational Health - Occupational Stress Questionnaire    Feeling of Stress : Not at all  Social Connections: Somewhat Isolated (08/23/2017)   Social Connection and Isolation Panel [NHANES]    Frequency of Communication with Friends and Family: More than three times a week    Frequency of Social Gatherings with Friends and Family: Once a week    Attends Religious Services: Never    Database administrator or Organizations: Yes    Attends Banker Meetings: 1 to 4 times per year    Marital Status: Never married  Intimate Partner Violence: Not At Risk (08/23/2017)   Humiliation, Afraid, Rape, and Kick questionnaire    Fear of Current or Ex-Partner: No    Emotionally Abused: No    Physically Abused: No    Sexually Abused: No     Current Outpatient Medications:    albuterol (VENTOLIN HFA) 108 (90 Base) MCG/ACT inhaler, 2 puffs every 4-6 hours as needed for coughing or wheezing spells, Disp: 18 g, Rfl: 1   budesonide (RHINOCORT AQUA) 32 MCG/ACT nasal spray, Use 2 sprays in each nostril once a day as needed for stuffy nose., Disp: 5 mL, Rfl: 5   EPINEPHrine (EPIPEN 2-PAK) 0.3 mg/0.3 mL IJ SOAJ injection, Use as directed for severe allergic reactions, Disp: 2 each, Rfl: 1   FLOVENT HFA 110 MCG/ACT inhaler, 2 puffs into lungs twice daily with spacer, Disp: 1 each, Rfl: 5   mupirocin ointment (BACTROBAN) 2 %, Apply 1 Application topically 2 (two) times daily. Apply to inside of nose x7 days, Disp: 22 g, Rfl: 0   Prenatal Vit-Fe Fumarate-FA (PRENATAL MULTIVITAMIN) TABS tablet, Take 1 tablet by mouth daily at 12 noon., Disp: , Rfl:    Spacer/Aero-Holding Chambers DEVI, 01 spacer, Disp: 1 each, Rfl: 1  EXAM:  VITALS per patient if applicable: Ht 4\' 11"  (1.499 m)   Wt 103 lb 11.2 oz (47 kg)   BMI 20.94 kg/m    GENERAL: alert, oriented, appears well and in no acute distress  HEENT: atraumatic, conjunttiva clear, no obvious abnormalities on inspection of external nose and ears  NECK: normal movements of the head and neck  LUNGS: on inspection no signs of respiratory distress, breathing rate appears normal, no obvious gross SOB, gasping or wheezing  CV: no obvious cyanosis  MS: moves all visible extremities without noticeable abnormality  PSYCH/NEURO: pleasant and cooperative, no  obvious depression or anxiety, speech and thought processing grossly intact  ASSESSMENT AND PLAN:  Discussed the following assessment and plan:  Migraines Increased frequency of migraines recently.  Fairly well controlled with Motrin as needed.  She may continue this however we did discuss having her check her blood pressure.  She will stop by the clinic for nurse visit to have this checked.  Epistaxis Recommend addition of nasal saline.  Adding mupirocin ointment as well to be applied inside the nose twice a day for a week.     I discussed the assessment and treatment plan with the patient. The patient was provided an opportunity to ask questions and all were answered. The patient agreed with the plan and demonstrated an understanding of the instructions.   The patient was advised to call back or seek an in-person evaluation if the symptoms worsen or if the condition fails to improve as anticipated.    Tina Coombe, DO

## 2022-03-15 NOTE — Progress Notes (Signed)
Headaches x 5 days Nosebleeds at night or 1st thing in the morning x 3 times this week.   Patient uses humidifier at night. Asthma pt.

## 2022-03-16 ENCOUNTER — Ambulatory Visit (INDEPENDENT_AMBULATORY_CARE_PROVIDER_SITE_OTHER): Payer: Medicaid Other | Admitting: Family Medicine

## 2022-03-16 VITALS — BP 131/86 | HR 72 | Temp 98.6°F | Ht 59.0 in | Wt 106.0 lb

## 2022-03-16 DIAGNOSIS — G43009 Migraine without aura, not intractable, without status migrainosus: Secondary | ICD-10-CM

## 2022-03-16 NOTE — Progress Notes (Signed)
Medical screening examination/treatment was performed by qualified clinical staff member and as supervising physician I was immediately available for consultation/collaboration. I have reviewed documentation and agree with assessment and plan.  Burnett Spray, DO  

## 2022-03-16 NOTE — Progress Notes (Signed)
Patient is here for blood pressure check. Denies trouble sleeping, palpitations, dizziness, lightheadedness, blurry vision, chest pain, shortness of breath, headaches and/or medication problems.   Patient's blood pressure was within goal range. Provider notified of current blood pressure reading. Patient requested to have labs drawn. Per provider - CMP, CBC and TSH ordered.   Patient informed to schedule next appointment as instructed by provider.

## 2022-03-17 LAB — CBC WITH DIFFERENTIAL/PLATELET
Absolute Monocytes: 360 cells/uL (ref 200–950)
Basophils Absolute: 29 cells/uL (ref 0–200)
Basophils Relative: 0.6 %
Eosinophils Absolute: 288 cells/uL (ref 15–500)
Eosinophils Relative: 6 %
HCT: 35.5 % (ref 35.0–45.0)
Hemoglobin: 11.5 g/dL — ABNORMAL LOW (ref 11.7–15.5)
Lymphs Abs: 1594 cells/uL (ref 850–3900)
MCH: 30.9 pg (ref 27.0–33.0)
MCHC: 32.4 g/dL (ref 32.0–36.0)
MCV: 95.4 fL (ref 80.0–100.0)
MPV: 9.4 fL (ref 7.5–12.5)
Monocytes Relative: 7.5 %
Neutro Abs: 2530 cells/uL (ref 1500–7800)
Neutrophils Relative %: 52.7 %
Platelets: 252 10*3/uL (ref 140–400)
RBC: 3.72 10*6/uL — ABNORMAL LOW (ref 3.80–5.10)
RDW: 12.4 % (ref 11.0–15.0)
Total Lymphocyte: 33.2 %
WBC: 4.8 10*3/uL (ref 3.8–10.8)

## 2022-03-17 LAB — COMPLETE METABOLIC PANEL WITH GFR
AG Ratio: 1.7 (calc) (ref 1.0–2.5)
ALT: 10 U/L (ref 6–29)
AST: 13 U/L (ref 10–30)
Albumin: 4.5 g/dL (ref 3.6–5.1)
Alkaline phosphatase (APISO): 66 U/L (ref 31–125)
BUN: 9 mg/dL (ref 7–25)
CO2: 27 mmol/L (ref 20–32)
Calcium: 9.1 mg/dL (ref 8.6–10.2)
Chloride: 105 mmol/L (ref 98–110)
Creat: 0.66 mg/dL (ref 0.50–0.97)
Globulin: 2.7 g/dL (calc) (ref 1.9–3.7)
Glucose, Bld: 73 mg/dL (ref 65–99)
Potassium: 3.9 mmol/L (ref 3.5–5.3)
Sodium: 139 mmol/L (ref 135–146)
Total Bilirubin: 0.6 mg/dL (ref 0.2–1.2)
Total Protein: 7.2 g/dL (ref 6.1–8.1)
eGFR: 117 mL/min/{1.73_m2} (ref >=60)

## 2022-03-17 LAB — TSH: TSH: 2.34 mIU/L

## 2022-03-19 ENCOUNTER — Encounter: Payer: Self-pay | Admitting: Family Medicine

## 2022-03-31 ENCOUNTER — Ambulatory Visit: Payer: Medicaid Other | Admitting: Medical-Surgical

## 2022-03-31 ENCOUNTER — Encounter: Payer: Self-pay | Admitting: Medical-Surgical

## 2022-03-31 ENCOUNTER — Ambulatory Visit (HOSPITAL_BASED_OUTPATIENT_CLINIC_OR_DEPARTMENT_OTHER)
Admission: RE | Admit: 2022-03-31 | Discharge: 2022-03-31 | Disposition: A | Discharge status: Home / Self Care | Payer: Medicaid Other | Source: Ambulatory Visit | Attending: Medical-Surgical | Admitting: Medical-Surgical

## 2022-03-31 VITALS — BP 115/68 | HR 76 | Resp 20 | Ht 59.0 in | Wt 104.7 lb

## 2022-03-31 DIAGNOSIS — M7989 Other specified soft tissue disorders: Secondary | ICD-10-CM | POA: Diagnosis not present

## 2022-03-31 NOTE — Progress Notes (Signed)
   Established Patient Office Visit  Subjective   Patient ID: Tina [name], female   DOB: 1985/08/04 Age: 36 y.o. MRN: [id number]   Chief Complaint  Patient presents with   Leg Swelling   HPI Pleasant 36 year old female presenting today with complaints of left leg pain and swelling.  Notes that over the last couple of weeks, when she showered that the posterior left thigh was somewhat tender.  Yesterday she noted that there was a bruise about the size of a half dollar that was on her left posterior upper thigh.  It was tender to touch so she massaged the area.  This morning she woke up and the area in question was red, swollen, and painful.  She has also developed some swelling and discomfort in the posterior left knee.  She can see some of the veins in that area that are a bit different from the other leg.  No fevers, chills, shortness of breath, chest pain, unusual headaches, sudden vision changes, abdominal pain, or swelling of the right leg.   Objective:    Vitals:   03/31/22 1310  BP: 115/68  Pulse: 76  Resp: 20  Height: 4\' 11"  (1.499 m)  Weight: 104 lb 11.2 oz (47.5 kg)  SpO2: 100%  BMI (Calculated): 21.14   Physical Exam Vitals and nursing note reviewed.  Constitutional:      General: She is not in acute distress.    Appearance: Normal appearance. She is normal weight. She is not ill-appearing.  HENT:     Head: Normocephalic and atraumatic.  Cardiovascular:     Rate and Rhythm: Normal rate and regular rhythm.     Pulses: Normal pulses.     Heart sounds: Normal heart sounds.  Pulmonary:     Effort: Pulmonary effort is normal. No respiratory distress.     Breath sounds: Normal breath sounds. No wheezing, rhonchi or rales.  Musculoskeletal:       Legs:  Skin:    General: Skin is warm and dry.  Neurological:     Mental Status: She is alert and oriented to person, place, and time.  Psychiatric:        Mood and Affect: Mood normal.        Behavior: Behavior  normal.        Thought Content: Thought content normal.        Judgment: Judgment normal.   No results found for this or any previous visit (from the past 24 hour(s)).     The ASCVD Risk score (Arnett DK, et al., 2019) failed to calculate for the following reasons:   The 2019 ASCVD risk score is only valid for ages 5 to 37   Assessment & Plan:   1. Left leg swelling Unclear etiology.  Upper thigh bruising with redness, tenderness, and swelling could be a cutaneous abscess, and irritated sebaceous cyst, or a blood clot.  Swelling of the posterior knee is also suspicious for blood clot versus varicose veins.  Measurements of the legs at the thigh as well as the calf symmetrical and no pitting edema on exam.  We will go ahead and rule out a DVT with a stat ultrasound.  Recommend conservative treatment with heat/ice as tolerated, Tylenol or ibuprofen as needed. - US Venous Img Lower Unilateral Left; Future  Return if symptoms worsen or fail to improve.  ___________________________________________ Clearnce Sorrel, DNP, APRN, FNP-BC [facility]

## 2022-05-02 ENCOUNTER — Ambulatory Visit (INDEPENDENT_AMBULATORY_CARE_PROVIDER_SITE_OTHER): Payer: Medicaid Other | Admitting: Family Medicine

## 2022-05-02 ENCOUNTER — Encounter: Payer: Self-pay | Admitting: Family Medicine

## 2022-05-02 DIAGNOSIS — J329 Chronic sinusitis, unspecified: Secondary | ICD-10-CM

## 2022-05-02 DIAGNOSIS — J40 Bronchitis, not specified as acute or chronic: Secondary | ICD-10-CM | POA: Diagnosis not present

## 2022-05-02 MED ORDER — AMOXICILLIN-POT CLAVULANATE 875-125 MG PO TABS: 1.0000 | ORAL_TABLET | Freq: Two times a day (BID) | ORAL | 0 refills | Status: DC

## 2022-05-02 MED ORDER — PREDNISONE 20 MG PO TABS: 20.0000 mg | ORAL_TABLET | Freq: Two times a day (BID) | ORAL | 0 refills | Status: AC

## 2022-05-02 NOTE — Progress Notes (Signed)
Tina [name] - 36 y.o. female MRN [id number]  Date of birth: 08-04-1985  Subjective Chief Complaint  Patient presents with   Sinusitis    HPI Tina [name] is a 36 y.o. female here today with complaint of possible sinusitis.    She reports that she had URI symptoms about 3 weeks ago.  She had though this would resolve but symptoms have persisted and actually worsened some over the past few days.  She feels like this is affecting her asthma.  She is needing to use her inhaler more frequently. She has  not tried anything else OTC.  She denies fever, chills, nausea, vomiting, diarrhea.   ROS:  A comprehensive ROS was completed and negative except as noted per HPI  Allergies  Allergen Reactions   Banana Swelling    Throat swelling   Cefazolin Swelling    Facial edema (no airway edema) during c-section after ancef. Has not been formally tested for cephalosporin allergy.    Diphenhydramine Hives   Latex Hives and Swelling    Past Medical History:  Diagnosis Date   Allergy    Asthma    Heart murmur     Past Surgical History:  Procedure Laterality Date   BREAST EXCISIONAL BIOPSY Left 2007   CESAREAN SECTION N/A 10/20/2020   Procedure: CESAREAN SECTION;  Surgeon: Cheri Fowler, MD;  Location: [facility] LD [facility];  Service: Obstetrics;  Laterality: N/A;  FHR Decelerations   COLPOSCOPY     LAPAROSCOPY     WISDOM TOOTH EXTRACTION      Social History   Socioeconomic History   Marital status: Single    Spouse name: Not on file   Number of children: Not on file   Years of education: Not on file   Highest education level: Not on file  Occupational History   Not on file  Tobacco Use   Smoking status: Former    Types: E-cigarettes   Smokeless tobacco: Never  Vaping Use   Vaping Use: Never used  Substance and Sexual Activity   Alcohol use: Not Currently    Comment: occ   Drug use: No   Sexual activity: Yes    Birth control/protection: Condom  Other Topics Concern    Not on file  Social History Narrative   Not on file   Social Determinants of Health   Financial Resource Strain: Low Risk  (02/04/2019)   Overall Financial Resource Strain (CARDIA)    Difficulty of Paying Living Expenses: Not hard at all  Food Insecurity: No Food Insecurity (02/04/2019)   Hunger Vital Sign    Worried About Running Out of Food in the Last Year: Never true    [place] in the Last Year: Never true  Transportation Needs: No Transportation Needs (02/04/2019)   PRAPARE - Hydrologist (Medical): No    Lack of Transportation (Non-Medical): No  Physical Activity: Inactive (08/23/2017)   Exercise Vital Sign    Days of Exercise per Week: 0 days    Minutes of Exercise per Session: 0 min  Stress: No Stress Concern Present (08/23/2017)   [place]    Feeling of Stress : Not at all  Social Connections: Somewhat Isolated (08/23/2017)   Social Connection and Isolation Panel [NHANES]    Frequency of Communication with Friends and Family: More than three times a week    Frequency of Social Gatherings with Friends and Family: Once a week  Attends Religious Services: Never    Active Member of Clubs or Organizations: Yes    Attends Banker Meetings: 1 to 4 times per year    Marital Status: Never married    Family History  Problem Relation Age of Onset   Diabetes Mother    Hypertension Father    Asthma Father    Diabetes Father    Cancer Maternal Aunt        colon   Lupus Maternal Aunt    Diabetes Maternal Aunt    Breast cancer Maternal Grandmother    Breast cancer Paternal Grandmother    Diabetes Paternal Grandmother    Hypertension Maternal Aunt    Diabetes Maternal Aunt    Diabetes Maternal Aunt    Allergic rhinitis Sister    Food Allergy Sister    Eczema Sister    Asthma Maternal Uncle    Diabetes Paternal Grandfather     Health Maintenance  Topic Date  Due   PAP SMEAR-Modifier  08/10/2022 (Originally 02/22/2008)   COVID-19 Vaccine (1) 08/10/2022 (Originally 02/22/1992)   INFLUENZA VACCINE  10/08/2022 (Originally 02/07/2022)   TETANUS/TDAP  03/16/2023 (Originally 02/21/2006)   Hepatitis C Screening  Completed   HIV Screening  Completed   HPV VACCINES  Aged Out     ----------------------------------------------------------------------------------------------------------------------------------------------------------------------------------------------------------------- Physical Exam BP 118/75 (BP Location: Left Arm, Patient Position: Sitting, Cuff Size: Small)   Pulse 85   Temp 97.8 F (36.6 C) (Oral)   Ht 4\' 11"  (1.499 m)   Wt 106 lb (48.1 kg)   SpO2 100%   BMI 21.41 kg/m   Physical Exam Constitutional:      Appearance: Normal appearance.  HENT:     Right Ear: Tympanic membrane normal.     Left Ear: Tympanic membrane normal.     Nose:     Comments: Sinus pain with palpation over b/l maxillary sinus.  Eyes:     General: No scleral icterus. Cardiovascular:     Rate and Rhythm: Normal rate and regular rhythm.  Pulmonary:     Effort: Pulmonary effort is normal.     Breath sounds: Normal breath sounds.  Neurological:     Mental Status: She is alert.  Psychiatric:        Mood and Affect: Mood normal.        Behavior: Behavior normal.     ------------------------------------------------------------------------------------------------------------------------------------------------------------------------------------------------------------------- Assessment and Plan  Sinobronchitis Start augmentin and will add steroid as she is having increased asthma symptoms as well. Continue albuterol as needed.  Push fluids and rest.  Contact clinic if having worsening symptoms.    Meds ordered this encounter  Medications   predniSONE (DELTASONE) 20 MG tablet    Sig: Take 1 tablet (20 mg total) by mouth 2 (two) times daily with a  meal for 5 days.    Dispense:  10 tablet    Refill:  0   amoxicillin-clavulanate (AUGMENTIN) 875-125 MG tablet    Sig: Take 1 tablet by mouth 2 (two) times daily.    Dispense:  20 tablet    Refill:  0    No follow-ups on file.    This visit occurred during the SARS-CoV-2 public health emergency.  Safety protocols were in place, including screening questions prior to the visit, additional usage of staff PPE, and extensive cleaning of exam room while observing appropriate contact time as indicated for disinfecting solutions.

## 2022-05-02 NOTE — Patient Instructions (Signed)

## 2022-05-02 NOTE — Assessment & Plan Note (Signed)
Start augmentin and will add steroid as she is having increased asthma symptoms as well. Continue albuterol as needed.  Push fluids and rest.  Contact clinic if having worsening symptoms.

## 2022-05-08 NOTE — Patient Instructions (Incomplete)
Mild persistent asthma Restart Pulmicort Flexihaler 180 mcg 2 puffs twice a day to help prevent cough and wheeze. This is safe to use while breastfeeding. Prescription sent. If your breathing symptoms do not improve after restarting Pulmicort Flexhaler 180 mcg go ahead and start the prednisone given by your primary care physician May use albuterol 2 puffs every 4 hours as needed for cough, wheeze, tightness in chest, or shortness of breath.    Seasonal and perennial allergic rhinitis She wishes to hold off on starting an antihistamine while breast-feeding May use Rhinocort 1-2 sprays each nostril once a day as needed for stuffy.  This medication is okay to use while breast-feeding and is the corticosteroid of choice while breast-feeding.  Gastroesophageal reflux disease Continue dietary and lifestyle modifications Recommended 30 day trial of Pepcid (famotidine) 20 mg once a day to see if this helps with cough. Call us with an update This is ok to use while breastfeeding  Food allergy  Continue to avoid banana. Continue to carry epinephrine auto-injector with you at all times.     Please let us know if this treatment plan is not working well for you Schedule a follow-up appointment in 6 weeks or sooner if needed

## 2022-05-09 ENCOUNTER — Encounter: Payer: Self-pay | Admitting: Family

## 2022-05-09 ENCOUNTER — Telehealth: Payer: Self-pay

## 2022-05-09 ENCOUNTER — Ambulatory Visit (INDEPENDENT_AMBULATORY_CARE_PROVIDER_SITE_OTHER): Payer: Medicaid Other | Admitting: Family

## 2022-05-09 ENCOUNTER — Other Ambulatory Visit (HOSPITAL_COMMUNITY): Payer: Self-pay

## 2022-05-09 VITALS — BP 120/76 | HR 87 | Resp 21

## 2022-05-09 DIAGNOSIS — T7800XD Anaphylactic reaction due to unspecified food, subsequent encounter: Secondary | ICD-10-CM

## 2022-05-09 DIAGNOSIS — J4530 Mild persistent asthma, uncomplicated: Secondary | ICD-10-CM

## 2022-05-09 DIAGNOSIS — K219 Gastro-esophageal reflux disease without esophagitis: Secondary | ICD-10-CM | POA: Diagnosis not present

## 2022-05-09 DIAGNOSIS — R059 Cough, unspecified: Secondary | ICD-10-CM

## 2022-05-09 DIAGNOSIS — J3089 Other allergic rhinitis: Secondary | ICD-10-CM | POA: Diagnosis not present

## 2022-05-09 MED ORDER — ALBUTEROL SULFATE HFA 108 (90 BASE) MCG/ACT IN AERS: INHALATION_SPRAY | RESPIRATORY_TRACT | 1 refills | Status: DC

## 2022-05-09 MED ORDER — PULMICORT FLEXHALER 180 MCG/ACT IN AEPB: INHALATION_SPRAY | RESPIRATORY_TRACT | 5 refills | Status: DC

## 2022-05-09 MED ORDER — FAMOTIDINE 20 MG PO TABS: ORAL_TABLET | ORAL | 0 refills | Status: DC

## 2022-05-09 NOTE — Progress Notes (Signed)
[address] [place] [address] Dept: [phone]  FOLLOW UP NOTE  Patient ID: Tina [name], female    DOB: [birth year]  Age: 36 y.o. MRN: [id number] Date of Office Visit: 05/09/2022  Assessment  Chief Complaint: Asthma and Sinus Problem (Currently dx with a sinus infection. Currently on augmentin.)  HPI Tina [name] is a 36 year old female who presents today for an acute visit of cough.  She was last seen on February 10, 2022 by myself for mild persistent asthma, seasonal and perennial allergic rhinitis, gastroesophageal reflux disease, anaphylactic shock due to food, and cough.  She reports that since her last office visit that she was diagnosed with a sinus infection and is currently taking amoxicillin that she started Friday.  Mild persistent asthma: She reports that her current sinus infection is causing her asthma to flare.  She has not been using her Pulmicort Flexhaler 180 mcg 2 puffs twice a day for at least greater than the past 4 weeks.  She reports that she lost this inhaler.  Prior to that she was only using the Pulmicort Flexhaler as needed rather than as prescribed 2 puffs twice a day.  She reports a productive cough with clear sputum, wheezing, tightness in her chest, and shortness of breath.  She does mention that the coughing, wheezing and tightness in chest have gotten better since starting the amoxicillin but given by her primary care physician.  She also mentions that her primary care physician gave her prescription for prednisone, but she does not like to take it due to the side effects and concerns for her breast-feeding still.  She wanted to get my opinion.  She denies nocturnal awakenings due to breathing problems.  Since her last office visit she has not made any trips to the emergency room or urgent care due to breathing problems.  She has not been on any other steroids since her last office visit.  She has not used her Ventolin inhaler.  Verbally  reeducated when to use Ventolin (albuterol) inhaler.  Her ACT score today is 11.   Seasonal and perennial allergic rhinitis: She is currently taking amoxicillin that she started Friday for a sinus infection.  She reports rhinorrhea that is clear, postnasal drip, and nasal congestion.  She is not currently taking any antihistamines and has been using Rhinocort daily for the past couple weeks.  She did take Motrin a couple times due to her sinus infection.  She reports that the beginning of her symptoms she had a fever, but has not had a fever in 2 weeks.  Gastroesophageal reflux disease.  She reports that she still has a cough after eating.  She mentions that she has not been checked for celiac disease.  Discussed that cough is not the traditional symptom with celiac disease.  She cannot remember if she spoke with her primary care physician about doing a 30-day trial of reflux medicine to see if this helps with the cough.  She is willing to try as long as it will not interfere with her breast-feeding.  She will feel heartburn/ reflux symptoms with pasta and tomato sauces.  She describes as feeling as something sitting there and then if she coughs 10 minutes later it will be better.   Drug Allergies:  Allergies  Allergen Reactions   Banana Swelling    Throat swelling   Cefazolin Swelling    Facial edema (no airway edema) during c-section after ancef. Has not been formally tested for cephalosporin  allergy.    Diphenhydramine Hives   Latex Hives and Swelling    Review of Systems: Review of Systems  Constitutional:  Positive for fever. Negative for chills.       Reports that she had a fever 2 weeks ago, but not since then  HENT:         Reports rhinorrhea that is clear in color, postnasal drip, and nasal congestion.  She mentions that her symptoms are not as bad as they were.  Respiratory:  Positive for cough, shortness of breath and wheezing.        Reports coughing, wheezing, and tightness in  chest that has gotten better.  She also reports shortness of breath.  She denies nocturnal awakenings due to breathing problems  Cardiovascular:  Negative for chest pain and palpitations.  Genitourinary:  Negative for frequency.  Neurological:  Positive for headaches.       Reports diagnosis of migraines.  She is currently not taking medication for migraines due to breast-feeding.  Endo/Heme/Allergies:  Positive for environmental allergies.     Physical Exam: BP 120/76 (BP Location: Right Arm, Patient Position: Sitting, Cuff Size: Small)   Pulse 87   Resp (!) 21   SpO2 98%    Physical Exam Constitutional:      Appearance: Normal appearance.  HENT:     Head: Normocephalic and atraumatic.     Comments: Pharynx normal, eyes normal, ears normal, nose: Bilateral lower turbinates moderately edematous and slightly erythematous with clear drainage noted    Right Ear: Tympanic membrane, ear canal and external ear normal.     Left Ear: Tympanic membrane, ear canal and external ear normal.     Mouth/Throat:     Mouth: Mucous membranes are moist.     Pharynx: Oropharynx is clear.  Eyes:     Conjunctiva/sclera: Conjunctivae normal.  Cardiovascular:     Rate and Rhythm: Normal rate and regular rhythm.     Heart sounds: Normal heart sounds.  Pulmonary:     Effort: Pulmonary effort is normal.     Breath sounds: Normal breath sounds.     Comments: Lungs clear to auscultation Musculoskeletal:     Cervical back: Neck supple.  Skin:    General: Skin is warm.  Neurological:     Mental Status: She is alert and oriented to person, place, and time.  Psychiatric:        Mood and Affect: Mood normal.        Behavior: Behavior normal.        Thought Content: Thought content normal.        Judgment: Judgment normal.     Diagnostics: FVC 2.49 L (90%), FEV1 1.98 L (84%).  Spirometry indicates normal spirometry.  Assessment and Plan: 1. Not well controlled mild persistent asthma   2. Seasonal  and perennial allergic rhinitis   3. Gastroesophageal reflux disease, unspecified whether esophagitis present   4. Cough, unspecified type   5. Anaphylactic shock due to food, subsequent encounter     Meds ordered this encounter  Medications   albuterol (VENTOLIN HFA) 108 (90 Base) MCG/ACT inhaler    Sig: 2 puffs every 4-6 hours as needed for coughing or wheezing spells    Dispense:  18 g    Refill:  1    Please place rx on hold. Patient will call when needed.   budesonide (PULMICORT FLEXHALER) 180 MCG/ACT inhaler    Sig: Inhale 2 puffs twice a day to prevent coughing or wheezing.Rinse  mouth out after to help prevent thrush.    Dispense:  1 each    Refill:  5    Please run as brand name. Should not need PA   famotidine (PEPCID) 20 MG tablet    Sig: Take one tablet 30 minutes before dinner    Dispense:  30 tablet    Refill:  0    Patient Instructions  Mild persistent asthma Restart Pulmicort Flexihaler 180 mcg 2 puffs twice a day to help prevent cough and wheeze. This is safe to use while breastfeeding. Prescription sent. If your breathing symptoms do not improve after restarting Pulmicort Flexhaler 180 mcg go ahead and start the prednisone given by your primary care physician May use albuterol 2 puffs every 4 hours as needed for cough, wheeze, tightness in chest, or shortness of breath.    Seasonal and perennial allergic rhinitis She wishes to hold off on starting an antihistamine while breast-feeding May use Rhinocort 1-2 sprays each nostril once a day as needed for stuffy.  This medication is okay to use while breast-feeding and is the corticosteroid of choice while breast-feeding.  Gastroesophageal reflux disease Continue dietary and lifestyle modifications Recommended 30 day trial of Pepcid (famotidine) 20 mg once a day to see if this helps with cough. Call us with an update This is ok to use while breastfeeding  Food allergy  Continue to avoid banana. Continue to carry  epinephrine auto-injector with you at all times.     Please let us know if this treatment plan is not working well for you Schedule a follow-up appointment in 6 weeks or sooner if needed  Return in about 6 weeks (around 06/20/2022), or if symptoms worsen or fail to improve.    Thank you for the opportunity to care for this patient.  Please do not hesitate to contact me with questions.  Althea Charon, FNP [facility]

## 2022-05-09 NOTE — Telephone Encounter (Signed)
Pt is suppose to be on pulmicort flehaler not flovent

## 2022-05-10 ENCOUNTER — Other Ambulatory Visit (HOSPITAL_COMMUNITY): Payer: Self-pay

## 2022-05-10 NOTE — Telephone Encounter (Signed)
Received notification that prior authorization is required for Pulmicort Flexhaler 180MCG/ACT aerosol powder.  PA submitted and APPROVED on 05-10-2022.  Tina [name] Effective: 05-10-2022 - 05-10-2023

## 2022-05-24 ENCOUNTER — Other Ambulatory Visit: Payer: Self-pay

## 2022-05-24 MED ORDER — FAMOTIDINE 20 MG PO TABS: ORAL_TABLET | ORAL | 0 refills | Status: DC

## 2022-05-30 ENCOUNTER — Encounter: Payer: Self-pay | Admitting: Family Medicine

## 2022-05-30 ENCOUNTER — Ambulatory Visit: Payer: Medicaid Other | Admitting: Family Medicine

## 2022-05-30 VITALS — BP 145/82 | HR 87 | Ht 59.0 in | Wt 109.0 lb

## 2022-05-30 DIAGNOSIS — N926 Irregular menstruation, unspecified: Secondary | ICD-10-CM

## 2022-05-30 DIAGNOSIS — R109 Unspecified abdominal pain: Secondary | ICD-10-CM | POA: Diagnosis not present

## 2022-05-30 DIAGNOSIS — R1084 Generalized abdominal pain: Secondary | ICD-10-CM

## 2022-05-30 LAB — POCT URINALYSIS DIP (CLINITEK)
Bilirubin, UA: NEGATIVE
Glucose, UA: NEGATIVE mg/dL
Ketones, POC UA: NEGATIVE mg/dL
Leukocytes, UA: NEGATIVE
Nitrite, UA: NEGATIVE
POC PROTEIN,UA: NEGATIVE
Spec Grav, UA: 1.02 (ref 1.010–1.025)
Urobilinogen, UA: 0.2 E.U./dL
pH, UA: 7 (ref 5.0–8.0)

## 2022-05-30 LAB — POCT URINE PREGNANCY: Preg Test, Ur: NEGATIVE

## 2022-05-30 NOTE — Progress Notes (Signed)
Tina [name] - 36 y.o. female MRN [id number]  Date of birth: [birth year]  Subjective Chief Complaint  Patient presents with   Abdominal Pain    HPI Tina [name] is a 36 y.o. female here today with complaint of abdominal pain.  She has had abdominal pain for a few days.  Pain has woken her from sleep on occasion.  She has not had any nausea or vomiting.  Bowels have been normal.  She has had some urinary frequency.  Does report that her menstrual cycle has been abnormal.  Cycle has been much lighter than normal and not lasting as long.  She has not had any fever or chills recently.  ROS:  A comprehensive ROS was completed and negative except as noted per HPI  Allergies  Allergen Reactions   Banana Swelling    Throat swelling   Cefazolin Swelling    Facial edema (no airway edema) during c-section after ancef. Has not been formally tested for cephalosporin allergy.    Diphenhydramine Hives   Latex Hives and Swelling    Past Medical History:  Diagnosis Date   Allergy    Asthma    Heart murmur     Past Surgical History:  Procedure Laterality Date   BREAST EXCISIONAL BIOPSY Left 2007   CESAREAN SECTION N/A 10/20/2020   Procedure: CESAREAN SECTION;  Surgeon: Lavina Hamman, MD;  Location: [facility];  Service: Obstetrics;  Laterality: N/A;  FHR Decelerations   COLPOSCOPY     LAPAROSCOPY     WISDOM TOOTH EXTRACTION      Social History   Socioeconomic History   Marital status: Single    Spouse name: Not on file   Number of children: Not on file   Years of education: Not on file   Highest education level: Not on file  Occupational History   Not on file  Tobacco Use   Smoking status: Former    Types: E-cigarettes   Smokeless tobacco: Never  Vaping Use   Vaping Use: Never used  Substance and Sexual Activity   Alcohol use: Not Currently    Comment: occ   Drug use: No   Sexual activity: Yes    Birth control/protection: Condom  Other Topics Concern   Not  on file  Social History Narrative   Not on file   Social Determinants of Health   Financial Resource Strain: Low Risk  (02/04/2019)   Overall Financial Resource Strain (CARDIA)    Difficulty of Paying Living Expenses: Not hard at all  Food Insecurity: No Food Insecurity (02/04/2019)   Hunger Vital Sign    Worried About Running Out of Food in the Last Year: Never true    Ran Out of Food in the Last Year: Never true  Transportation Needs: No Transportation Needs (02/04/2019)   PRAPARE - Administrator, Civil Service (Medical): No    Lack of Transportation (Non-Medical): No  Physical Activity: Inactive (08/23/2017)   Exercise Vital Sign    Days of Exercise per Week: 0 days    Minutes of Exercise per Session: 0 min  Stress: No Stress Concern Present (08/23/2017)   [employer] of Occupational Health - Occupational Stress Questionnaire    Feeling of Stress : Not at all  Social Connections: Somewhat Isolated (08/23/2017)   Social Connection and Isolation Panel [NHANES]    Frequency of Communication with Friends and Family: More than three times a week    Frequency of Social Gatherings with Friends and  Family: Once a week    Attends Religious Services: Never    Active Member of Clubs or Organizations: Yes    Attends Banker Meetings: 1 to 4 times per year    Marital Status: Never married    Family History  Problem Relation Age of Onset   Diabetes Mother    Hypertension Father    Asthma Father    Diabetes Father    Cancer Maternal Aunt        colon   Lupus Maternal Aunt    Diabetes Maternal Aunt    Breast cancer Maternal Grandmother    Breast cancer Paternal Grandmother    Diabetes Paternal Grandmother    Hypertension Maternal Aunt    Diabetes Maternal Aunt    Diabetes Maternal Aunt    Allergic rhinitis Sister    Food Allergy Sister    Eczema Sister    Asthma Maternal Uncle    Diabetes Paternal Grandfather     Health Maintenance  Topic Date Due    PAP SMEAR-Modifier  08/10/2022 (Originally 02/22/2008)   COVID-19 Vaccine (1) 08/10/2022 (Originally 02/22/1992)   INFLUENZA VACCINE  10/08/2022 (Originally 02/07/2022)   Hepatitis C Screening  Completed   HIV Screening  Completed   HPV VACCINES  Aged Out     ----------------------------------------------------------------------------------------------------------------------------------------------------------------------------------------------------------------- Physical Exam BP (!) 145/82 (BP Location: Left Arm, Patient Position: Sitting, Cuff Size: Small)   Pulse 87   Ht 4\' 11"  (1.499 m)   Wt 109 lb (49.4 kg)   SpO2 100%   BMI 22.02 kg/m   Physical Exam Constitutional:      Appearance: She is well-developed.  HENT:     Head: Normocephalic and atraumatic.  Cardiovascular:     Rate and Rhythm: Normal rate and regular rhythm.  Pulmonary:     Effort: Pulmonary effort is normal.     Breath sounds: Normal breath sounds.  Abdominal:     Comments: Abdomen is soft.  NABS.  She does have tenderness palpation along the left side of her abdomen.  Musculoskeletal:     Cervical back: Neck supple.  Neurological:     Mental Status: She is alert.  Psychiatric:        Mood and Affect: Mood normal.        Behavior: Behavior normal.     ------------------------------------------------------------------------------------------------------------------------------------------------------------------------------------------------------------------- Assessment and Plan  Left lateral abdominal pain Urinalysis with some blood in her urine which may be related to her menstrual cycle however given her left-sided pain and limited renal ultrasound to evaluate for possible stone.  I think we should be able to see this given her body habitus.  Urine pregnancy test was negative today.  Checking additional labs including CMP, CBC and TSH given her report of menstrual abnormalities.   No orders of  the defined types were placed in this encounter.   No follow-ups on file.    This visit occurred during the SARS-CoV-2 public health emergency.  Safety protocols were in place, including screening questions prior to the visit, additional usage of staff PPE, and extensive cleaning of exam room while observing appropriate contact time as indicated for disinfecting solutions.

## 2022-05-30 NOTE — Assessment & Plan Note (Signed)
Urinalysis with some blood in her urine which may be related to her menstrual cycle however given her left-sided pain and limited renal ultrasound to evaluate for possible stone.  I think we should be able to see this given her body habitus.  Urine pregnancy test was negative today.  Checking additional labs including CMP, CBC and TSH given her report of menstrual abnormalities.

## 2022-05-31 LAB — CBC WITH DIFFERENTIAL/PLATELET
Absolute Monocytes: 397 cells/uL (ref 200–950)
Basophils Absolute: 31 cells/uL (ref 0–200)
Basophils Relative: 0.5 %
Eosinophils Absolute: 279 cells/uL (ref 15–500)
Eosinophils Relative: 4.5 %
HCT: 31.7 % — ABNORMAL LOW (ref 35.0–45.0)
Hemoglobin: 10.9 g/dL — ABNORMAL LOW (ref 11.7–15.5)
Lymphs Abs: 1823 cells/uL (ref 850–3900)
MCH: 31.5 pg (ref 27.0–33.0)
MCHC: 34.4 g/dL (ref 32.0–36.0)
MCV: 91.6 fL (ref 80.0–100.0)
MPV: 9.3 fL (ref 7.5–12.5)
Monocytes Relative: 6.4 %
Neutro Abs: 3670 cells/uL (ref 1500–7800)
Neutrophils Relative %: 59.2 %
Platelets: 279 10*3/uL (ref 140–400)
RBC: 3.46 10*6/uL — ABNORMAL LOW (ref 3.80–5.10)
RDW: 12.5 % (ref 11.0–15.0)
Total Lymphocyte: 29.4 %
WBC: 6.2 10*3/uL (ref 3.8–10.8)

## 2022-05-31 LAB — TSH: TSH: 3.92 mIU/L

## 2022-05-31 LAB — COMPLETE METABOLIC PANEL WITH GFR
AG Ratio: 1.7 (calc) (ref 1.0–2.5)
ALT: 13 U/L (ref 6–29)
AST: 14 U/L (ref 10–30)
Albumin: 4.6 g/dL (ref 3.6–5.1)
Alkaline phosphatase (APISO): 65 U/L (ref 31–125)
BUN: 7 mg/dL (ref 7–25)
CO2: 28 mmol/L (ref 20–32)
Calcium: 9.2 mg/dL (ref 8.6–10.2)
Chloride: 103 mmol/L (ref 98–110)
Creat: 0.53 mg/dL (ref 0.50–0.97)
Globulin: 2.7 g/dL (calc) (ref 1.9–3.7)
Glucose, Bld: 80 mg/dL (ref 65–99)
Potassium: 3.9 mmol/L (ref 3.5–5.3)
Sodium: 138 mmol/L (ref 135–146)
Total Bilirubin: 0.4 mg/dL (ref 0.2–1.2)
Total Protein: 7.3 g/dL (ref 6.1–8.1)
eGFR: 124 mL/min/{1.73_m2} (ref >=60)

## 2022-06-05 ENCOUNTER — Ambulatory Visit (INDEPENDENT_AMBULATORY_CARE_PROVIDER_SITE_OTHER): Payer: Medicaid Other

## 2022-06-05 DIAGNOSIS — R109 Unspecified abdominal pain: Secondary | ICD-10-CM

## 2022-06-05 DIAGNOSIS — R319 Hematuria, unspecified: Secondary | ICD-10-CM | POA: Diagnosis not present

## 2022-06-06 ENCOUNTER — Telehealth: Payer: Self-pay | Admitting: Family Medicine

## 2022-06-06 ENCOUNTER — Other Ambulatory Visit: Payer: Self-pay | Admitting: Family Medicine

## 2022-06-06 MED ORDER — PANTOPRAZOLE SODIUM 40 MG PO TBEC: 40.0000 mg | DELAYED_RELEASE_TABLET | Freq: Every day | ORAL | 3 refills | Status: DC

## 2022-06-06 NOTE — Telephone Encounter (Signed)
Patient called in regards to lab and ultrasound results she has concerns and would like a call back @ [phone]

## 2022-06-08 ENCOUNTER — Ambulatory Visit: Payer: Medicaid Other | Admitting: Family Medicine

## 2022-06-08 NOTE — Telephone Encounter (Signed)
FYI.Marland KitchenMarland KitchenGot in touch with radiologist who read [place] (Dr. Antonieta Pert).  He  felt that it was more of a technical issue with the imaging and gain on [place] machine was increased too much making kidneys appear more echogenic.  He reviewed and reports that kidney's may have slight echogenicity but overall look ok.  She feels that pain is more reflux related now as she has improvement with eating.  Called in protonix which she will try and keep me posted.  Still has iron deficiency as well.  She will try taking iron with orange juice to help facilitate absorption.

## 2022-06-21 NOTE — Patient Instructions (Incomplete)
Mild persistent asthma Continue Pulmicort Flexihaler 180 mcg 2 puffs twice a day to help prevent cough and wheeze. This is safe to use while breastfeeding. Prescription sent. If your breathing symptoms do not improve after restarting Pulmicort Flexhaler 180 mcg go ahead and start the prednisone given by your primary care physician May use albuterol 2 puffs every 4 hours as needed for cough, wheeze, tightness in chest, or shortness of breath.    Seasonal and perennial allergic rhinitis She wishes to hold off on starting an antihistamine while breast-feeding May use Rhinocort 1-2 sprays each nostril once a day as needed for stuffy.  This medication is okay to use while breast-feeding and is the corticosteroid of choice while breast-feeding.  Gastroesophageal reflux disease Continue dietary and lifestyle modifications Recommended 30 day trial of Pepcid (famotidine) 20 mg once a day to see if this helps with cough. Call us with an update This is ok to use while breastfeeding  Food allergy  Continue to avoid banana. Continue to carry epinephrine auto-injector with you at all times.     Please let us know if this treatment plan is not working well for you Schedule a follow-up appointment in months or sooner if needed

## 2022-06-22 ENCOUNTER — Ambulatory Visit: Payer: Medicaid Other | Admitting: Family

## 2022-06-26 NOTE — Patient Instructions (Incomplete)
Mild persistent asthma Continue Pulmicort Flexihaler 180 mcg 2 puffs twice a day to help prevent cough and wheeze. This is safe to use while breastfeeding. May use albuterol 2 puffs every 4 hours as needed for cough, wheeze, tightness in chest, or shortness of breath.   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  This medication is okay to use while breast-feeding and is the corticosteroid of choice while breast-feeding.  Gastroesophageal reflux disease Continue dietary and lifestyle modifications Continue Protonix as per your primary care physician. Recommend scheduling an appointment with your primary care physician to discuss your abdominal pain/nausea  Food allergy  Continue to avoid banana. Continue to carry epinephrine auto-injector with you at all times.     Please let us know if this treatment plan is not working well for you Schedule a follow-up appointment in 4-6 months or sooner if needed

## 2022-06-27 ENCOUNTER — Other Ambulatory Visit: Payer: Self-pay

## 2022-06-27 ENCOUNTER — Encounter: Payer: Self-pay | Admitting: Family

## 2022-06-27 ENCOUNTER — Ambulatory Visit (INDEPENDENT_AMBULATORY_CARE_PROVIDER_SITE_OTHER): Payer: Medicaid Other | Admitting: Family

## 2022-06-27 VITALS — BP 102/68 | HR 75 | Temp 98.6°F | Resp 18 | Wt 107.1 lb

## 2022-06-27 DIAGNOSIS — J3089 Other allergic rhinitis: Secondary | ICD-10-CM

## 2022-06-27 DIAGNOSIS — J4530 Mild persistent asthma, uncomplicated: Secondary | ICD-10-CM

## 2022-06-27 DIAGNOSIS — K219 Gastro-esophageal reflux disease without esophagitis: Secondary | ICD-10-CM | POA: Diagnosis not present

## 2022-06-27 DIAGNOSIS — T7800XD Anaphylactic reaction due to unspecified food, subsequent encounter: Secondary | ICD-10-CM | POA: Diagnosis not present

## 2022-06-27 NOTE — Progress Notes (Signed)
Tina [name] [id number] Dept: [phone]  FOLLOW UP NOTE  Patient ID: Tina [name], female    DOB: 01-Apr-1986  Age: 36 y.o. 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  She was last seen by myself on May 09, 2022.  She denies any new diagnosis or surgery since her last office visit.  Mild persistent asthma: She denies cough, wheeze, tightness in chest, shortness of breath, and nocturnal awakenings due to breathing problems.  Since her last office she has not required any systemic steroids or made any trips to the emergency room or urgent care due to breathing problems.  She has not had to use her albuterol in a while.  She reports the last time she used it was with a sinus infection.  She continues to take Pulmicort Flexhaler 180 mcg 2 puffs twice a day with spacer and albuterol as needed.  Seasonal and perennial allergic rhinitis: She is currently not taking an antihistamine due to continued breast-feeding.  She has not needed Rhinocort nasal spray.  She reports postnasal drip that is not abnormal.  She denies rhinorrhea and nasal congestion.  She has not had any sinus infections since we last saw her.  Gastroesophageal reflux disease: She reports that the Pepcid 20 mg once a day did not give her relief for her cough after eating.  She did see her primary care physician on May 30, 2022 for abnormal menstrual cycle, generalized abdominal pain, and left lateral abdominal pain.  She was started on Protonix, but mentions that she is not taking it like she should.  She does mention that the abdominal pain that she was having then is getting better she is not having as much  nausea.  She has decided to decrease spicy foods and sauces.     Drug Allergies:  Allergies  Allergen Reactions   Banana Swelling    Throat swelling   Cefazolin Swelling    Facial edema (no airway edema) during c-section after ancef. Has not been formally tested for cephalosporin allergy.    Diphenhydramine Hives   Latex Hives and Swelling    Review of Systems: Review of Systems  Constitutional:  Negative for chills and fever.  HENT:         Reports postnasal drip that is normal for her.  Denies rhinorrhea and nasal congestion  Eyes:        Denies itchy watery eyes  Respiratory:  Negative for cough, shortness of breath and wheezing.        Denies cough, wheeze, tightness in chest, shortness of breath, and nocturnal awakenings due to breathing problems  Cardiovascular:  Negative for chest pain and palpitations.  Gastrointestinal:        Reports heartburn that occurs maybe once a week depending on if she eats spaghetti or spicy foods.  She also reports that she still has abdominal pain and nausea, but not as bad as what it was.  Genitourinary:  Negative for frequency.  Skin:  Negative for itching and rash.  Neurological:  Positive for headaches.       Reports migraines  Endo/Heme/Allergies:  Positive for environmental allergies.     Physical Exam: BP 102/68 (BP Location: Left Arm, Patient Position: Sitting, Cuff  Size: Normal)   Pulse 75   Temp 98.6 F (37 C) (Temporal)   Resp 18   Wt 107 lb 1.6 oz (48.6 kg)   SpO2 100%   BMI 21.63 kg/m    Physical Exam Constitutional:      Appearance: Normal appearance.  HENT:     Head: Normocephalic and atraumatic.     Comments: Pharynx normal, eyes normal, ears normal, nose: Bilateral lower turbinates moderately edematous and slightly erythematous with no drainage noted    Right Ear: Tympanic membrane, ear canal and external ear normal.     Left Ear: Tympanic membrane, ear canal and external ear normal.     Mouth/Throat:      Mouth: Mucous membranes are moist.     Pharynx: Oropharynx is clear.  Eyes:     Conjunctiva/sclera: Conjunctivae normal.  Cardiovascular:     Rate and Rhythm: Normal rate and regular rhythm.     Heart sounds: Normal heart sounds.  Pulmonary:     Effort: Pulmonary effort is normal.     Breath sounds: Normal breath sounds.     Comments: Iungs clear to auscultation Musculoskeletal:     Cervical back: Neck supple.  Skin:    General: Skin is warm.  Neurological:     Mental Status: She is alert and oriented to person, place, and time.  Psychiatric:        Mood and Affect: Mood normal.        Behavior: Behavior normal.        Thought Content: Thought content normal.        Judgment: Judgment normal.     Diagnostics:  None today. Will get at next office visit  Assessment and Plan: 1. Mild persistent asthma without complication   2. Seasonal and perennial allergic rhinitis   3. Gastroesophageal reflux disease, unspecified whether esophagitis present   4. Anaphylactic shock due to food, subsequent encounter     No orders of the defined types were placed in this encounter.   Patient Instructions  Mild persistent asthma Continue Pulmicort Flexihaler 180 mcg 2 puffs twice a day to help prevent cough and wheeze. This is safe to use while breastfeeding. May use albuterol 2 puffs every 4 hours as needed for cough, wheeze, tightness in chest, or shortness of breath.   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  This medication is okay to use while breast-feeding and is the corticosteroid of choice while breast-feeding.  Gastroesophageal reflux disease Continue dietary and lifestyle modifications Continue Protonix as per your primary care physician. Recommend scheduling an appointment with  your primary care physician to discuss your abdominal pain/nausea  Food allergy  Continue to avoid banana. Continue to carry epinephrine auto-injector with you at all times.     Please let us know if this treatment plan is not working well for you Schedule a follow-up appointment in 4-6 months or sooner if needed  Return in about 6 months (around 12/27/2022), or if symptoms worsen or fail to improve.    Thank you for the opportunity to care for this patient.  Please do not hesitate to contact me with questions.  Nehemiah Settle, FNP [facility]

## 2022-08-28 ENCOUNTER — Ambulatory Visit (INDEPENDENT_AMBULATORY_CARE_PROVIDER_SITE_OTHER): Payer: Medicaid Other | Admitting: Family Medicine

## 2022-08-28 ENCOUNTER — Encounter: Payer: Self-pay | Admitting: Family Medicine

## 2022-08-28 VITALS — BP 138/82 | HR 78 | Ht 59.0 in | Wt 108.0 lb

## 2022-08-28 DIAGNOSIS — R519 Headache, unspecified: Secondary | ICD-10-CM

## 2022-08-28 NOTE — Assessment & Plan Note (Signed)
Checking ESR and CRP, although I think temporal arteritis would be less likely in her age group.  If inflammatory markers are normal we may try naproxen with course of steroids as well to prevent rebound.  Instructed to stay well hydrated and limit caffeine.

## 2022-08-28 NOTE — Progress Notes (Signed)
Tina [name] - 37 y.o. female MRN [id number]  Date of birth: December 28, 1985  Subjective Chief Complaint  Patient presents with   Migraine    HPI .Tina [name] is a 37 y.o. female here today with complaint of headache.  Her headache is located on the L side with some pain over the temporal area.  Her symptoms started about 5 days ago.  Symptoms are different from her typical migraine.  She  has tried tylenol and ibuprofen.  She denies nausea/vomiting.  She has not had light sensitivity.  She did see optometrist and was told that everything looks normal.  She has cut back on caffeine for a short time during her menstrual cycle.  She has added this back on without much improvement of the headache.    ROS:  A comprehensive ROS was completed and negative except as noted per HPI  Allergies  Allergen Reactions   Banana Swelling    Throat swelling   Cefazolin Swelling    Facial edema (no airway edema) during c-section after ancef. Has not been formally tested for cephalosporin allergy.    Diphenhydramine Hives   Latex Hives and Swelling    Past Medical History:  Diagnosis Date   Allergy    Asthma    Heart murmur     Past Surgical History:  Procedure Laterality Date   BREAST EXCISIONAL BIOPSY Left 2007   CESAREAN SECTION N/A 10/20/2020   Procedure: CESAREAN SECTION;  Surgeon: Cheri Fowler, MD;  Location: [facility] LD ORS;  Service: Obstetrics;  Laterality: N/A;  FHR Decelerations   COLPOSCOPY     LAPAROSCOPY     WISDOM TOOTH EXTRACTION      Social History   Socioeconomic History   Marital status: Single    Spouse name: Not on file   Number of children: Not on file   Years of education: Not on file   Highest education level: Not on file  Occupational History   Not on file  Tobacco Use   Smoking status: Former    Types: E-cigarettes   Smokeless tobacco: Never  Vaping Use   Vaping Use: Never used  Substance and Sexual Activity   Alcohol use: Not Currently     Comment: occ   Drug use: No   Sexual activity: Yes    Birth control/protection: Condom  Other Topics Concern   Not on file  Social History Narrative   Not on file   Social Determinants of Health   Financial Resource Strain: Low Risk  (02/04/2019)   Overall Financial Resource Strain (CARDIA)    Difficulty of Paying Living Expenses: Not hard at all  Food Insecurity: No Food Insecurity (02/04/2019)   Hunger Vital Sign    Worried About Running Out of Food in the Last Year: Never true    [place] in the Last Year: Never true  Transportation Needs: No Transportation Needs (02/04/2019)   PRAPARE - Hydrologist (Medical): No    Lack of Transportation (Non-Medical): No  Physical Activity: Inactive (08/23/2017)   Exercise Vital Sign    Days of Exercise per Week: 0 days    Minutes of Exercise per Session: 0 min  Stress: No Stress Concern Present (08/23/2017)   [place]    Feeling of Stress : Not at all  Social Connections: Somewhat Isolated (08/23/2017)   Social Connection and Isolation Panel [NHANES]    Frequency of Communication with Friends  and Family: More than three times a week    Frequency of Social Gatherings with Friends and Family: Once a week    Attends Religious Services: Never    Marine scientist or Organizations: Yes    Attends Music therapist: 1 to 4 times per year    Marital Status: Never married    Family History  Problem Relation Age of Onset   Diabetes Mother    Hypertension Father    Asthma Father    Diabetes Father    Cancer Maternal Aunt        colon   Lupus Maternal Aunt    Diabetes Maternal Aunt    Breast cancer Maternal Grandmother    Breast cancer Paternal Grandmother    Diabetes Paternal Grandmother    Hypertension Maternal Aunt    Diabetes Maternal Aunt    Diabetes Maternal Aunt    Allergic rhinitis Sister    Food Allergy Sister     Eczema Sister    Asthma Maternal Uncle    Diabetes Paternal Grandfather     Health Maintenance  Topic Date Due   DTaP/Tdap/Td (1 - Tdap) Never done   INFLUENZA VACCINE  10/08/2022 (Originally 02/07/2022)   PAP SMEAR-Modifier  08/29/2023 (Originally 02/22/2008)   COVID-19 Vaccine (1) 09/13/2023 (Originally 02/22/1992)   Hepatitis C Screening  Completed   HIV Screening  Completed   HPV VACCINES  Aged Out     ----------------------------------------------------------------------------------------------------------------------------------------------------------------------------------------------------------------- Physical Exam BP 138/82 (BP Location: Left Arm, Patient Position: Sitting, Cuff Size: Small)   Pulse 78   Ht 4' 11"$  (1.499 m)   Wt 108 lb (49 kg)   SpO2 100%   BMI 21.81 kg/m   Physical Exam Constitutional:      Appearance: Normal appearance.  HENT:     Head: Normocephalic and atraumatic.     Right Ear: Tympanic membrane normal.     Left Ear: Tympanic membrane normal.     Ears:     Comments: TTP over L temple.     Mouth/Throat:     Comments: No TMJ pain Eyes:     General: No scleral icterus.    Extraocular Movements: Extraocular movements intact.     Pupils: Pupils are equal, round, and reactive to light.  Neurological:     General: No focal deficit present.     Mental Status: She is alert and oriented to person, place, and time.     Cranial Nerves: No cranial nerve deficit.     Motor: No weakness.  Psychiatric:        Mood and Affect: Mood normal.        Behavior: Behavior normal.     ------------------------------------------------------------------------------------------------------------------------------------------------------------------------------------------------------------------- Assessment and Plan  Temporal pain Checking ESR and CRP, although I think temporal arteritis would be less likely in her age group.  If inflammatory markers are  normal we may try naproxen with course of steroids as well to prevent rebound.  Instructed to stay well hydrated and limit caffeine.    No orders of the defined types were placed in this encounter.   No follow-ups on file.    This visit occurred during the SARS-CoV-2 public health emergency.  Safety protocols were in place, including screening questions prior to the visit, additional usage of staff PPE, and extensive cleaning of exam room while observing appropriate contact time as indicated for disinfecting solutions.

## 2022-08-29 ENCOUNTER — Other Ambulatory Visit: Payer: Self-pay | Admitting: Family Medicine

## 2022-08-29 LAB — C-REACTIVE PROTEIN: CRP: <0.2 mg/L (ref <8.0)

## 2022-08-29 LAB — SEDIMENTATION RATE: Sed Rate: 6 mm/h (ref 0–20)

## 2022-08-29 MED ORDER — NAPROXEN 500 MG PO TABS: 500.0000 mg | ORAL_TABLET | Freq: Two times a day (BID) | ORAL | 0 refills | Status: DC

## 2022-08-29 MED ORDER — PREDNISONE 20 MG PO TABS: 20.0000 mg | ORAL_TABLET | Freq: Two times a day (BID) | ORAL | 0 refills | Status: AC

## 2022-11-22 NOTE — Patient Instructions (Incomplete)
Mild persistent asthma Continue Pulmicort Flexihaler 180 mcg 2 puffs twice a day to help prevent cough and wheeze. This is safe to use while breastfeeding. May use albuterol 2 puffs every 4 hours as needed for cough, wheeze, tightness in chest, or shortness of breath.  This is safe to use while pregnant or breastfeeding  Seasonal and perennial allergic rhinitis May use an over-the-counter any histamine such as Zyrtec (cetirizine), Claritin (loratadine), Xyzal (levocetirizine), or Allegra (fexofenadine) once a day as needed for runny nose May use Rhinocort 1-2 sprays each nostril once a day as needed for stuffy.  This medication is okay to use while breast-feeding and is the corticosteroid of choice while breast-feeding.  Gastroesophageal reflux disease Continue dietary and lifestyle modifications Continue Protonix as per your primary care physician.   Food allergy  Continue to avoid banana. Continue to carry epinephrine auto-injector with you at all times.     Please let us know if this treatment plan is not working well for you Schedule a follow-up appointment in 6 months or sooner if needed

## 2022-11-23 ENCOUNTER — Encounter: Payer: Self-pay | Admitting: Family

## 2022-11-23 ENCOUNTER — Ambulatory Visit (INDEPENDENT_AMBULATORY_CARE_PROVIDER_SITE_OTHER): Payer: Medicaid Other | Admitting: Family

## 2022-11-23 ENCOUNTER — Other Ambulatory Visit: Payer: Self-pay

## 2022-11-23 VITALS — BP 128/60 | HR 98 | Temp 98.0°F | Resp 20 | Ht 60.0 in | Wt 105.0 lb

## 2022-11-23 DIAGNOSIS — J3089 Other allergic rhinitis: Secondary | ICD-10-CM | POA: Diagnosis not present

## 2022-11-23 DIAGNOSIS — J4530 Mild persistent asthma, uncomplicated: Secondary | ICD-10-CM

## 2022-11-23 DIAGNOSIS — K219 Gastro-esophageal reflux disease without esophagitis: Secondary | ICD-10-CM | POA: Diagnosis not present

## 2022-11-23 DIAGNOSIS — T7800XD Anaphylactic reaction due to unspecified food, subsequent encounter: Secondary | ICD-10-CM

## 2022-11-23 MED ORDER — PULMICORT FLEXHALER 180 MCG/ACT IN AEPB: INHALATION_SPRAY | RESPIRATORY_TRACT | 5 refills | Status: DC

## 2022-11-23 MED ORDER — BUDESONIDE 32 MCG/ACT NA SUSP: NASAL | 5 refills | Status: DC

## 2022-11-23 MED ORDER — EPINEPHRINE 0.3 MG/0.3ML IJ SOAJ: INTRAMUSCULAR | 1 refills | Status: DC

## 2022-11-23 NOTE — Progress Notes (Signed)
[address] [place] [address] Dept: [phone]  FOLLOW UP NOTE  Patient ID: Tina [name], female    DOB: [birth year]  Age: 37 y.o. MRN: [id number] Date of Office Visit: 11/23/2022  Assessment  Chief Complaint: No chief complaint on file.  HPI Tina [name] is a 37 year old female who presents today for follow-up of mild persistent asthma without complication, seasonal and perennial allergic rhinitis, gastroesophageal reflux disease, and anaphylactic shock due to food.  She was last seen on June 27, 2022 by myself.  She denies any new diagnosis or surgery since her last office visit.  Mild persistent asthma: She continues to take Pulmicort Flexhaler 180 mcg 2 puffs twice a day.  She reports that she did not realize that she could use albuterol while pregnant and breast-feeding.  Discussed that it is safe to use albuterol while pregnant and breast-feeding.  She verbalizes understanding.  She reports shortness of breath when anxious or exercising.  She denies cough, wheeze, tightness in chest, and nocturnal awakenings due to breathing problems.  Since his last office visit she has not required any systemic steroids or made any trips to the emergency room or urgent care due to breathing problems.  Seasonal and perennial allergic rhinitis: She reports that she is trying to wean from breast-feeding so she is contemplating adding back an antihistamine if needed.  She thinks she took Claritin D in the past.  Discussed how decongestants are not recommended for daily use.  She denies rhinorrhea, nasal congestion, postnasal drip.  She has not been treated for any sinus infections since we last saw her.  She reports that she has not needed Rhinocort nasal spray since we last saw her.  Gastroesophageal reflux disease.  She continues to take Protonix as prescribed by her primary care physician.  She does report heartburn with certain foods such as dairy or spicy foods.  Food  allergy: She continues to avoid banana without any accidental ingestion or use of her epinephrine autoinjector device.   Drug Allergies:  Allergies  Allergen Reactions   Banana Swelling    Throat swelling   Cefazolin Swelling    Facial edema (no airway edema) during c-section after ancef. Has not been formally tested for cephalosporin allergy.    Diphenhydramine Hives   Latex Hives and Swelling    Review of Systems: Review of Systems  Constitutional:  Negative for chills and fever.  HENT:         Denies rhinorrhea, nasal congestion, and postnasal drip.  Eyes:        Denies itchy watery eyes  Respiratory:  Positive for shortness of breath. Negative for cough and wheezing.        Reports shortness of breath when anxious or exercising.  Denies cough, wheeze, tightness in chest and nocturnal awakenings due to breathing problems  Cardiovascular:  Negative for chest pain and palpitations.  Gastrointestinal:  Positive for heartburn.       Reports heartburn symptoms only when she eats something that she should not like dairy or spicy foods.  She takes Protonix as prescribed by her primary care physician.  Skin:  Negative for itching and rash.  Neurological:  Negative for headaches.  Endo/Heme/Allergies:  Negative for environmental allergies.     Physical Exam: BP 128/60 (BP Location: Left Arm, Patient Position: Sitting, Cuff Size: Normal)   Pulse 98   Temp 98 F (36.7 C) (Temporal)   Resp 20   Ht 5' (1.524 m)  Wt 105 lb (47.6 kg)   SpO2 100%   BMI 20.51 kg/m    Physical Exam Constitutional:      Appearance: Normal appearance.  HENT:     Head: Normocephalic and atraumatic.     Comments: Pharynx normal, eyes normal, ears normal, nose: Bilateral lower turbinates mildly edematous and slightly erythematous with no drainage noted    Right Ear: Tympanic membrane, ear canal and external ear normal.     Left Ear: Tympanic membrane, ear canal and external ear normal.      Mouth/Throat:     Mouth: Mucous membranes are moist.     Pharynx: Oropharynx is clear.  Eyes:     Conjunctiva/sclera: Conjunctivae normal.  Cardiovascular:     Rate and Rhythm: Regular rhythm.     Heart sounds: Normal heart sounds.  Pulmonary:     Effort: Pulmonary effort is normal.     Breath sounds: Normal breath sounds.     Comments: Lungs clear to auscultation Musculoskeletal:     Cervical back: Neck supple.  Skin:    General: Skin is warm.  Neurological:     Mental Status: She is alert and oriented to person, place, and time.  Psychiatric:        Mood and Affect: Mood normal.        Behavior: Behavior normal.        Thought Content: Thought content normal.        Judgment: Judgment normal.     Diagnostics: FVC 2.27 L (82%), FEV1 1.84 L (79%).  Predicted FVC 2.77 L, predicted FEV1 2.34 L.  Spirometry indicates normal respiratory function.  Assessment and Plan: 1. Mild persistent asthma without complication   2. Seasonal and perennial allergic rhinitis   3. Gastroesophageal reflux disease, unspecified whether esophagitis present   4. Anaphylactic shock due to food, subsequent encounter     Meds ordered this encounter  Medications   budesonide (PULMICORT FLEXHALER) 180 MCG/ACT inhaler    Sig: Inhale 2 puffs twice a day to prevent coughing or wheezing.Rinse mouth out after to help prevent thrush.    Dispense:  1 each    Refill:  5    Please run as brand name. Should not need PA   budesonide (RHINOCORT AQUA) 32 MCG/ACT nasal spray    Sig: Use 2 sprays in each nostril once a day as needed for stuffy nose.    Dispense:  5 mL    Refill:  5   EPINEPHrine (EPIPEN 2-PAK) 0.3 mg/0.3 mL IJ SOAJ injection    Sig: Use as directed for severe allergic reactions    Dispense:  2 each    Refill:  1    Dispense [employer] generic only    Patient Instructions  Mild persistent asthma Continue Pulmicort Flexihaler 180 mcg 2 puffs twice a day to help prevent cough and wheeze. This is  safe to use while breastfeeding. May use albuterol 2 puffs every 4 hours as needed for cough, wheeze, tightness in chest, or shortness of breath.  This is safe to use while pregnant or breastfeeding  Seasonal and perennial allergic rhinitis May use an over-the-counter any histamine such as Zyrtec (cetirizine), Claritin (loratadine), Xyzal (levocetirizine), or Allegra (fexofenadine) once a day as needed for runny nose May use Rhinocort 1-2 sprays each nostril once a day as needed for stuffy.  This medication is okay to use while breast-feeding and is the corticosteroid of choice while breast-feeding.  Gastroesophageal reflux disease Continue dietary and lifestyle modifications Continue  Protonix as per your primary care physician.   Food allergy  Continue to avoid banana. Continue to carry epinephrine auto-injector with you at all times.     Please let us know if this treatment plan is not working well for you Schedule a follow-up appointment in 6 months or sooner if needed  Return in about 6 months (around 05/26/2023), or if symptoms worsen or fail to improve.    Thank you for the opportunity to care for this patient.  Please do not hesitate to contact me with questions.  Nehemiah Settle, FNP [facility]

## 2022-12-18 ENCOUNTER — Encounter (HOSPITAL_BASED_OUTPATIENT_CLINIC_OR_DEPARTMENT_OTHER): Payer: Self-pay | Admitting: Emergency Medicine

## 2022-12-18 ENCOUNTER — Emergency Department (HOSPITAL_BASED_OUTPATIENT_CLINIC_OR_DEPARTMENT_OTHER)
Admission: EM | Admit: 2022-12-18 | Discharge: 2022-12-18 | Disposition: A | Discharge status: Home / Self Care | Payer: Medicaid Other | Attending: Emergency Medicine | Admitting: Emergency Medicine

## 2022-12-18 ENCOUNTER — Emergency Department (HOSPITAL_BASED_OUTPATIENT_CLINIC_OR_DEPARTMENT_OTHER): Payer: Medicaid Other

## 2022-12-18 ENCOUNTER — Ambulatory Visit: Payer: Self-pay

## 2022-12-18 ENCOUNTER — Other Ambulatory Visit: Payer: Self-pay

## 2022-12-18 DIAGNOSIS — Z91040 Latex allergy status: Secondary | ICD-10-CM | POA: Insufficient documentation

## 2022-12-18 DIAGNOSIS — R002 Palpitations: Secondary | ICD-10-CM | POA: Diagnosis not present

## 2022-12-18 LAB — BASIC METABOLIC PANEL
Anion gap: 8 (ref 5–15)
BUN: 12 mg/dL (ref 6–20)
CO2: 25 mmol/L (ref 22–32)
Calcium: 9.2 mg/dL (ref 8.9–10.3)
Chloride: 105 mmol/L (ref 98–111)
Creatinine, Ser: 0.62 mg/dL (ref 0.44–1.00)
GFR, Estimated: >60 mL/min (ref >60)
Glucose, Bld: 94 mg/dL (ref 70–99)
Potassium: 3.9 mmol/L (ref 3.5–5.1)
Sodium: 138 mmol/L (ref 135–145)

## 2022-12-18 LAB — CBC
HCT: 34.8 % — ABNORMAL LOW (ref 36.0–46.0)
Hemoglobin: 11.4 g/dL — ABNORMAL LOW (ref 12.0–15.0)
MCH: 30.6 pg (ref 26.0–34.0)
MCHC: 32.8 g/dL (ref 30.0–36.0)
MCV: 93.3 fL (ref 80.0–100.0)
Platelets: 327 10*3/uL (ref 150–400)
RBC: 3.73 MIL/uL — ABNORMAL LOW (ref 3.87–5.11)
RDW: 12 % (ref 11.5–15.5)
WBC: 6.1 10*3/uL (ref 4.0–10.5)
nRBC: 0 % (ref 0.0–0.2)

## 2022-12-18 LAB — TROPONIN I (HIGH SENSITIVITY): Troponin I (High Sensitivity): <2 ng/L (ref <18)

## 2022-12-18 NOTE — ED Notes (Signed)
EDP said to wait on monitor placement and to wait on IV placement until he sees the troponin  Pt. In no distress and is feeling normal at this time with no c/o palpitations.

## 2022-12-18 NOTE — Discharge Instructions (Addendum)
Workup today is normal.  Follow-up with primary care doctor.

## 2022-12-18 NOTE — ED Provider Triage Note (Signed)
Emergency Medicine Provider Triage Evaluation Note  Tina [name] , a 37 y.o. female  was evaluated in triage.  Pt complains of palpitations. 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  Symptoms are intermittent, lasting 15 min at a time.  Reports that similar symptoms for the past 2 years since delivery of her child, also notes her blood pressure has been elevated since that time as well.  Does follow-up with her PCP regularly, feels like she has probably had a TSH checked sometime in the past 2 years although not certain.  Also notes history of heart murmur, states she had an echo 2 years ago while pregnant.  Review of Systems  Positive: Palpitations Negative: Shortness of breath  Physical Exam  BP (!) 140/96 (BP Location: Left Arm)   Pulse 93   Temp 98.2 F (36.8 C)   Resp 18   Ht 4\' 11"  (1.499 m)   Wt 47.2 kg   LMP 12/01/2022   SpO2 100%   BMI 21.01 kg/m  Gen:   Awake, no distress   Resp:  Normal effort  MSK:   Moves extremities without difficulty  Other:  + murmur, lungs CTA  Medical Decision Making  Medically screening exam initiated at 6:57 PM.  Appropriate orders placed.  Debria Broecker was informed that the remainder of the evaluation will be completed by another provider, this initial triage assessment does not replace that evaluation, and the importance of remaining in the ED until their evaluation is complete.     Jeannie Fend, PA-C 12/18/22 1900

## 2022-12-18 NOTE — ED Provider Notes (Signed)
[place] [facility] Provider Note   CSN: [id number] Arrival date & time: 12/18/22  1843     History  Chief Complaint  Patient presents with   Palpitations    Tina [name] is a 37 y.o. female.  Patient here after episode of palpitations.  Started around 8 hours ago.  Now resolved.  Lasted may be a few minutes.  History of maybe similar symptoms in the past.  Does drink caffeine.  No known medical history or problems.  History of heart murmur.  Does not think she has had any thyroid issues.  Denies any chest pain or shortness of breath.  She is not on any birth control.  Is asymptomatic now.  She has had no recent surgery or travel.  No history of blood clots.  The history is provided by the patient.       Home Medications Prior to Admission medications   Medication Sig Start Date End Date Taking? Authorizing Provider  albuterol (VENTOLIN HFA) 108 (90 Base) MCG/ACT inhaler 2 puffs every 4-6 hours as needed for coughing or wheezing spells 05/09/22   Nehemiah Settle, FNP  budesonide (PULMICORT FLEXHALER) 180 MCG/ACT inhaler Inhale 2 puffs twice a day to prevent coughing or wheezing.Rinse mouth out after to help prevent thrush. 11/23/22   Nehemiah Settle, FNP  budesonide (RHINOCORT AQUA) 32 MCG/ACT nasal spray Use 2 sprays in each nostril once a day as needed for stuffy nose. 11/23/22   Nehemiah Settle, FNP  EPINEPHrine (EPIPEN 2-PAK) 0.3 mg/0.3 mL IJ SOAJ injection Use as directed for severe allergic reactions 11/23/22   Nehemiah Settle, FNP  famotidine (PEPCID) 20 MG tablet Take one tablet 30 minutes before dinner 05/24/22   Nehemiah Settle, FNP  naproxen (NAPROSYN) 500 MG tablet Take 1 tablet (500 mg total) by mouth 2 (two) times daily with a meal. 08/29/22   Everrett Coombe, DO  pantoprazole (PROTONIX) 40 MG tablet Take 1 tablet (40 mg total) by mouth daily. 06/06/22   Everrett Coombe, DO  Spacer/Aero-Holding [place] DEVI 01 spacer 12/09/21    Nehemiah Settle, FNP      Allergies    Banana, Cefazolin, Diphenhydramine, and Latex    Review of Systems   Review of Systems  Physical Exam Updated Vital Signs BP (!) 140/96 (BP Location: Left Arm)   Pulse 93   Temp 98.2 F (36.8 C)   Resp 18   Ht 4\' 11"  (1.499 m)   Wt 47.2 kg   LMP 12/01/2022   SpO2 100%   BMI 21.01 kg/m  Physical Exam Vitals and nursing note reviewed.  Constitutional:      General: She is not in acute distress.    Appearance: She is well-developed. She is not ill-appearing.  HENT:     Head: Normocephalic and atraumatic.     Nose: Nose normal.     Mouth/Throat:     Mouth: Mucous membranes are moist.  Eyes:     Extraocular Movements: Extraocular movements intact.     Conjunctiva/sclera: Conjunctivae normal.     Pupils: Pupils are equal, round, and reactive to light.  Cardiovascular:     Rate and Rhythm: Normal rate and regular rhythm.     Pulses: Normal pulses.     Heart sounds: Murmur heard.  Pulmonary:     Effort: Pulmonary effort is normal. No respiratory distress.     Breath sounds: Normal breath sounds.  Abdominal:     Palpations: Abdomen is soft.  Tenderness: There is no abdominal tenderness.  Musculoskeletal:        General: No swelling. Normal range of motion.     Cervical back: Normal range of motion and neck supple.  Skin:    General: Skin is warm and dry.     Capillary Refill: Capillary refill takes less than 2 seconds.  Neurological:     General: No focal deficit present.     Mental Status: She is alert.     Comments: 5+ out of 5 strength throughout, normal sensation, no drift, normal speech  Psychiatric:        Mood and Affect: Mood normal.     ED Results / Procedures / Treatments   Labs (all labs ordered are listed, but only abnormal results are displayed) Labs Reviewed  CBC - Abnormal; Notable for the following components:      Result Value   RBC 3.73 (*)    Hemoglobin 11.4 (*)    HCT 34.8 (*)    All other  components within normal limits  BASIC METABOLIC PANEL  TROPONIN I (HIGH SENSITIVITY)    EKG EKG Interpretation  Date/Time:  Monday December 18 2022 18:51:55 EDT Ventricular Rate:  84 PR Interval:  131 QRS Duration: 78 QT Interval:  370 QTC Calculation: 438 R Axis:   75 Text Interpretation: Sinus rhythm Confirmed by Virgina Norfolk ([id number]) on 12/18/2022 6:59:19 PM  Radiology DG Chest 2 View  Result Date: 12/18/2022 CLINICAL DATA:  Palpitations EXAM: CHEST - 2 VIEW COMPARISON:  Chest x-ray dated July 29, 2019 FINDINGS: The heart size and mediastinal contours are within normal limits. Both lungs are clear. The visualized skeletal structures are unremarkable. IMPRESSION: No active cardiopulmonary disease. Electronically Signed   By: Allegra Lai M.D.   On: 12/18/2022 19:28    Procedures Procedures    Medications Ordered in ED Medications - No data to display  ED Course/ Medical Decision Making/ A&P                             Medical Decision Making  [name] is here with palpitations.  No significant medical history.  Normal vitals.  No fever.  EKG shows sinus rhythm per my review and interpretation.  Overall she is asymptomatic.  Well-appearing.  Differential diagnosis likely PACs or PVCs or may be stress related process.  Basic labs were obtained prior to my evaluation including CBC and BMP and troponin and are unremarkable.  Overall have no concern for PE as patient is PERC negative.  Has no cardiac risk factors and doubt ACS.  Recommend eliminating caffeine and decreasing caffeine intake.  If still having symptoms should follow-up with primary care doctor for further blood test including thyroid studies and may be cardiac monitoring long-term.  However at this time she is safe for discharge.  Discharged in good condition  This chart was dictated using voice recognition software.  Despite best efforts to proofread,  errors can occur which can change the documentation  meaning.         Final Clinical Impression(s) / ED Diagnoses Final diagnoses:  Palpitations    Rx / DC Orders ED Discharge Orders     None         Virgina Norfolk, DO 12/18/22 2007

## 2022-12-18 NOTE — ED Triage Notes (Signed)
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 Reports discomfort under left breast. States she has had issues with her BP since having her baby about 2 years ago.

## 2022-12-18 NOTE — Telephone Encounter (Signed)
  Chief Complaint: Heart "fluttering" all day Symptoms: Fluttering feeling Frequency: Today Pertinent Negatives: Patient denies chest pain, BOB Disposition: [x] ED /[] Urgent Care (no appt availability in office) / [] Appointment(In office/virtual)/ []  [facility]/ [] Home Care/ [] Refused Recommended Disposition /[] [place]/ []  Follow-up with PCP Additional Notes: PT states that she has a hx of heart murmur. Pt states that all day today she has had a "fluttering" feeling in her chest. Pt wil go to ED for care.  Reason for Disposition  [1] Heart beating very rapidly (e.g., > 140 / minute) AND [2] present now  (Exception: During exercise.)  Answer Assessment - Initial Assessment Questions 1. DESCRIPTION: "Please describe your heart rate or heartbeat that you are having" (e.g., fast/slow, regular/irregular, skipped or extra beats, "palpitations")     Palpitations 2. ONSET: "When did it start?" (Minutes, hours or days)      All day 3. DURATION: "How long does it last" (e.g., seconds, minutes, hours)     All day 4. PATTERN "Does it come and go, or has it been constant since it started?"  "Does it get worse with exertion?"   "Are you feeling it now?"     Constant  - all day RT RATE: "Can you tell me your heart rate?" "How many beats in 15 seconds?"  (Note: not all patients can do this)       BPM 98 7. RECURRENT SYMPTOM: "Have you ever had this before?" If Yes, ask: "When was the last time?" and "What happened that time?"      Yes - EKG and tell her it is fine 8. CAUSE: "What do you think is causing the palpitations?"     Unsure 9. CARDIAC HISTORY: "Do you have any history of heart disease?" (e.g., heart attack, angina, bypass surgery, angioplasty, arrhythmia)      Murmur 10. OTHER SYMPTOMS: "Do you have any other symptoms?" (e.g., dizziness, chest pain, sweating, difficulty breathing)       none  Protocols used: Heart Rate and Heartbeat Questions-A-AH

## 2022-12-19 ENCOUNTER — Telehealth: Payer: Self-pay | Admitting: General Practice

## 2022-12-19 NOTE — Transitions of Care (Post Inpatient/ED Visit) (Signed)
   12/19/2022  Name: Esmee Fallaw MRN: [id number] DOB: 1986-03-06  Today's TOC FU Call Status: Today's TOC FU Call Status:: Successful TOC FU Call Competed TOC FU Call Complete Date: 12/19/22  Transition Care Management Follow-up Telephone Call Date of Discharge: 12/18/22 Discharge Facility: [facility] Type of Discharge: Emergency Department Reason for ED Visit: Cardiac Conditions Cardiac Conditions Diagnosis:  (Palpitations) How have you been since you were released from the hospital?: Same Any questions or concerns?: No  Items Reviewed: Did you receive and understand the discharge instructions provided?: Yes Medications obtained,verified, and reconciled?: Yes (Medications Reviewed) Any new allergies since your discharge?: No Dietary orders reviewed?: NA Do you have support at home?: Yes  Medications Reviewed Today: Medications Reviewed Today     Reviewed by Modesto Charon, RN (Registered Nurse) on 12/19/22 at 1148  Med List Status: <None>   Medication Order Taking? Sig Documenting Provider Last Dose Status Informant  albuterol (VENTOLIN HFA) 108 (90 Base) MCG/ACT inhaler [id number] No 2 puffs every 4-6 hours as needed for coughing or wheezing spells Nehemiah Settle, FNP Taking Active   budesonide (PULMICORT FLEXHALER) 180 MCG/ACT inhaler [id number]  Inhale 2 puffs twice a day to prevent coughing or wheezing.Rinse mouth out after to help prevent thrush. Nehemiah Settle, FNP  Active   budesonide (RHINOCORT AQUA) 32 MCG/ACT nasal spray [id number]  Use 2 sprays in each nostril once a day as needed for stuffy nose. Nehemiah Settle, FNP  Active   EPINEPHrine (EPIPEN 2-PAK) 0.3 mg/0.3 mL IJ SOAJ injection [id number]  Use as directed for severe allergic reactions Nehemiah Settle, FNP  Active   famotidine (PEPCID) 20 MG tablet [id number] No Take one tablet 30 minutes before dinner Nehemiah Settle, FNP Taking Active   naproxen (NAPROSYN) 500 MG tablet [id number] No Take 1 tablet  (500 mg total) by mouth 2 (two) times daily with a meal. Everrett Coombe, DO Taking Active   pantoprazole (PROTONIX) 40 MG tablet [id number] No Take 1 tablet (40 mg total) by mouth daily. Everrett Coombe, DO Taking Active   Spacer/Aero-Holding [place] DEVI [id number] No 01 spacer Nehemiah Settle, FNP Taking Active             Home Care and Equipment/Supplies: Were Home Health Services Ordered?: NA Any new equipment or medical supplies ordered?: NA  Functional Questionnaire: Do you need assistance with bathing/showering or dressing?: No Do you need assistance with meal preparation?: No Do you need assistance with eating?: No Do you have difficulty maintaining continence: No Do you need assistance with getting out of bed/getting out of a chair/moving?: No Do you have difficulty managing or taking your medications?: No  Follow up appointments reviewed: PCP Follow-up appointment confirmed?: Yes Date of PCP follow-up appointment?: 12/21/22 Follow-up Provider: Dr. Ashley Royalty Specialist [facility] Follow-up appointment confirmed?: NA Do you need transportation to your follow-up appointment?: No Do you understand care options if your condition(s) worsen?: Yes-patient verbalized understanding    SIGNATURE Modesto Charon, RN BSN Nurse Health Advisor

## 2022-12-21 ENCOUNTER — Ambulatory Visit: Payer: Medicaid Other | Attending: Family Medicine

## 2022-12-21 ENCOUNTER — Ambulatory Visit (INDEPENDENT_AMBULATORY_CARE_PROVIDER_SITE_OTHER): Payer: Medicaid Other | Admitting: Family Medicine

## 2022-12-21 ENCOUNTER — Encounter: Payer: Self-pay | Admitting: Family Medicine

## 2022-12-21 VITALS — BP 111/66 | HR 87 | Ht 59.0 in | Wt 104.0 lb

## 2022-12-21 DIAGNOSIS — R002 Palpitations: Secondary | ICD-10-CM

## 2022-12-21 DIAGNOSIS — D649 Anemia, unspecified: Secondary | ICD-10-CM

## 2022-12-21 NOTE — Assessment & Plan Note (Signed)
Possibly related to her anemia.  Checking ferritin, folate and B12.  Zio patch ordered as well.

## 2022-12-21 NOTE — Progress Notes (Signed)
Tina [name] - 37 y.o. female MRN [id number]  Date of birth: February 15, 1986  Subjective Chief Complaint  Patient presents with   Hospitalization Follow-up    HPI [name] is a 37 y.o. female here today for follow up of recent hospital visit.  She was seen in ED with complaint of palpitations.  Labs and notes reviewed from ED visit.  EKG w/ NSR, no electrolyte abnormalities, mild anemia noted.  She does have history of iron deficiency.  No excess caffeine use.  Denies dizziness, chest pain or dyspnea.  TSH WNL a few months ago.  No new goiter.  Some stress but doesn't feel too stressed.   ROS:  A comprehensive ROS was completed and negative except as noted per HPI  Allergies  Allergen Reactions   Banana Swelling    Throat swelling   Cefazolin Swelling    Facial edema (no airway edema) during c-section after ancef. Has not been formally tested for cephalosporin allergy.    Diphenhydramine Hives   Latex Hives and Swelling    Past Medical History:  Diagnosis Date   Allergy    Asthma    Heart murmur     Past Surgical History:  Procedure Laterality Date   BREAST EXCISIONAL BIOPSY Left 2007   CESAREAN SECTION N/A 10/20/2020   Procedure: CESAREAN SECTION;  Surgeon: Lavina Hamman, MD;  Location: [facility] LD ORS;  Service: Obstetrics;  Laterality: N/A;  FHR Decelerations   COLPOSCOPY     LAPAROSCOPY     WISDOM TOOTH EXTRACTION      Social History   Socioeconomic History   Marital status: Single    Spouse name: Not on file   Number of children: Not on file   Years of education: Not on file   Highest education level: Not on file  Occupational History   Not on file  Tobacco Use   Smoking status: Former    Types: E-cigarettes   Smokeless tobacco: Never  Vaping Use   Vaping Use: Never used  Substance and Sexual Activity   Alcohol use: Not Currently    Comment: occ   Drug use: No   Sexual activity: Yes    Birth control/protection: Condom  Other Topics Concern    Not on file  Social History Narrative   Not on file   Social Determinants of Health   Financial Resource Strain: Low Risk  (02/04/2019)   Overall Financial Resource Strain (CARDIA)    Difficulty of Paying Living Expenses: Not hard at all  Food Insecurity: No Food Insecurity (02/04/2019)   Hunger Vital Sign    Worried About Running Out of Food in the Last Year: Never true    Ran Out of Food in the Last Year: Never true  Transportation Needs: No Transportation Needs (02/04/2019)   PRAPARE - Administrator, Civil Service (Medical): No    Lack of Transportation (Non-Medical): No  Physical Activity: Inactive (08/23/2017)   Exercise Vital Sign    Days of Exercise per Week: 0 days    Minutes of Exercise per Session: 0 min  Stress: No Stress Concern Present (08/23/2017)   [employer] of Occupational Health - Occupational Stress Questionnaire    Feeling of Stress : Not at all  Social Connections: Somewhat Isolated (08/23/2017)   Social Connection and Isolation Panel [NHANES]    Frequency of Communication with Friends and Family: More than three times a week    Frequency of Social Gatherings with Friends and Family: Once a  week    Attends Religious Services: Never    Active Member of Clubs or Organizations: Yes    Attends Banker Meetings: 1 to 4 times per year    Marital Status: Never married    Family History  Problem Relation Age of Onset   Diabetes Mother    Hypertension Father    Asthma Father    Diabetes Father    Cancer Maternal Aunt        colon   Lupus Maternal Aunt    Diabetes Maternal Aunt    Breast cancer Maternal Grandmother    Breast cancer Paternal Grandmother    Diabetes Paternal Grandmother    Hypertension Maternal Aunt    Diabetes Maternal Aunt    Diabetes Maternal Aunt    Allergic rhinitis Sister    Food Allergy Sister    Eczema Sister    Asthma Maternal Uncle    Diabetes Paternal Grandfather     Health Maintenance  Topic  Date Due   DTaP/Tdap/Td (1 - Tdap) Never done   PAP SMEAR-Modifier  08/29/2023 (Originally 02/22/2008)   COVID-19 Vaccine (1) 09/13/2023 (Originally 02/22/1992)   INFLUENZA VACCINE  02/08/2023   Hepatitis C Screening  Completed   HIV Screening  Completed   HPV VACCINES  Aged Out     ----------------------------------------------------------------------------------------------------------------------------------------------------------------------------------------------------------------- Physical Exam BP 111/66 (BP Location: Left Arm, Patient Position: Sitting, Cuff Size: Normal)   Pulse 87   Ht 4\' 11"  (1.499 m)   Wt 104 lb (47.2 kg)   LMP 12/01/2022   SpO2 98%   BMI 21.01 kg/m   Physical Exam Constitutional:      Appearance: Normal appearance.  HENT:     Head: Normocephalic and atraumatic.  Eyes:     General: No scleral icterus. Cardiovascular:     Rate and Rhythm: Normal rate and regular rhythm.  Pulmonary:     Effort: Pulmonary effort is normal.     Breath sounds: Normal breath sounds.  Neurological:     Mental Status: She is alert.  Psychiatric:        Mood and Affect: Mood normal.        Behavior: Behavior normal.     ------------------------------------------------------------------------------------------------------------------------------------------------------------------------------------------------------------------- Assessment and Plan  Palpitations Possibly related to her anemia.  Checking ferritin, folate and B12.  Zio patch ordered as well.    No orders of the defined types were placed in this encounter.   No follow-ups on file.    This visit occurred during the SARS-CoV-2 public health emergency.  Safety protocols were in place, including screening questions prior to the visit, additional usage of staff PPE, and extensive cleaning of exam room while observing appropriate contact time as indicated for disinfecting solutions.

## 2022-12-21 NOTE — Progress Notes (Unsigned)
Enrolled patient for a 14 day Zio XT monitor to be mailed to patients home  DOD to read 

## 2022-12-22 LAB — IRON,TIBC AND FERRITIN PANEL
%SAT: 23 % (calc) (ref 16–45)
Ferritin: 16 ng/mL (ref 16–154)
Iron: 79 ug/dL (ref 40–190)
TIBC: 345 mcg/dL (calc) (ref 250–450)

## 2022-12-22 LAB — B12 AND FOLATE PANEL
Folate: >24 ng/mL
Vitamin B-12: 667 pg/mL (ref 200–1100)

## 2023-04-05 ENCOUNTER — Ambulatory Visit: Payer: Medicaid Other | Admitting: Family Medicine

## 2023-04-10 ENCOUNTER — Encounter: Payer: Self-pay | Admitting: Family Medicine

## 2023-04-10 ENCOUNTER — Ambulatory Visit: Payer: Medicaid Other | Admitting: Family Medicine

## 2023-04-10 ENCOUNTER — Ambulatory Visit (INDEPENDENT_AMBULATORY_CARE_PROVIDER_SITE_OTHER): Payer: Medicaid Other

## 2023-04-10 VITALS — BP 118/74 | HR 73 | Ht 59.0 in | Wt 105.0 lb

## 2023-04-10 DIAGNOSIS — R109 Unspecified abdominal pain: Secondary | ICD-10-CM | POA: Diagnosis not present

## 2023-04-10 NOTE — Progress Notes (Signed)
Tina [name] - 37 y.o. female MRN [id number]  Date of birth: 1985-07-30  Subjective Chief Complaint  Patient presents with   Flank Pain   Chest Pain    HPI Tina [name] is a 37 y.o. female here today with complain of discomfort to the bilateral flank and lower rib area.  Symptoms started a couple of weeks ago.  She does not recall any known injury of overuse.  Denies dysuria or frequency.  Discomfort is not really changed with movement.  Her bowels are moving pretty normally  Only change that she can think of is that she has re-incorporated meat back into her diet.  She does have history of IBS that she has been able to manage with dietary changes.   ROS:  A comprehensive ROS was completed and negative except as noted per HPI  Allergies  Allergen Reactions   Banana Swelling    Throat swelling   Cefazolin Swelling    Facial edema (no airway edema) during c-section after ancef. Has not been formally tested for cephalosporin allergy.    Diphenhydramine Hives   Latex Hives and Swelling    Past Medical History:  Diagnosis Date   Allergy    Asthma    Heart murmur     Past Surgical History:  Procedure Laterality Date   BREAST EXCISIONAL BIOPSY Left 2007   CESAREAN SECTION N/A 10/20/2020   Procedure: CESAREAN SECTION;  Surgeon: Lavina Hamman, MD;  Location: [facility] LD [facility]S;  Service: Obstetrics;  Laterality: N/A;  FHR Decelerations   COLPOSCOPY     LAPAROSCOPY     WISDOM TOOTH EXTRACTION      Social History   Socioeconomic History   Marital status: Single    Spouse name: Not on file   Number of children: Not on file   Years of education: Not on file   Highest education level: Not on file  Occupational History   Not on file  Tobacco Use   Smoking status: Former    Types: E-cigarettes   Smokeless tobacco: Never  Vaping Use   Vaping status: Never Used  Substance and Sexual Activity   Alcohol use: Not Currently    Comment: occ   Drug use: No   Sexual  activity: Yes    Birth control/protection: Condom  Other Topics Concern   Not on file  Social History Narrative   Not on file   Social Determinants of Health   Financial Resource Strain: Low Risk  (02/04/2019)   Overall Financial Resource Strain (CARDIA)    Difficulty of Paying Living Expenses: Not hard at all  Food Insecurity: No Food Insecurity (02/04/2019)   Hunger Vital Sign    Worried About Running Out of Food in the Last Year: Never true    Ran Out of Food in the Last Year: Never true  Transportation Needs: No Transportation Needs (02/04/2019)   PRAPARE - Administrator, Civil Service (Medical): No    Lack of Transportation (Non-Medical): No  Physical Activity: Inactive (08/23/2017)   Exercise Vital Sign    Days of Exercise per Week: 0 days    Minutes of Exercise per Session: 0 min  Stress: No Stress Concern Present (08/23/2017)   [employer] of Occupational Health - Occupational Stress Questionnaire    Feeling of Stress : Not at all  Social Connections: Somewhat Isolated (08/23/2017)   Social Connection and Isolation Panel [NHANES]    Frequency of Communication with Friends and Family: More than three times  a week    Frequency of Social Gatherings with Friends and Family: Once a week    Attends Religious Services: Never    Database administrator or Organizations: Yes    Attends Engineer, structural: 1 to 4 times per year    Marital Status: Never married    Family History  Problem Relation Age of Onset   Diabetes Mother    Hypertension Father    Asthma Father    Diabetes Father    Cancer Maternal Aunt        colon   Lupus Maternal Aunt    Diabetes Maternal Aunt    Breast cancer Maternal Grandmother    Breast cancer Paternal Grandmother    Diabetes Paternal Grandmother    Hypertension Maternal Aunt    Diabetes Maternal Aunt    Diabetes Maternal Aunt    Allergic rhinitis Sister    Food Allergy Sister    Eczema Sister    Asthma Maternal  Uncle    Diabetes Paternal Grandfather     Health Maintenance  Topic Date Due   DTaP/Tdap/Td (1 - Tdap) Never done   Cervical Cancer Screening (HPV/Pap Cotest)  Never done   COVID-19 Vaccine (1) 09/13/2023 (Originally 02/[birth year]/1992)   INFLUENZA VACCINE  10/08/2023 (Originally 02/08/2023)   Hepatitis C Screening  Completed   HIV Screening  Completed   HPV VACCINES  Aged Out     ----------------------------------------------------------------------------------------------------------------------------------------------------------------------------------------------------------------- Physical Exam BP 118/74 (BP Location: Left Arm, Patient Position: Sitting, Cuff Size: Small)   Pulse 73   Ht 4\' 11"  (1.499 m)   Wt 105 lb (47.6 kg)   SpO2 100%   Breastfeeding No   BMI 21.21 kg/m   Physical Exam Constitutional:      Appearance: Normal appearance.  HENT:     Head: Normocephalic and atraumatic.  Eyes:     General: No scleral icterus. Cardiovascular:     Rate and Rhythm: Normal rate and regular rhythm.  Musculoskeletal:     Cervical back: Neck supple.  Neurological:     General: No focal deficit present.     Mental Status: She is alert.  Psychiatric:        Mood and Affect: Mood normal.        Behavior: Behavior normal.     ------------------------------------------------------------------------------------------------------------------------------------------------------------------------------------------------------------------- Assessment and Plan  Abdominal discomfort in flank UA and UPT today are normal.  KUB ordered.  Possibly related to constipation from IBS.   Checking CMP, CBC and TSH.     No orders of the defined types were placed in this encounter.   No follow-ups on file.    This visit occurred during the SARS-CoV-2 public health emergency.  Safety protocols were in place, including screening questions prior to the visit, additional usage of staff PPE,  and extensive cleaning of exam room while observing appropriate contact time as indicated for disinfecting solutions.

## 2023-04-10 NOTE — Assessment & Plan Note (Addendum)
UA and UPT today are normal.  KUB ordered.  Possibly related to constipation from IBS.   Checking CMP, CBC and TSH.

## 2023-04-11 LAB — CBC WITH DIFFERENTIAL/PLATELET
Basophils Absolute: 0 10*3/uL (ref 0.0–0.2)
Basos: 1 %
EOS (ABSOLUTE): 0.2 10*3/uL (ref 0.0–0.4)
Eos: 3 %
Hematocrit: 38.8 % (ref 34.0–46.6)
Hemoglobin: 12.5 g/dL (ref 11.1–15.9)
Immature Grans (Abs): 0 10*3/uL (ref 0.0–0.1)
Immature Granulocytes: 0 %
Lymphocytes Absolute: 1.8 10*3/uL (ref 0.7–3.1)
Lymphs: 33 %
MCH: 30 pg (ref 26.6–33.0)
MCHC: 32.2 g/dL (ref 31.5–35.7)
MCV: 93 fL (ref 79–97)
Monocytes Absolute: 0.3 10*3/uL (ref 0.1–0.9)
Monocytes: 6 %
Neutrophils Absolute: 3.2 10*3/uL (ref 1.4–7.0)
Neutrophils: 57 %
Platelets: 307 10*3/uL (ref 150–450)
RBC: 4.16 x10E6/uL (ref 3.77–5.28)
RDW: 12.5 % (ref 11.7–15.4)
WBC: 5.5 10*3/uL (ref 3.4–10.8)

## 2023-04-11 LAB — CMP14+EGFR
ALT: 11 [IU]/L (ref 0–32)
AST: 20 [IU]/L (ref 0–40)
Albumin: 5 g/dL — ABNORMAL HIGH (ref 3.9–4.9)
Alkaline Phosphatase: 51 [IU]/L (ref 44–121)
BUN/Creatinine Ratio: 11 (ref 9–23)
BUN: 7 mg/dL (ref 6–20)
Bilirubin Total: 0.4 mg/dL (ref 0.0–1.2)
CO2: 21 mmol/L (ref 20–29)
Calcium: 10.6 mg/dL — ABNORMAL HIGH (ref 8.7–10.2)
Chloride: 98 mmol/L (ref 96–106)
Creatinine, Ser: 0.66 mg/dL (ref 0.57–1.00)
Globulin, Total: 3.2 g/dL (ref 1.5–4.5)
Glucose: 87 mg/dL (ref 70–99)
Potassium: 4.6 mmol/L (ref 3.5–5.2)
Sodium: 137 mmol/L (ref 134–144)
Total Protein: 8.2 g/dL (ref 6.0–8.5)
eGFR: 117 mL/min/{1.73_m2} (ref >59)

## 2023-04-11 LAB — POCT URINALYSIS DIP (CLINITEK)
Bilirubin, UA: NEGATIVE
Blood, UA: NEGATIVE
Glucose, UA: NEGATIVE mg/dL
Ketones, POC UA: NEGATIVE mg/dL
Leukocytes, UA: NEGATIVE
Nitrite, UA: NEGATIVE
POC PROTEIN,UA: NEGATIVE
Spec Grav, UA: 1.015 (ref 1.010–1.025)
Urobilinogen, UA: 0.2 U/dL
pH, UA: 7 (ref 5.0–8.0)

## 2023-04-11 LAB — POCT URINE PREGNANCY: Preg Test, Ur: NEGATIVE

## 2023-04-11 LAB — TSH+FREE T4
Free T4: 1.25 ng/dL (ref 0.82–1.77)
TSH: 3.43 u[IU]/mL (ref 0.450–4.500)

## 2023-04-11 NOTE — Addendum Note (Signed)
Addended by: Ardyth Man on: 04/11/2023 10:50 AM   Modules accepted: Orders

## 2023-04-13 ENCOUNTER — Telehealth: Payer: Self-pay | Admitting: Family Medicine

## 2023-04-13 NOTE — Telephone Encounter (Signed)
Patient called about labs results states she saw results but hasn't heard anything would like a return call back please [phone]

## 2023-04-16 ENCOUNTER — Telehealth: Payer: Self-pay | Admitting: Family Medicine

## 2023-04-16 NOTE — Telephone Encounter (Signed)
Patient called in for lab results . Very anxious

## 2023-04-19 NOTE — Telephone Encounter (Signed)
Message sent to PCP.

## 2023-04-19 NOTE — Telephone Encounter (Signed)
Patient informed. 

## 2023-05-07 ENCOUNTER — Telehealth: Payer: Self-pay | Admitting: Family Medicine

## 2023-05-07 DIAGNOSIS — K589 Irritable bowel syndrome without diarrhea: Secondary | ICD-10-CM

## 2023-05-07 NOTE — Telephone Encounter (Signed)
Patient called in stating that she is still in pain with the IBS and would like a referral for Gastro? Please advise

## 2023-05-10 NOTE — Telephone Encounter (Signed)
Patient has been updated regarding the GI referral. Patient is aware to contact the office if she does not hear back for scheduling. No other inquires during the call.

## 2023-05-23 ENCOUNTER — Encounter (HOSPITAL_BASED_OUTPATIENT_CLINIC_OR_DEPARTMENT_OTHER): Payer: Self-pay | Admitting: Emergency Medicine

## 2023-05-23 ENCOUNTER — Other Ambulatory Visit: Payer: Self-pay

## 2023-05-23 ENCOUNTER — Emergency Department (HOSPITAL_BASED_OUTPATIENT_CLINIC_OR_DEPARTMENT_OTHER)
Admission: EM | Admit: 2023-05-23 | Discharge: 2023-05-23 | Disposition: A | Discharge status: Home / Self Care | Payer: Medicaid Other | Attending: Emergency Medicine | Admitting: Emergency Medicine

## 2023-05-23 DIAGNOSIS — R109 Unspecified abdominal pain: Secondary | ICD-10-CM | POA: Insufficient documentation

## 2023-05-23 DIAGNOSIS — M62830 Muscle spasm of back: Secondary | ICD-10-CM

## 2023-05-23 DIAGNOSIS — K5900 Constipation, unspecified: Secondary | ICD-10-CM | POA: Diagnosis not present

## 2023-05-23 DIAGNOSIS — Z91040 Latex allergy status: Secondary | ICD-10-CM | POA: Insufficient documentation

## 2023-05-23 DIAGNOSIS — J45909 Unspecified asthma, uncomplicated: Secondary | ICD-10-CM | POA: Insufficient documentation

## 2023-05-23 LAB — COMPREHENSIVE METABOLIC PANEL
ALT: 12 U/L (ref 0–44)
AST: 18 U/L (ref 15–41)
Albumin: 4 g/dL (ref 3.5–5.0)
Alkaline Phosphatase: 34 U/L — ABNORMAL LOW (ref 38–126)
Anion gap: 9 (ref 5–15)
BUN: 9 mg/dL (ref 6–20)
CO2: 25 mmol/L (ref 22–32)
Calcium: 8.6 mg/dL — ABNORMAL LOW (ref 8.9–10.3)
Chloride: 101 mmol/L (ref 98–111)
Creatinine, Ser: 0.75 mg/dL (ref 0.44–1.00)
GFR, Estimated: >60 mL/min (ref >60)
Glucose, Bld: 88 mg/dL (ref 70–99)
Potassium: 3.5 mmol/L (ref 3.5–5.1)
Sodium: 135 mmol/L (ref 135–145)
Total Bilirubin: 0.6 mg/dL (ref <1.2)
Total Protein: 7.4 g/dL (ref 6.5–8.1)

## 2023-05-23 LAB — CBC WITH DIFFERENTIAL/PLATELET
Abs Immature Granulocytes: 0.01 10*3/uL (ref 0.00–0.07)
Basophils Absolute: 0.1 10*3/uL (ref 0.0–0.1)
Basophils Relative: 1 %
Eosinophils Absolute: 0.2 10*3/uL (ref 0.0–0.5)
Eosinophils Relative: 4 %
HCT: 33.9 % — ABNORMAL LOW (ref 36.0–46.0)
Hemoglobin: 11 g/dL — ABNORMAL LOW (ref 12.0–15.0)
Immature Granulocytes: 0 %
Lymphocytes Relative: 43 %
Lymphs Abs: 2.6 10*3/uL (ref 0.7–4.0)
MCH: 30.1 pg (ref 26.0–34.0)
MCHC: 32.4 g/dL (ref 30.0–36.0)
MCV: 92.6 fL (ref 80.0–100.0)
Monocytes Absolute: 0.5 10*3/uL (ref 0.1–1.0)
Monocytes Relative: 9 %
Neutro Abs: 2.6 10*3/uL (ref 1.7–7.7)
Neutrophils Relative %: 43 %
Platelets: 287 10*3/uL (ref 150–400)
RBC: 3.66 MIL/uL — ABNORMAL LOW (ref 3.87–5.11)
RDW: 12.5 % (ref 11.5–15.5)
WBC: 5.9 10*3/uL (ref 4.0–10.5)
nRBC: 0 % (ref 0.0–0.2)

## 2023-05-23 LAB — URINALYSIS, ROUTINE W REFLEX MICROSCOPIC
Bilirubin Urine: NEGATIVE
Glucose, UA: NEGATIVE mg/dL
Hgb urine dipstick: NEGATIVE
Ketones, ur: NEGATIVE mg/dL
Leukocytes,Ua: NEGATIVE
Nitrite: NEGATIVE
Protein, ur: NEGATIVE mg/dL
Specific Gravity, Urine: 1.02 (ref 1.005–1.030)
pH: 7 (ref 5.0–8.0)

## 2023-05-23 LAB — LIPASE, BLOOD: Lipase: 29 U/L (ref 11–51)

## 2023-05-23 LAB — PREGNANCY, URINE: Preg Test, Ur: NEGATIVE

## 2023-05-23 MED ORDER — CYCLOBENZAPRINE HCL 5 MG PO TABS: 5.0000 mg | ORAL_TABLET | Freq: Three times a day (TID) | ORAL | 0 refills | Status: DC | PRN

## 2023-05-23 MED ORDER — LIDOCAINE 5 % EX PTCH: 1.0000 | MEDICATED_PATCH | CUTANEOUS | 0 refills | Status: AC

## 2023-05-23 NOTE — ED Triage Notes (Signed)
Pt c/o LT flank pain x 1 mo; reports she has only urinated x 2 since yesterday

## 2023-05-23 NOTE — ED Provider Notes (Signed)
[place] [facility] Provider Note   CSN: [id number] Arrival date & time: 05/23/23  1740     History  Chief Complaint  Patient presents with   Flank Pain    Tina [name] is a 37 y.o. female past medical history of endometerioss, asthma and IBS presents today for abdominal pain.  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  Patient reports about a month ago she been having this left-sided flank pain, it is now more consistent for the past 2 days.  She is unable to describe the pain, reports is constant in nature, associates symptoms of decrease in urine frequency, no dysuria hematuria. Her last bowel movement was Saturday, but she reports that usually she has 3 bowel movements per week.  She does report constipation.  Denies any hematochezia or melena.    Denies any fever or chills.  Denies any cough, congestion, sore throat, chest pain, shortness of breath, leg swelling, abdominal pain.    Flank Pain Pertinent negatives include no chest pain, no abdominal pain, no headaches and no shortness of breath.    Home Medications Prior to Admission medications   Medication Sig Start Date End Date Taking? Authorizing Provider  albuterol (VENTOLIN HFA) 108 (90 Base) MCG/ACT inhaler 2 puffs every 4-6 hours as needed for coughing or wheezing spells 05/09/22   Nehemiah Settle, FNP  budesonide (PULMICORT FLEXHALER) 180 MCG/ACT inhaler Inhale 2 puffs twice a day to prevent coughing or wheezing.Rinse mouth out after to help prevent thrush. 11/23/22   Nehemiah Settle, FNP  budesonide (RHINOCORT AQUA) 32 MCG/ACT nasal spray Use 2 sprays in each nostril once a day as needed for stuffy nose. 11/23/22   Nehemiah Settle, FNP  EPINEPHrine (EPIPEN 2-PAK) 0.3 mg/0.3 mL IJ SOAJ injection Use  as directed for severe allergic reactions 11/23/22   Nehemiah Settle, FNP  naproxen (NAPROSYN) 500 MG tablet Take 1 tablet (500 mg total) by mouth 2 (two) times daily with a meal. 08/29/22   Everrett Coombe, DO  Spacer/Aero-Holding Chambers DEVI 01 spacer 12/09/21   Nehemiah Settle, FNP      Allergies    Banana, Cefazolin, Diphenhydramine, and Latex    Review of Systems   Review of Systems  Constitutional:  Positive for fatigue. Negative for chills and fever.  HENT:  Negative for congestion and sore throat.   Respiratory:  Negative for cough, chest tightness and shortness of breath.   Cardiovascular:  Negative for chest pain and leg swelling.  Gastrointestinal:  Positive for constipation and nausea. Negative for abdominal pain, blood in stool and vomiting (chronic).  Genitourinary:  Positive for flank pain. Negative for dysuria, frequency, hematuria and pelvic pain.  Musculoskeletal:  Positive for back pain (Left flank).  Neurological:  Negative for dizziness, weakness, light-headedness, numbness and headaches.    Physical Exam Updated Vital Signs BP 120/83 (BP Location: Right Arm)   Pulse 71   Temp 97.7 F (36.5 C)   Resp 18   Ht 4\' 11"  (1.499 m)   Wt 47.6 kg   LMP 05/15/2023   SpO2 100%   BMI 21.21 kg/m  Physical Exam  Constitutional: well-appearing laying in bed, in no acute distress HENT: normocephalic atraumatic, mucous membranes moist Cardiovascular: regular rate and rhythm, no m/r/g Pulmonary/Chest: normal work of breathing  on room air, lungs clear to auscultation bilaterally Abdominal: soft, non-tender, non-distended, bowel sounds present MSK: +TTP left L3-L4 paraspinal muscles, no CVA tenderness  Neurological: alert & oriented x 3, no focal deficit Skin: warm and dry Psych: normal mood and behavior  ED Results / Procedures / Treatments   Labs (all labs ordered are listed, but only abnormal results are displayed) Labs Reviewed  COMPREHENSIVE METABOLIC PANEL -  Abnormal; Notable for the following components:      Result Value   Calcium 8.6 (*)    Alkaline Phosphatase 34 (*)    All other components within normal limits  CBC WITH DIFFERENTIAL/PLATELET - Abnormal; Notable for the following components:   RBC 3.66 (*)    Hemoglobin 11.0 (*)    HCT 33.9 (*)    All other components within normal limits  URINALYSIS, ROUTINE W REFLEX MICROSCOPIC  PREGNANCY, URINE  LIPASE, BLOOD    EKG None  Radiology No results found.  Procedures Procedures  None   Medications Ordered in ED Medications - No data to display  ED Course/ Medical Decision Making/ A&P                              Medical Decision Making Amount and/or Complexity of Data Reviewed Labs: ordered.   This patient presents to the ED for concern of abdominal pain, this involves an extensive number of treatment options, and is a complaint that carries with it a high risk of complications and morbidity.  The differential diagnosis includes pancreatitis, nephrolithiasis, colitis, diverticulitis/diverticulosis, constipation.   Co morbidities that complicate the patient evaluation  IBS   Additional history obtained:  Additional history obtained from chart review  External records from outside source obtained and reviewed including none   Lab Tests:  I Ordered, and personally interpreted labs.  The pertinent results include: No electrolyte abnormalities, normal LFTs, normal creatinine, no leukocytosis, hemoglobin 11, platelet normal, pregnancy test negative, UA negative.  Imaging Studies ordered:  None  Cardiac Monitoring: / EKG:  None   Consultations Obtained:  None  Problem List / ED Course / Critical interventions / Medication management  Low back pain, left sided None  Reevaluation of the patient after these medicines showed that the patient improved I have reviewed the patients home medicines and have made adjustments as needed   Social Determinants of  Health:  As above    Test / Admission - Considered:  This is a 37 year old female with past medical history of endometriosis, asthma, IBS presents today for 1 month left-sided flank pain, which has been more consistent for the past couple of days.  Patient denies any fever, chills, dysuria, hematic area.  She denies any abdominal pain, vomiting.  She reports chronic nausea.  Patient reports her last bowel movement was on Saturday, which is pretty regular for her.  Denies any hematochezia or melena.  Per the physical exam, she has tenderness to palpation along her left L3-L4 paraspinal muscles, no CVA tenderness.  No abdominal tenderness noted.  Labs are reassuring, no electrolyte abnormalities, normal lipase, normal LFTs, no leukocytosis, platelets normal, hemoglobin 11.0, pregnancy test negative, UA is negative.  Patient symptoms likely due to muscle sprain, low suspicion for pancreatitis, colitis, nephrolithiasis.  Patient agrees with no imaging at this time.  Plan to send with the Flexeril and Lidoderm patches.  Patient is advised to follow-up with PCP.   Final Clinical Impression(s) / ED Diagnoses Final diagnoses:  None    Rx / DC Orders ED Discharge Orders     None         Jeral Pinch, DO 05/23/23 2227    Tegeler, Canary Brim, MD 05/24/23 [phone]

## 2023-05-23 NOTE — Discharge Instructions (Addendum)
Please take Flexeril 5 mg, take 1 tablet by mouth 3 times a day as needed for muscle spasms.  You can apply the Lidoderm patches onto your skin every 24 hours.   Of note Flexeril can cause dizziness, drowsiness, constipation.  I advise you to start with 1 tablet at night and slowly titrated up as needed for possible spasms.  Please follow-up with your primary care doctor.

## 2023-05-23 NOTE — ED Notes (Signed)
Pt c/o left flank pain x 1 month, decreased urination Denies any injury

## 2023-05-24 ENCOUNTER — Telehealth: Payer: Self-pay

## 2023-05-24 NOTE — Telephone Encounter (Signed)
Prior Authorization for patient (Lidocaine 5% patches) came through on cover my meds was submitted awaiting approval or denial.  [id number]

## 2023-05-24 NOTE — Telephone Encounter (Signed)
Decision:Denied [facility] reviewed your LIDOCAINE 5% PATCH request for the above-identified member,  and it is denied for the following reason: because we did not see what we need to approve the  drug you asked for, (lidocaine 5 percent patch). This request tells [place] your doctor asked for this  drug for your illness (muscle spasm of back). We may be able to approve this drug for certain  illnesses (post-herpetic neuralgia, neuropathic pain, and chronic musculo-skeletal pain [greater  than 6 months in duration]). If you do have one of these illnesses, we may need more  information. We based this decision on your health plan's prior authorization clinical criteria  named Topical Local Anesthetics

## 2023-05-28 ENCOUNTER — Encounter (HOSPITAL_BASED_OUTPATIENT_CLINIC_OR_DEPARTMENT_OTHER): Payer: Self-pay

## 2023-05-28 ENCOUNTER — Emergency Department (HOSPITAL_BASED_OUTPATIENT_CLINIC_OR_DEPARTMENT_OTHER)
Admission: EM | Admit: 2023-05-28 | Discharge: 2023-05-28 | Disposition: A | Discharge status: Home / Self Care | Payer: Medicaid Other | Attending: Emergency Medicine | Admitting: Emergency Medicine

## 2023-05-28 ENCOUNTER — Emergency Department (HOSPITAL_BASED_OUTPATIENT_CLINIC_OR_DEPARTMENT_OTHER): Payer: Medicaid Other

## 2023-05-28 ENCOUNTER — Other Ambulatory Visit: Payer: Self-pay

## 2023-05-28 ENCOUNTER — Other Ambulatory Visit (HOSPITAL_BASED_OUTPATIENT_CLINIC_OR_DEPARTMENT_OTHER): Payer: Self-pay

## 2023-05-28 DIAGNOSIS — Z91040 Latex allergy status: Secondary | ICD-10-CM | POA: Insufficient documentation

## 2023-05-28 DIAGNOSIS — M5126 Other intervertebral disc displacement, lumbar region: Secondary | ICD-10-CM | POA: Insufficient documentation

## 2023-05-28 DIAGNOSIS — M545 Low back pain, unspecified: Secondary | ICD-10-CM | POA: Diagnosis present

## 2023-05-28 LAB — URINALYSIS, ROUTINE W REFLEX MICROSCOPIC
Bilirubin Urine: NEGATIVE
Glucose, UA: NEGATIVE mg/dL
Hgb urine dipstick: NEGATIVE
Ketones, ur: NEGATIVE mg/dL
Leukocytes,Ua: NEGATIVE
Nitrite: NEGATIVE
Specific Gravity, Urine: 1.028 (ref 1.005–1.030)
pH: 6.5 (ref 5.0–8.0)

## 2023-05-28 LAB — CBC WITH DIFFERENTIAL/PLATELET
Abs Immature Granulocytes: 0.01 10*3/uL (ref 0.00–0.07)
Basophils Absolute: 0.1 10*3/uL (ref 0.0–0.1)
Basophils Relative: 1 %
Eosinophils Absolute: 0.2 10*3/uL (ref 0.0–0.5)
Eosinophils Relative: 4 %
HCT: 34.1 % — ABNORMAL LOW (ref 36.0–46.0)
Hemoglobin: 11.3 g/dL — ABNORMAL LOW (ref 12.0–15.0)
Immature Granulocytes: 0 %
Lymphocytes Relative: 36 %
Lymphs Abs: 1.6 10*3/uL (ref 0.7–4.0)
MCH: 30.5 pg (ref 26.0–34.0)
MCHC: 33.1 g/dL (ref 30.0–36.0)
MCV: 91.9 fL (ref 80.0–100.0)
Monocytes Absolute: 0.4 10*3/uL (ref 0.1–1.0)
Monocytes Relative: 8 %
Neutro Abs: 2.3 10*3/uL (ref 1.7–7.7)
Neutrophils Relative %: 51 %
Platelets: 282 10*3/uL (ref 150–400)
RBC: 3.71 MIL/uL — ABNORMAL LOW (ref 3.87–5.11)
RDW: 12.3 % (ref 11.5–15.5)
WBC: 4.4 10*3/uL (ref 4.0–10.5)
nRBC: 0 % (ref 0.0–0.2)

## 2023-05-28 LAB — BASIC METABOLIC PANEL
Anion gap: 6 (ref 5–15)
BUN: 8 mg/dL (ref 6–20)
CO2: 29 mmol/L (ref 22–32)
Calcium: 8.9 mg/dL (ref 8.9–10.3)
Chloride: 102 mmol/L (ref 98–111)
Creatinine, Ser: 0.67 mg/dL (ref 0.44–1.00)
GFR, Estimated: >60 mL/min (ref >60)
Glucose, Bld: 85 mg/dL (ref 70–99)
Potassium: 3.9 mmol/L (ref 3.5–5.1)
Sodium: 137 mmol/L (ref 135–145)

## 2023-05-28 LAB — PREGNANCY, URINE: Preg Test, Ur: NEGATIVE

## 2023-05-28 MED ORDER — ACETAMINOPHEN 500 MG PO TABS
1000.0000 mg | ORAL_TABLET | Freq: Once | ORAL | Status: AC
  Administered 2023-05-28: 1000 mg via ORAL
  Filled 2023-05-28: qty 2

## 2023-05-28 MED ORDER — METHYLPREDNISOLONE 4 MG PO TBPK
ORAL_TABLET | ORAL | 0 refills | Status: DC
  Filled 2023-05-28: qty 21, 6d supply, fill #0

## 2023-05-28 MED ORDER — KETOROLAC TROMETHAMINE 30 MG/ML IJ SOLN
30.0000 mg | Freq: Once | INTRAMUSCULAR | Status: AC
  Administered 2023-05-28: 30 mg via INTRAVENOUS
  Filled 2023-05-28: qty 1

## 2023-05-28 MED ORDER — KETOROLAC TROMETHAMINE 60 MG/2ML IM SOLN
30.0000 mg | Freq: Once | INTRAMUSCULAR | Status: DC
  Filled 2023-05-28: qty 2

## 2023-05-28 NOTE — Discharge Instructions (Addendum)
While you are in the emergency department, you have blood work done that was normal.  Your CT scan showed a disc bulge at L5 and S1.  This is at your lower back, where your back meets her tailbone.  Discharging with a prescription for a steroid called Medrol.  Take it as instructed by the dose packaging.  Please set up an appointment with your primary care doctor to discuss physical therapy, as this will often be more effective long-term than medications.

## 2023-05-28 NOTE — ED Provider Notes (Signed)
[place] EM[facility]GENCY DEPARTMENT AT [facility] Provider Note   CSN: [id number] Arrival date & time: 05/28/23  0913     History  Chief Complaint  Patient presents with   Back Pain    Tina [name] is a 37 y.o. female.  This is a 37 year old female is here today for nontraumatic back pain.  Patient says that her back has been bothering her for about 1 month.  She seen her PCP twice, and went to an urgent care.  She has not gotten any relief.  She is not symptoms seem to be getting a bit worse.  She was prescribed Flexeril, but says that she cannot take that because she cannot work if she is taking Flexeril.  She denies any trauma to the area, no significant lifting.   Back Pain      Home Medications Prior to Admission medications   Medication Sig Start Date End Date Taking? Authorizing Provider  albuterol (VENTOLIN HFA) 108 (90 Base) MCG/ACT inhaler 2 puffs every 4-6 hours as needed for coughing or wheezing spells 05/09/22   Nehemiah Settle, FNP  budesonide (PULMICORT FLEXHALER) 180 MCG/ACT inhaler Inhale 2 puffs twice a day to prevent coughing or wheezing.Rinse mouth out after to help prevent thrush. 11/23/22   Nehemiah Settle, FNP  budesonide (RHINOCORT AQUA) 32 MCG/ACT nasal spray Use 2 sprays in each nostril once a day as needed for stuffy nose. 11/23/22   Nehemiah Settle, FNP  cyclobenzaprine (FLEXERIL) 5 MG tablet Take 1 tablet (5 mg total) by mouth 3 (three) times daily as needed for muscle spasms. 05/23/23   Tawkaliyar, Roya, DO  EPINEPHrine (EPIPEN 2-PAK) 0.3 mg/0.3 mL IJ SOAJ injection Use as directed for severe allergic reactions 11/23/22   Nehemiah Settle, FNP  lidocaine (LIDODERM) 5 % Place 1 patch onto the skin daily. Remove & Discard patch within 12 hours or as directed by MD 05/23/23 06/22/23  Jeral Pinch, DO  naproxen (NAPROSYN) 500 MG tablet Take 1 tablet (500 mg total) by mouth 2 (two) times daily with a meal. 08/29/22   Everrett Coombe, DO   Spacer/Aero-Holding Chambers DEVI 01 spacer 12/09/21   Nehemiah Settle, FNP      Allergies    Banana, Cefazolin, Diphenhydramine, and Latex    Review of Systems   Review of Systems  Musculoskeletal:  Positive for back pain.    Physical Exam Updated Vital Signs BP 108/66 (BP Location: Right Arm)   Pulse 72   Temp 98 F (36.7 C) (Oral)   Resp 16   Ht 4\' 11"  (1.499 m)   Wt 47.6 kg   LMP 05/15/2023   SpO2 100%   BMI 21.21 kg/m  Physical Exam Vitals reviewed.  Pulmonary:     Effort: Pulmonary effort is normal.  Abdominal:     General: Abdomen is flat.     Palpations: Abdomen is soft.  Musculoskeletal:     Comments: Negative straight leg test bilaterally.  Patient has pain in the left superior sacrum.  No midline spinous process tenderness  Neurological:     Mental Status: She is alert.     Comments: Normal gait, no numbness, or weakness in the bilateral lower extremities.  No saddle anesthesia.     ED Results / Procedures / Treatments   Labs (all labs ordered are listed, but only abnormal results are displayed) Labs Reviewed  CBC WITH DIFFERENTIAL/PLATELET - Abnormal; Notable for the following components:      Result Value   RBC 3.71 (*)  Hemoglobin 11.3 (*)    HCT 34.1 (*)    All other components within normal limits  URINALYSIS, ROUTINE W REFLEX MICROSCOPIC - Abnormal; Notable for the following components:   Protein, ur TRACE (*)    All other components within normal limits  BASIC METABOLIC PANEL  PREGNANCY, URINE    EKG None  Radiology CT Lumbar Spine Wo Contrast  Result Date: 05/28/2023 CLINICAL DATA:  Lumbar radiculopathy.  Back pain. EXAM: CT LUMBAR SPINE WITHOUT CONTRAST TECHNIQUE: Multidetector CT imaging of the lumbar spine was performed without intravenous contrast administration. Multiplanar CT image reconstructions were also generated. RADIATION DOSE REDUCTION: This exam was performed according to the departmental dose-optimization program which  includes automated exposure control, adjustment of the mA and/or kV according to patient size and/or use of iterative reconstruction technique. COMPARISON:  None Available. FINDINGS: Segmentation: Conventional numbering is assumed with 5 non-rib-bearing, lumbar type vertebral bodies. Alignment: Normal. Vertebrae: Normal vertebral body heights. No acute fracture or suspicious bone lesion. Paraspinal and other soft tissues: Unremarkable. Disc levels: Moderate degenerative changes of the right-greater-than-left sacroiliac joints. T12-L1:  Normal. L1-L2:  Normal. L2-L3:  Normal. L3-L4:  Normal. L4-L5: Small disc bulge and mild bilateral facet arthropathy. No spinal canal stenosis or neural foraminal narrowing. L5-S1: Disc bulge and left-greater-than-right facet arthropathy results in moderate left neural foraminal narrowing. IMPRESSION: 1. No acute fracture or traumatic listhesis of the lumbar spine. 2. Disc bulge and left-greater-than-right facet arthropathy at L5-S1 results in moderate left neural foraminal narrowing. 3. Moderate degenerative changes of the right-greater-than-left sacroiliac joints. Electronically Signed   By: Orvan Falconer M.D.   On: 05/28/2023 11:40    Procedures Procedures    Medications Ordered in ED Medications  acetaminophen (TYLENOL) tablet 1,000 mg (1,000 mg Oral Given 05/28/23 1048)  ketorolac (TORADOL) 30 MG/ML injection 30 mg (30 mg Intravenous Given 05/28/23 1054)    ED Course/ Medical Decision Making/ A&P                                 Medical Decision Making 37 year old female here today for 1 month of back pain.  Differential diagnoses include musculoskeletal back pain, less likely cauda equina, less likely spinal epidural abscess, malignancy.  Plan-with how long the patient has had symptoms, and her having negative straight leg test, I do of concern for possible more sinister etiology.  Will obtain imaging of the patient's lumbar spine.  Patient without any  neurological deficits, do not believe MRI is indicated at this time.  Will provide her with some analgesia here in the emergency department.  Reassessment 11:55 AM-patient CT imaging does show disc bulge on the left at L5-S1.  This is consistent with the patient's pain.  Believe this is likely contributing to her symptoms.  Will discharge patient with Medrol Dosepak.  Discussed with patient, advised her to set up physical therapy with her primary care doctor.  Patient agreeable with plan.  Amount and/or Complexity of Data Reviewed Labs: ordered. Radiology: ordered.  Risk OTC drugs. Prescription drug management.           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

## 2023-05-28 NOTE — ED Triage Notes (Signed)
For eval of back pain x1 month. Has seen PCP x2 and urgent care on Wednesday. Pain has worsened. Denies injury. Denies nausea or vomiting. Walking hunched over. Denies pain/tingling/numbness in legs.

## 2023-05-28 NOTE — ED Notes (Signed)
Discharge paperwork given and verbally understood. 

## 2023-05-29 ENCOUNTER — Ambulatory Visit: Payer: Medicaid Other | Admitting: Family

## 2023-06-20 ENCOUNTER — Encounter: Payer: Self-pay | Admitting: Family Medicine

## 2023-06-20 ENCOUNTER — Ambulatory Visit: Payer: Medicaid Other | Admitting: Family Medicine

## 2023-06-20 VITALS — BP 122/80 | HR 86 | Ht 59.0 in | Wt 109.0 lb

## 2023-06-20 DIAGNOSIS — M5136 Other intervertebral disc degeneration, lumbar region with discogenic back pain only: Secondary | ICD-10-CM | POA: Insufficient documentation

## 2023-06-20 MED ORDER — MELOXICAM 7.5 MG PO TABS: ORAL_TABLET | ORAL | 1 refills | Status: DC

## 2023-06-20 MED ORDER — METAXALONE 800 MG PO TABS: 800.0000 mg | ORAL_TABLET | Freq: Three times a day (TID) | ORAL | 0 refills | Status: DC | PRN

## 2023-06-20 NOTE — Progress Notes (Signed)
Tina [name] - 37 y.o. female MRN [id number]  Date of birth: [birth year]  Subjective Chief Complaint  Patient presents with   Back Pain    HPI Tina [name] is a 36 y.o. female here today for hospital follow up. Seen for discogenic pain of the back.  She has tried NSAIDs and flexeril.  This does help but she is unable to work with flexeril.  She does get some pain that radiates to her abdomen at times.  Denies pain radiating down the legs.  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 Has not been formally tested for cephalosporin allergy.    Diphenhydramine Hives   Latex Hives and Swelling    Past Medical History:  Diagnosis Date   Allergy    Asthma    Heart murmur     Past Surgical History:  Procedure Laterality Date   BREAST EXCISIONAL BIOPSY Left 2007   CESAREAN SECTION N/A 10/20/2020   Procedure: CESAREAN SECTION;  Surgeon: Lavina Hamman, MD;  Location: [facility] LD [facility]S;  Service: Obstetrics;  Laterality: N/A;  FHR Decelerations   COLPOSCOPY     LAPAROSCOPY     WISDOM TOOTH EXTRACTION      Social History   Socioeconomic History   Marital status: Single    Spouse name: Not on file   Number of children: Not on file   Years of education: Not on file   Highest education level: Not on file  Occupational History   Not on file  Tobacco Use   Smoking status: Former    Types: E-cigarettes   Smokeless tobacco: Never  Vaping Use   Vaping status: Never Used  Substance and Sexual Activity   Alcohol use: Not Currently    Comment: occ   Drug use: No   Sexual activity: Yes    Birth control/protection: Condom  Other Topics Concern   Not on file  Social History Narrative   Not on file   Social Determinants of Health    Financial Resource Strain: Low Risk  (02/04/2019)   Overall Financial Resource Strain (CARDIA)    Difficulty of Paying Living Expenses: Not hard at all  Food Insecurity: No Food Insecurity (02/04/2019)   Hunger Vital Sign    Worried About Running Out of Food in the Last Year: Never true    Ran Out of Food in the Last Year: Never true  Transportation Needs: No Transportation Needs (02/04/2019)   PRAPARE - Administrator, Civil Service (Medical): No    Lack of Transportation (Non-Medical): No  Physical Activity: Inactive (08/23/2017)   Exercise Vital Sign    Days of Exercise per Week: 0 days    Minutes of Exercise per Session: 0 min  Stress: No Stress Concern Present (08/23/2017)   [employer] of Occupational Health - Occupational Stress Questionnaire    Feeling of Stress : Not at all  Social Connections: Somewhat Isolated (08/23/2017)   Social Connection and Isolation Panel [NHANES]    Frequency of Communication with Friends and Family: More than three times a week    Frequency of Social Gatherings with Friends and Family: Once a week    Attends Religious Services: Never    Database administrator or  Organizations: Yes    Attends Engineer, structural: 1 to 4 times per year    Marital Status: Never married    Family History  Problem Relation Age of Onset   Diabetes Mother    Hypertension Father    Asthma Father    Diabetes Father    Cancer Maternal Aunt        colon   Lupus Maternal Aunt    Diabetes Maternal Aunt    Breast cancer Maternal Grandmother    Breast cancer Paternal Grandmother    Diabetes Paternal Grandmother    Hypertension Maternal Aunt    Diabetes Maternal Aunt    Diabetes Maternal Aunt    Allergic rhinitis Sister    Food Allergy Sister    Eczema Sister    Asthma Maternal Uncle    Diabetes Paternal Grandfather     Health Maintenance  Topic Date Due   DTaP/Tdap/Td (1 - Tdap) Never done   Cervical Cancer Screening (HPV/Pap Cotest)   Never done   COVID-19 Vaccine (1) 09/13/2023 (Originally 02/22/1992)   INFLUENZA VACCINE  10/08/2023 (Originally 02/08/2023)   Hepatitis C Screening  Completed   HIV Screening  Completed   HPV VACCINES  Aged Out     ----------------------------------------------------------------------------------------------------------------------------------------------------------------------------------------------------------------- Physical Exam BP 122/80 (BP Location: Left Arm, Patient Position: Sitting, Cuff Size: Small)   Pulse 86   Ht 4\' 11"  (1.499 m)   Wt 109 lb (49.4 kg)   LMP 05/15/2023   SpO2 99%   BMI 22.02 kg/m   Physical Exam Constitutional:      Appearance: Normal appearance.  Eyes:     General: No scleral icterus. Neurological:     Mental Status: She is alert.  Psychiatric:        Mood and Affect: Mood normal.        Behavior: Behavior normal.     ------------------------------------------------------------------------------------------------------------------------------------------------------------------------------------------------------------------- Assessment and Plan  Discogenic low back pain Change naproxen to meloxicam for daily use.  Add skelaxin as other muscle relaxers make her too drowsy.  Referral placed to PT.    Meds ordered this encounter  Medications   meloxicam (MOBIC) 7.5 MG tablet    Sig: Take 1 tab every 12 hours as needed.    Dispense:  30 tablet    Refill:  1   metaxalone (SKELAXIN) 800 MG tablet    Sig: Take 1 tablet (800 mg total) by mouth 3 (three) times daily as needed for muscle spasms.    Dispense:  90 tablet    Refill:  0    No follow-ups on file.    This visit occurred during the SARS-CoV-2 public health emergency.  Safety protocols were in place, including screening questions prior to the visit, additional usage of staff PPE, and extensive cleaning of exam room while observing appropriate contact time as indicated for  disinfecting solutions.

## 2023-06-20 NOTE — Assessment & Plan Note (Signed)
Change naproxen to meloxicam for daily use.  Add skelaxin as other muscle relaxers make her too drowsy.  Referral placed to PT.

## 2023-06-20 NOTE — Patient Instructions (Signed)
Try meloxicam to replace naproxen.  Try skelaxin-this is usually less drowsy.

## 2023-06-27 ENCOUNTER — Ambulatory Visit: Payer: Self-pay | Admitting: *Deleted

## 2023-06-27 NOTE — Telephone Encounter (Signed)
Summary: lower back discomfort / urinary concern   The patient was recently notified that they had a herniated disc in their lower back  The patient has shared that they continue to experience lower back discomfort as well  The patient shares that they have also noticed a decrease in the amount of urine they produce as well as the frequency of urination  The patient would like to speak with a member of clinical staff when possible  The patient called on the community line  Please contact when available     Chief Complaint: Urine Dark Symptoms: Infrequency urine Frequency: unsure Pertinent Negatives: Patient denies any associated symptoms. Disposition: [] ED /[] Urgent Care (no appt availability in office) / [] Appointment(In office/virtual)/ []  [facility]/ [x] Home Care/ [] Refused Recommended Disposition /[] [facility]/ []  Follow-up with PCP Additional Notes:  Home care advise provided, pt verbalizes understanding. UC for worsening symptoms.No PCP 2 Reason for Disposition  All other urine symptoms  Answer Assessment - Initial Assessment Questions 1. SYMPTOM: "What's the main symptom you're concerned about?" (e.g., frequency, incontinence)     Infrequency 2. ONSET: "When did the    start?"     2-3 days 3. PAIN: "Is there any pain?" If Yes, ask: "How bad is it?" (Scale: 1-10; mild, moderate, severe)     Burning 4. CAUSE: "What do you think is causing the symptoms?"     Unsure 5. OTHER SYMPTOMS: "Do you have any other symptoms?" (e.g., blood in urine, fever, flank pain, pain with urination)     Lower back pain  Protocols used: Urinary Symptoms-A-AH

## 2023-06-28 NOTE — Patient Instructions (Addendum)
Mild persistent asthma Continue Pulmicort Flexihaler 180 mcg 2 puffs twice a day to help prevent cough and wheeze. This is safe to use while breastfeeding. May use albuterol 2 puffs every 4 hours as needed for cough, wheeze, tightness in chest, or shortness of breath.  This is safe to use while pregnant or breastfeeding  Seasonal and perennial allergic rhinitis May use an over-the-counter any histamine such as Zyrtec (cetirizine), Claritin (loratadine), Xyzal (levocetirizine), or Allegra (fexofenadine) once a day as needed for runny nose May use Rhinocort 1-2 sprays each nostril once a day as needed for stuffy.  This medication is okay to use while breast-feeding and is the corticosteroid of choice while breast-feeding.  Gastroesophageal reflux disease Continue dietary and lifestyle modifications Continue Protonix as per your primary care physician.   Food allergy  Continue to avoid banana. Continue to carry epinephrine auto-injector with you at all times.   Can testing to banana today was negative.  We will get lab work to follow-up on this food allergy.  We will call you with results once they are back.   Please let us know if this treatment plan is not working well for you Schedule a follow-up appointment in 6 months or sooner if needed

## 2023-06-29 ENCOUNTER — Encounter: Payer: Self-pay | Admitting: Family

## 2023-06-29 ENCOUNTER — Other Ambulatory Visit: Payer: Self-pay

## 2023-06-29 ENCOUNTER — Ambulatory Visit: Payer: Medicaid Other | Admitting: Family

## 2023-06-29 ENCOUNTER — Ambulatory Visit: Payer: Medicaid Other | Admitting: Gastroenterology

## 2023-06-29 VITALS — BP 114/68 | HR 87 | Temp 98.1°F | Resp 20 | Wt 113.6 lb

## 2023-06-29 DIAGNOSIS — K219 Gastro-esophageal reflux disease without esophagitis: Secondary | ICD-10-CM

## 2023-06-29 DIAGNOSIS — J4530 Mild persistent asthma, uncomplicated: Secondary | ICD-10-CM

## 2023-06-29 DIAGNOSIS — T7800XD Anaphylactic reaction due to unspecified food, subsequent encounter: Secondary | ICD-10-CM

## 2023-06-29 MED ORDER — PULMICORT FLEXHALER 180 MCG/ACT IN AEPB: INHALATION_SPRAY | RESPIRATORY_TRACT | 5 refills | Status: DC

## 2023-06-29 NOTE — Progress Notes (Signed)
[address] [place] [address] Dept: [phone]  FOLLOW UP NOTE  Patient ID: Tina [name], female    DOB: April 01, 1986  Age: 37 y.o. MRN: [id number] Date of Office Visit: 06/29/2023  Assessment  Chief Complaint: Follow-up (6 month check in doing well has not used rescue inhaler since last visit.Marland Kitchen new diagnosis of low back herniated disk in a lot of pain wearinhg a patch and starting physical therapy soon)  HPI Tina [name] is a 37 year old female who presents today for follow-up of mild persistent asthma without complication, seasonal and perennial allergic rhinitis, gastroesophageal reflux disease, and anaphylactic shock due to food.  She was last seen on Nov 23, 2022 by myself.  She reports since her last office visit she has been diagnosed with herniated disc and received 1 round of steroids for this.  Mild persistent asthma: She continues to take Pulmicort Flexhaler 180 mcg 2 puffs twice a day and has albuterol to use as needed.  She denies cough, wheeze, tightness in chest, shortness of breath, and nocturnal awakenings due to breathing problems.  Since her last office visit she has not required any systemic steroids due to her asthma.  She has also not made any trips to the emergency room or urgent care due to breathing problems.  She has not had to use her albuterol inhaler since we last saw her.  Seasonal and perennial allergic rhinitis: She uses saline spray as needed and has not needed Rhinocort nasal spray or used any antihistamines.  She reports nothing out of the ordinary for her allergy symptoms.  She has not been treated for any sinus infections since we last saw her.  Gastroesophageal reflux disease: She reports that she had heartburn the other day that lasted for 30 minutes and went away on its own.  She wonders if it was something that she ate.  Other than that she has not had any major issues with heartburn or reflux like before.  She is currently not  taking any medication for her reflux.  Food allergy: She reports that she did have an accidental ingestion of banana since her last office visit.  She reports both her boyfriend and son have been eating bananas with smoothies and she accidentally took a drink out of the wrong smoothie.  She reports that she tasted the banana, but did not have any symptoms or take any medications.  She is interested in following up on this food allergy.   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 Has not been formally tested for cephalosporin allergy.    Diphenhydramine Hives   Latex Hives and Swelling    Review of Systems: Negative except as per HPI   Physical Exam: BP 114/68   Pulse 87   Temp 98.1 F (36.7 C) (Temporal)   Resp 20   Wt 113 lb 9.6 oz (51.5 kg)   SpO2 100%   BMI 22.94 kg/m    Physical Exam Constitutional:      Appearance: Normal appearance.  HENT:     Head: Normocephalic and atraumatic.     Comments: Pharynx normal, eyes normal, ears normal, nose: Bilateral lower turbinates mildly edematous and slightly erythematous with no drainage noted    Right Ear: Tympanic membrane, ear canal and external ear normal.  Left Ear: Tympanic membrane, ear canal and external ear normal.     Mouth/Throat:     Mouth: Mucous membranes are moist.     Pharynx: Oropharynx is clear.  Eyes:     Conjunctiva/sclera: Conjunctivae normal.  Cardiovascular:     Rate and Rhythm: Regular rhythm.     Heart sounds: Normal heart sounds.  Pulmonary:     Effort: Pulmonary effort is normal.     Breath sounds: Normal breath sounds.     Comments: Lungs clear to auscultation Musculoskeletal:     Cervical back: Neck supple.  Skin:    General: Skin is warm.  Neurological:     Mental Status: She is alert.  Psychiatric:        Mood  and Affect: Mood normal.        Behavior: Behavior normal.        Thought Content: Thought content normal.        Judgment: Judgment normal.     Diagnostics: FVC 2.30 L (83%), FEV1 1.88 L (81%), FEV1/FVC 0.82.  Predicted FVC 2.77 L, predicted FEV1 2.33 L.  Spirometry indicates normal respiratory function.  Skin testing today to banana is negative with adequate controls.  Assessment and Plan: 1. Anaphylactic shock due to food, subsequent encounter   2. Mild persistent asthma without complication   3. Gastroesophageal reflux disease, unspecified whether esophagitis present     Meds ordered this encounter  Medications   budesonide (PULMICORT FLEXHALER) 180 MCG/ACT inhaler    Sig: Inhale 2 puffs twice a day to prevent coughing or wheezing.Rinse mouth out after to help prevent thrush.    Dispense:  1 each    Refill:  5    Please run as brand name. Should not need PA    Patient Instructions  Mild persistent asthma Continue Pulmicort Flexihaler 180 mcg 2 puffs twice a day to help prevent cough and wheeze. This is safe to use while breastfeeding. May use albuterol 2 puffs every 4 hours as needed for cough, wheeze, tightness in chest, or shortness of breath.  This is safe to use while pregnant or breastfeeding  Seasonal and perennial allergic rhinitis May use an over-the-counter any histamine such as Zyrtec (cetirizine), Claritin (loratadine), Xyzal (levocetirizine), or Allegra (fexofenadine) once a day as needed for runny nose May use Rhinocort 1-2 sprays each nostril once a day as needed for stuffy.  This medication is okay to use while breast-feeding and is the corticosteroid of choice while breast-feeding.  Gastroesophageal reflux disease Continue dietary and lifestyle modifications Continue Protonix as per your primary care physician.   Food allergy  Continue to avoid banana. Continue to carry epinephrine auto-injector with you at all times.   Can testing to banana today was  negative.  We will get lab work to follow-up on this food allergy.  We will call you with results once they are back.   Please let us know if this treatment plan is not working well for you Schedule a follow-up appointment in 6 months or sooner if needed  Return in about 6 months (around 12/28/2023), or if symptoms worsen or fail to improve.    Thank you for the opportunity to care for this patient.  Please do not hesitate to contact me with questions.  Nehemiah Settle, FNP [facility]

## 2023-07-03 LAB — ALLERGEN BANANA: Allergen Banana IgE: <0.1 kU/L

## 2023-07-03 NOTE — Progress Notes (Signed)
Please let Tina [name] know that her lab work to banana is negative.  Her skin testing to banana was negative also on June 29, 2023.  If she is interested she can schedule an in office oral food challenge to banana.  If she is not interested in scheduling an in office oral food challenge to banana she needs to continue to avoid bananas and have access to her epinephrine autoinjector device.  If she is interested in the in office oral food challenge to banana she will need to bring a banana with her the day of the appointment.  This appointment will last approximately 2 to 3 hours.  She will need to be off all antihistamines 3 days prior to this appointment and be in good health.  (No recent antibiotics or vaccines.)

## 2023-07-05 ENCOUNTER — Emergency Department (HOSPITAL_BASED_OUTPATIENT_CLINIC_OR_DEPARTMENT_OTHER)
Admission: EM | Admit: 2023-07-05 | Discharge: 2023-07-05 | Disposition: A | Discharge status: Home / Self Care | Payer: Medicaid Other

## 2023-07-05 ENCOUNTER — Ambulatory Visit: Payer: Medicaid Other | Admitting: Rehabilitative and Restorative Service Providers"

## 2023-07-05 ENCOUNTER — Other Ambulatory Visit: Payer: Self-pay

## 2023-07-05 ENCOUNTER — Encounter (HOSPITAL_BASED_OUTPATIENT_CLINIC_OR_DEPARTMENT_OTHER): Payer: Self-pay | Admitting: Emergency Medicine

## 2023-07-05 DIAGNOSIS — J029 Acute pharyngitis, unspecified: Secondary | ICD-10-CM

## 2023-07-05 DIAGNOSIS — B9789 Other viral agents as the cause of diseases classified elsewhere: Secondary | ICD-10-CM | POA: Insufficient documentation

## 2023-07-05 DIAGNOSIS — Z91040 Latex allergy status: Secondary | ICD-10-CM | POA: Diagnosis not present

## 2023-07-05 DIAGNOSIS — J028 Acute pharyngitis due to other specified organisms: Secondary | ICD-10-CM | POA: Insufficient documentation

## 2023-07-05 LAB — GROUP A STREP BY PCR: Group A Strep by PCR: NOT DETECTED

## 2023-07-05 MED ORDER — LIDOCAINE VISCOUS HCL 2 % MT SOLN: 15.0000 mL | OROMUCOSAL | 0 refills | Status: DC | PRN

## 2023-07-05 MED ORDER — IBUPROFEN 800 MG PO TABS
800.0000 mg | ORAL_TABLET | Freq: Once | ORAL | Status: AC
Start: 2023-07-05 — End: 2023-07-05
  Administered 2023-07-05: 800 mg via ORAL
  Filled 2023-07-05: qty 1

## 2023-07-05 MED ORDER — LIDOCAINE VISCOUS HCL 2 % MT SOLN
15.0000 mL | Freq: Once | OROMUCOSAL | Status: AC
  Administered 2023-07-05: 15 mL via OROMUCOSAL
  Filled 2023-07-05: qty 15

## 2023-07-05 NOTE — ED Provider Notes (Signed)
[facility] AT [facility] Provider Note   CSN: [id number] Arrival date & time: 07/05/23  1721     History  Chief Complaint  Patient presents with   Sore Throat    Tina [name] is a 37 y.o. female who presents with sore throat that has been ongoing for past 2 days.  States it is painful to swallow, but is able to swallow without difficulty.  Denies fever or chills at home.  Denies cough, nausea or vomiting, or ear pain.   Sore Throat       Home Medications Prior to Admission medications   Medication Sig Start Date End Date Taking? Authorizing Provider  lidocaine (XYLOCAINE) 2 % solution Use as directed 15 mLs in the mouth or throat as needed for mouth pain. 07/05/23  Yes Arabella Merles, PA-C  albuterol (VENTOLIN HFA) 108 (90 Base) MCG/ACT inhaler 2 puffs every 4-6 hours as needed for coughing or wheezing spells 05/09/22   Nehemiah Settle, FNP  budesonide (PULMICORT FLEXHALER) 180 MCG/ACT inhaler Inhale 2 puffs twice a day to prevent coughing or wheezing.Rinse mouth out after to help prevent thrush. 06/29/23   Nehemiah Settle, FNP  budesonide (RHINOCORT AQUA) 32 MCG/ACT nasal spray Use 2 sprays in each nostril once a day as needed for stuffy nose. 11/23/22   Nehemiah Settle, FNP  cyclobenzaprine (FLEXERIL) 5 MG tablet Take 1 tablet (5 mg total) by mouth 3 (three) times daily as needed for muscle spasms. 05/23/23   Tawkaliyar, Roya, DO  EPINEPHrine (EPIPEN 2-PAK) 0.3 mg/0.3 mL IJ SOAJ injection Use as directed for severe allergic reactions 11/23/22   Nehemiah Settle, FNP  meloxicam (MOBIC) 7.5 MG tablet Take 1 tab every 12 hours as needed. 06/20/23   Everrett Coombe, DO  metaxalone (SKELAXIN) 800 MG tablet Take 1 tablet (800 mg total) by mouth 3 (three) times daily as needed for muscle spasms. 06/20/23   Everrett Coombe, DO  Spacer/Aero-Holding Chambers DEVI 01 spacer 12/09/21   Nehemiah Settle, FNP      Allergies    Banana extract allergy skin  test, Banana, Cefazolin, Diphenhydramine, and Latex    Review of Systems   Review of Systems  Constitutional:  Negative for fatigue.  HENT:  Positive for sore throat.     Physical Exam Updated Vital Signs BP 121/76   Pulse 78   Temp 98.4 F (36.9 C) (Oral)   Resp 18   Wt 49.4 kg   LMP 06/11/2023   SpO2 100%   BMI 22.02 kg/m  Physical Exam Vitals and nursing note reviewed.  Constitutional:      Appearance: Normal appearance.     Comments: Well-appearing, no acute distress  HENT:     Head: Atraumatic.     Right Ear: Tympanic membrane, ear canal and external ear normal.     Left Ear: Tympanic membrane, ear canal and external ear normal.     Mouth/Throat:     Mouth: Mucous membranes are moist.     Comments: Posterior oropharynx mildly erythematous No tonsillar exudate Cardiovascular:     Rate and Rhythm: Normal rate and regular rhythm.  Pulmonary:     Effort: Pulmonary effort is normal.  Neurological:     General: No focal deficit present.     Mental Status: She is alert.  Psychiatric:        Mood and Affect: Mood normal.        Behavior: Behavior normal.     ED Results / Procedures / Treatments  Labs (all labs ordered are listed, but only abnormal results are displayed) Labs Reviewed  GROUP A STREP BY PCR    EKG None  Radiology No results found.  Procedures Procedures    Medications Ordered in ED Medications  lidocaine (XYLOCAINE) 2 % viscous mouth solution 15 mL (has no administration in time range)  ibuprofen (ADVIL) tablet 800 mg (has no administration in time range)    ED Course/ Medical Decision Making/ A&P                                 Medical Decision Making Risk Prescription drug management.     Differential diagnosis includes but is not limited to COVID, flu, RSV, viral URI, strep pharyngitis, viral pharyngitis, allergic rhinitis, pneumonia, bronchitis   ED Course:  Patient well-appearing, stable vital signs.  Sore throat  for 2 days.  She has a slightly erythematous posterior oropharynx, no tonsillar exudate on exam.  Able to swallow without difficulty.  Her strep is negative.  Suspect viral pharyngitis.  States she has been using over-the-counter throat sprays at home without relief of her symptoms.  Was given viscous lidocaine and ibuprofen here for pain.  Stable for discharge home.   Impression: Viral Pharyngitis  Disposition:  The patient was discharged home with instructions to use OTC medications as needed. Viscous lidocaine as prescribed. Ibuprofen as needed for pain.  Return precautions given.               Final Clinical Impression(s) / ED Diagnoses Final diagnoses:  Acute viral pharyngitis    Rx / DC Orders ED Discharge Orders          Ordered    lidocaine (XYLOCAINE) 2 % solution  As needed        07/05/23 1933              [place], [place], Cordelia Poche 07/05/23 1939    Durwin Glaze, MD 07/05/23 2328

## 2023-07-05 NOTE — Discharge Instructions (Addendum)
You appear to have a viral infection causing your sore throat.  It should improve gradually after 5-7 days.  Your strep throat was negative today.  You have been prescribed viscous lidocaine to use as needed for throat pain.  Home care instructions:   You may use up to 800mg  ibuprofen every 8 hours as needed for pain.  Do not exceed 2.4g of ibuprofen per day.   For sore throat: try warm salt water gargles, cepacol lozenges, throat spray, warm tea or water with lemon/honey, popsicles or ice, or OTC cold relief medicine for throat discomfort.    It is important to stay hydrated: drink plenty of fluids (water, gatorade/powerade/pedialyte, juices, or teas) to keep your throat moisturized and help further relieve irritation/discomfort.    Return instructions:  Please return to the Emergency Department if you are unable to swallow, you have fevers not controlled with Tylenol or ibuprofen, uncontrolled vomiting, difficulty breathing, any other new or concerning symptoms.

## 2023-07-05 NOTE — ED Triage Notes (Signed)
Pt c/o difficulty chewing, and reports sore throat, painful to swallow Lt ear pain x 2 days.

## 2023-07-05 NOTE — ED Notes (Signed)
 RN reviewed discharge instructions with pt. Pt verbalized understanding and had no further questions. VSS upon discharge.  

## 2023-07-13 ENCOUNTER — Encounter: Payer: Self-pay | Admitting: Physical Therapy

## 2023-07-13 ENCOUNTER — Ambulatory Visit: Payer: Medicaid Other | Attending: Family Medicine | Admitting: Physical Therapy

## 2023-07-13 ENCOUNTER — Other Ambulatory Visit: Payer: Self-pay

## 2023-07-13 DIAGNOSIS — M6281 Muscle weakness (generalized): Secondary | ICD-10-CM | POA: Insufficient documentation

## 2023-07-13 DIAGNOSIS — M5136 Other intervertebral disc degeneration, lumbar region with discogenic back pain only: Secondary | ICD-10-CM | POA: Diagnosis not present

## 2023-07-13 DIAGNOSIS — M5459 Other low back pain: Secondary | ICD-10-CM

## 2023-07-13 NOTE — Therapy (Signed)
 OUTPATIENT PHYSICAL THERAPY THORACOLUMBAR EVALUATION   Patient Name: Tina [name] MRN: [id number] DOB:11/11/1985, 38 y.o., female Today's Date: 07/13/2023  END OF SESSION:  PT End of Session - 07/13/23 1055     Visit Number 1    Number of Visits 16    Date for PT Re-Evaluation 09/07/23    Authorization Type healthy blue    PT Start Time 1015    PT Stop Time 1050    PT Time Calculation (min) 35 min    Activity Tolerance Patient tolerated treatment well    Behavior During Therapy WFL for tasks assessed/performed             Past Medical History:  Diagnosis Date   Allergy     Asthma    Heart murmur    Past Surgical History:  Procedure Laterality Date   BREAST EXCISIONAL BIOPSY Left 2007   CESAREAN SECTION N/A 10/20/2020   Procedure: CESAREAN SECTION;  Surgeon: Horacio Boas, MD;  Location: [facility] LD ORS;  Service: Obstetrics;  Laterality: N/A;  FHR Decelerations   COLPOSCOPY     LAPAROSCOPY     WISDOM TOOTH EXTRACTION     Patient Active Problem List   Diagnosis Date Noted   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   Abdominal discomfort in flank 04/10/2023   Palpitations 12/21/2022   Temporal pain 08/28/2022   Left lateral abdominal pain 05/30/2022   Sinobronchitis 05/02/2022   Epistaxis 03/15/2022   Migraines 02/02/2022   CTS (carpal tunnel syndrome) 08/25/2020   Cardiac murmur 04/28/2020   Subcutaneous mass 12/23/2019   GERD (gastroesophageal reflux disease) 07/23/2019   Asthma exacerbation 02/19/2019   Well adult exam 02/04/2019   Seasonal and perennial allergic rhinitis 01/22/2019   Food allergy  01/22/2019   Mild persistent asthma 01/14/2019    PCP: Alvia Bring  REFERRING PROVIDER: Alvia Bring  REFERRING DIAG: discogenic back pain  Rationale for Evaluation and Treatment: Rehabilitation  THERAPY DIAG:  Other low back pain  Muscle weakness (generalized)  ONSET DATE: October 2024  SUBJECTIVE:                                                                                                                                                                                            SUBJECTIVE STATEMENT: Pt states that starting in October she began having increasing back pain. She tried meds and stretches but the pain continued. She went to MD who performed a CT scan and diagnosed her with a herniated disc. Pain increases with prolonged sitting or driving, slouching and sleeping on her Lt side. Pain decreases with lidocaine  patches and heating pad  PERTINENT HISTORY:  Mild scoliosis, IBS  PAIN:  Are you having pain? Yes: NPRS scale: 5/10 currently, 9/10 at worst Pain location: left low back Pain description: sore, ache Aggravating factors: slouch, prolonged sitting Relieving factors: heating pad, lidocaine  patches  PRECAUTIONS: None  RED FLAGS: None   WEIGHT BEARING RESTRICTIONS: No  FALLS:  Has patient fallen in last 6 months? No   OCCUPATION: works on a scientific laboratory technician day  PLOF: Independent  PATIENT GOALS: decrease pain and stiffness  NEXT MD VISIT: PRN  OBJECTIVE:  Note: Objective measures were completed at Evaluation unless otherwise noted.  DIAGNOSTIC FINDINGS:  CT 05/2023: 1. No acute fracture or traumatic listhesis of the lumbar spine. 2. Disc bulge and left-greater-than-right facet arthropathy at L5-S1 results in moderate left neural foraminal narrowing. 3. Moderate degenerative changes of the right-greater-than-left sacroiliac joints.  PATIENT SURVEYS:  FOTO 54  COGNITION: Overall cognitive status: Within functional limits for tasks assessed     SENSATION: WFL   POSTURE: anterior pelvic tilt  PALPATION: TTP Lt sacral border, glutes Hypomobile UPAs Lt SIJ  LUMBAR ROM:   AROM eval  Flexion WFL  Extension 50% pain   Right lateral flexion 75% pain  Left lateral flexion 75%  Right rotation WFL  Left rotation WFL pain   (Blank rows = not tested)  LOWER EXTREMITY ROM:     Passive   Right eval Left eval  Hip flexion [facility]  Hip extension    Hip abduction    Hip adduction    Hip internal rotation [facility] WFL  Hip external rotation [facility]  Knee flexion    Knee extension    Ankle dorsiflexion    Ankle plantarflexion    Ankle inversion    Ankle eversion     (Blank rows = not tested)  LOWER EXTREMITY MMT:    MMT Right eval Left eval  Hip flexion 4 4 pain  Hip extension 4- 3  Hip abduction 4 3  Hip adduction    Hip internal rotation    Hip external rotation    Knee flexion    Knee extension    Ankle dorsiflexion    Ankle plantarflexion    Ankle inversion    Ankle eversion     (Blank rows = not tested)  LUMBAR SPECIAL TESTS:  Straight leg raise test: Negative and FABER test: Negative  TREATMENT DATE: 07/13/23 See HEP                                                                                                                                 PATIENT EDUCATION:  Education details: PT POC and goals, HEP Person educated: Patient Education method: Explanation, Demonstration, and Handouts Education comprehension: verbalized understanding and returned demonstration  HOME EXERCISE PROGRAM: Access Code: [email or web address] URL: [email or web address] Date: 07/13/2023 Prepared by: Darice Conine  Exercises - Seated Pelvic Tilt  - 1 x daily - 7 x weekly - 3 sets - 10 reps -  Supine Bridge with Mini Swiss Ball Between Knees  - 1 x daily - 7 x weekly - 3 sets - 10 reps - Hooklying Clamshell with Resistance  - 1 x daily - 7 x weekly - 3 sets - 10 reps  ASSESSMENT:  CLINICAL IMPRESSION: Patient is a 38 y.o. female who was seen today for physical therapy evaluation and treatment for discogenic back pain. She presents with decreased core and LE strength, impaired posture, hypomobility, decreased activity tolerance and increased pain. She will benefit from skilled PT to address deficits and improve functional mobility with decreased pain.   OBJECTIVE  IMPAIRMENTS: decreased activity tolerance, decreased strength, hypomobility, postural dysfunction, and pain.   ACTIVITY LIMITATIONS: sitting  PARTICIPATION LIMITATIONS: driving and occupation  PERSONAL FACTORS: Time since onset of injury/illness/exacerbation and 1 comorbidity: scoliosis  are also affecting patient's functional outcome.   REHAB POTENTIAL: Good  CLINICAL DECISION MAKING: Stable/uncomplicated  EVALUATION COMPLEXITY: Low   GOALS: Goals reviewed with patient? Yes  SHORT TERM GOALS: Target date: 08/10/2023  Pt will be independent in initial HEP Baseline: Goal status: INITIAL  2.  Pt will improve FOTO to >= 60 to demo improving functional mobility Baseline:  Goal status: INITIAL   LONG TERM GOALS: Target date: 09/07/2023    Pt will be independent in advanced HEP Baseline:  Goal status: INITIAL  2.  Pt will improve FOTO to >= 67 to demo improved functional mobility Baseline:  Goal status: INITIAL  3.  Pt will improve bilat LE strength to 4+/5  Baseline:  Goal status: INITIAL  4.  Pt will report pain <= 4/10 at worst Baseline:  Goal status: INITIAL  5.  Pt will tolerate sitting x 1 hour at work with pain <= 2/10 Baseline:  Goal status: INITIAL   PLAN:  PT FREQUENCY: 1-2x/week  PT DURATION: 8 weeks  PLANNED INTERVENTIONS: 97164- PT Re-evaluation, 97110-Therapeutic exercises, 97530- Therapeutic activity, [id number]- Neuromuscular re-education, 97535- Self Care, 02859- Manual therapy, [id number]- Aquatic Therapy, 97014- Electrical stimulation (unattended), [id number]- Ultrasound, 02987- Traction (mechanical), Patient/Family education, Taping, Dry Needling, Cryotherapy, and Moist heat.  PLAN FOR NEXT SESSION: assess response to HEP, SIJ mobility and core strength  For all possible CPT codes, reference the Planned Interventions line above.     Check all conditions that are expected to impact treatment: {Conditions expected to impact treatment:Musculoskeletal  disorders   If treatment provided at initial evaluation, no treatment charged due to lack of authorization.      Rhandi Despain, PT 07/13/2023, 10:56 AM

## 2023-07-17 ENCOUNTER — Ambulatory Visit: Payer: Medicaid Other | Admitting: Gastroenterology

## 2023-07-18 ENCOUNTER — Encounter: Payer: Self-pay | Admitting: Physical Therapy

## 2023-07-18 ENCOUNTER — Ambulatory Visit: Payer: Medicaid Other | Admitting: Physical Therapy

## 2023-07-18 DIAGNOSIS — M6281 Muscle weakness (generalized): Secondary | ICD-10-CM

## 2023-07-18 DIAGNOSIS — M5459 Other low back pain: Secondary | ICD-10-CM

## 2023-07-18 DIAGNOSIS — M5136 Other intervertebral disc degeneration, lumbar region with discogenic back pain only: Secondary | ICD-10-CM | POA: Diagnosis not present

## 2023-07-18 NOTE — Therapy (Signed)
 OUTPATIENT PHYSICAL THERAPY THORACOLUMBAR TREATMENT   Patient Name: Tina [name] MRN: [id number] DOB:December 21, 1985, 38 y.o., female Today's Date: 07/18/2023  END OF SESSION:  PT End of Session - 07/18/23 0927     Visit Number 2    Number of Visits 8    Date for PT Re-Evaluation 09/07/23    Authorization Type healthy blue 7 visits approved through 09/15/23    Authorization Time Period 07/15/23-09/14/33    Authorization - Number of Visits 7    PT Start Time 0930    PT Stop Time 1011    PT Time Calculation (min) 41 min    Activity Tolerance Patient tolerated treatment well    Behavior During Therapy [facility] for tasks assessed/performed              Past Medical History:  Diagnosis Date   Allergy     Asthma    Heart murmur    Past Surgical History:  Procedure Laterality Date   BREAST EXCISIONAL BIOPSY Left 2007   CESAREAN SECTION N/A 10/20/2020   Procedure: CESAREAN SECTION;  Surgeon: Horacio Boas, MD;  Location: [facility] LD ORS;  Service: Obstetrics;  Laterality: N/A;  FHR Decelerations   COLPOSCOPY     LAPAROSCOPY     WISDOM TOOTH EXTRACTION     Patient Active Problem List   Diagnosis Date Noted   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   Abdominal discomfort in flank 04/10/2023   Palpitations 12/21/2022   Temporal pain 08/28/2022   Left lateral abdominal pain 05/30/2022   Sinobronchitis 05/02/2022   Epistaxis 03/15/2022   Migraines 02/02/2022   CTS (carpal tunnel syndrome) 08/25/2020   Cardiac murmur 04/28/2020   Subcutaneous mass 12/23/2019   GERD (gastroesophageal reflux disease) 07/23/2019   Asthma exacerbation 02/19/2019   Well adult exam 02/04/2019   Seasonal and perennial allergic rhinitis 01/22/2019   Food allergy  01/22/2019   Mild persistent asthma 01/14/2019    PCP: Alvia Bring  REFERRING PROVIDER: Alvia Bring  REFERRING DIAG: discogenic back pain  Rationale for Evaluation and Treatment: Rehabilitation  THERAPY DIAG:  Other low back  pain  Muscle weakness (generalized)  ONSET DATE: October 2024  SUBJECTIVE:                                                                                                                                                                                           SUBJECTIVE STATEMENT: Pt states she has performed HEP. She states she sometimes feels uncomfortable but no pain with the exercises. She states the pelvic tilt has helped decrease pain  PERTINENT HISTORY:  Mild scoliosis, IBS  Pt states that starting in October  she began having increasing back pain. She tried meds and stretches but the pain continued. She went to MD who performed a CT scan and diagnosed her with a herniated disc. Pain increases with prolonged sitting or driving, slouching and sleeping on her Lt side. Pain decreases with lidocaine  patches and heating pad  PAIN:  Are you having pain? Yes: NPRS scale: 4/10 currently, 9/10 at worst Pain location: left low back Pain description: sore, ache Aggravating factors: slouch, prolonged sitting Relieving factors: heating pad, lidocaine  patches  PRECAUTIONS: None  RED FLAGS: None   WEIGHT BEARING RESTRICTIONS: No  FALLS:  Has patient fallen in last 6 months? No   OCCUPATION: works on a scientific laboratory technician day  PLOF: Independent  PATIENT GOALS: decrease pain and stiffness  NEXT MD VISIT: PRN  OBJECTIVE:  Note: Objective measures were completed at Evaluation unless otherwise noted.  DIAGNOSTIC FINDINGS:  CT 05/2023: 1. No acute fracture or traumatic listhesis of the lumbar spine. 2. Disc bulge and left-greater-than-right facet arthropathy at L5-S1 results in moderate left neural foraminal narrowing. 3. Moderate degenerative changes of the right-greater-than-left sacroiliac joints.  PATIENT SURVEYS:  FOTO 54    POSTURE: anterior pelvic tilt  PALPATION: TTP Lt sacral border, glutes Hypomobile UPAs Lt SIJ  LUMBAR ROM:   AROM eval  Flexion WFL  Extension  50% pain   Right lateral flexion 75% pain  Left lateral flexion 75%  Right rotation WFL  Left rotation WFL pain   (Blank rows = not tested)  LOWER EXTREMITY ROM:     Passive  Right eval Left eval  Hip flexion [facility]  Hip extension    Hip abduction    Hip adduction    Hip internal rotation [facility] WFL  Hip external rotation [facility]  Knee flexion    Knee extension    Ankle dorsiflexion    Ankle plantarflexion    Ankle inversion    Ankle eversion     (Blank rows = not tested)  LOWER EXTREMITY MMT:    MMT Right eval Left eval  Hip flexion 4 4 pain  Hip extension 4- 3  Hip abduction 4 3  Hip adduction    Hip internal rotation    Hip external rotation    Knee flexion    Knee extension    Ankle dorsiflexion    Ankle plantarflexion    Ankle inversion    Ankle eversion     (Blank rows = not tested)  LUMBAR SPECIAL TESTS:  Straight leg raise test: Negative and FABER test: Negative  [facility] Adult PT Treatment:                                                DATE: 07/18/23 Therapeutic Exercise: Treadmill 1.5 mph x 5 min for warm up Bridge with ball squeeze 2 x 10 Hooklying pelivc tilt x 10 Hooklying oblique isometric with physioball x 10 bilat Hooklying ab isometric with physioball 2 x 10 Hooklying clam shell green TB 2 x 10 Standing hip circle green TB x 10 bilat Hip diagonal green TB x 10 bilat Resisted walking forward/backward 12.5# x 10 Seated on physioball: pelvic tilt A/P, lateral, circles CW/CCW   TREATMENT DATE: 07/13/23 See HEP  PATIENT EDUCATION:  Education details: PT POC and goals, HEP Person educated: Patient Education method: Explanation, Demonstration, and Handouts Education comprehension: verbalized understanding and returned demonstration  HOME EXERCISE PROGRAM: Access Code: [email or web address] URL:  [email or web address] Date: 07/18/2023 Prepared by: Darice Conine  Exercises - Seated Pelvic Tilt  - 1 x daily - 7 x weekly - 3 sets - 10 reps - Supine Bridge with Mini Swiss Ball Between Knees  - 1 x daily - 7 x weekly - 3 sets - 10 reps - Hooklying Clamshell with Resistance  - 1 x daily - 7 x weekly - 3 sets - 10 reps - Abdominal Press into [place]  - 1 x daily - 7 x weekly - 2 sets - 10 reps - Oblique isometric  - 1 x daily - 7 x weekly - 1 sets - 10 reps  ASSESSMENT:  CLINICAL IMPRESSION: Pt with good tolerance to exercises today. Added oblique and ab isometrics to HEP to progress core strength. Pt fatigues quickly with hip strengthening exercises and will benefit from progressing muscular strength and endurance    GOALS: Goals reviewed with patient? Yes  SHORT TERM GOALS: Target date: 08/10/2023  Pt will be independent in initial HEP Baseline: Goal status: INITIAL  2.  Pt will improve FOTO to >= 60 to demo improving functional mobility Baseline:  Goal status: INITIAL   LONG TERM GOALS: Target date: 09/07/2023    Pt will be independent in advanced HEP Baseline:  Goal status: INITIAL  2.  Pt will improve FOTO to >= 67 to demo improved functional mobility Baseline:  Goal status: INITIAL  3.  Pt will improve bilat LE strength to 4+/5  Baseline:  Goal status: INITIAL  4.  Pt will report pain <= 4/10 at worst Baseline:  Goal status: INITIAL  5.  Pt will tolerate sitting x 1 hour at work with pain <= 2/10 Baseline:  Goal status: INITIAL   PLAN:  PT FREQUENCY: 1-2x/week  PT DURATION: 8 weeks  PLANNED INTERVENTIONS: 97164- PT Re-evaluation, 97110-Therapeutic exercises, 97530- Therapeutic activity, [id number]- Neuromuscular re-education, 97535- Self Care, 02859- Manual therapy, [id number]- Aquatic Therapy, 97014- Electrical stimulation (unattended), [id number]- Ultrasound, 02987- Traction (mechanical), Patient/Family education, Taping, Dry Needling, Cryotherapy,  and Moist heat.  Next Visit: core strength, hip strength, try planks?   Ricci Dirocco, PT 07/18/2023, 10:11 AM

## 2023-07-25 ENCOUNTER — Encounter: Payer: Self-pay | Admitting: Physical Therapy

## 2023-07-25 ENCOUNTER — Ambulatory Visit: Payer: Medicaid Other | Admitting: Physical Therapy

## 2023-07-25 DIAGNOSIS — M5136 Other intervertebral disc degeneration, lumbar region with discogenic back pain only: Secondary | ICD-10-CM | POA: Diagnosis not present

## 2023-07-25 DIAGNOSIS — M5459 Other low back pain: Secondary | ICD-10-CM

## 2023-07-25 DIAGNOSIS — M6281 Muscle weakness (generalized): Secondary | ICD-10-CM

## 2023-07-25 NOTE — Therapy (Signed)
 OUTPATIENT PHYSICAL THERAPY THORACOLUMBAR TREATMENT   Patient Name: Tina [name] MRN: [id number] DOB:03-29-1986, 38 y.o., female Today's Date: 07/25/2023  END OF SESSION:  PT End of Session - 07/25/23 1009     Visit Number 3    Number of Visits 8    Date for PT Re-Evaluation 09/07/23    Authorization Type healthy blue 7 visits approved through 09/15/23    Authorization Time Period 07/15/23-09/14/33    Authorization - Visit Number 2    Authorization - Number of Visits 7    PT Start Time 0930    PT Stop Time 1009    PT Time Calculation (min) 39 min    Activity Tolerance Patient tolerated treatment well    Behavior During Therapy [facility] for tasks assessed/performed               Past Medical History:  Diagnosis Date   Allergy     Asthma    Heart murmur    Past Surgical History:  Procedure Laterality Date   BREAST EXCISIONAL BIOPSY Left 2007   CESAREAN SECTION N/A 10/20/2020   Procedure: CESAREAN SECTION;  Surgeon: Cyd Dowse, MD;  Location: [facility] LD ORS;  Service: Obstetrics;  Laterality: N/A;  FHR Decelerations   COLPOSCOPY     LAPAROSCOPY     WISDOM TOOTH EXTRACTION     Patient Active Problem List   Diagnosis Date Noted   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   Abdominal discomfort in flank 04/10/2023   Palpitations 12/21/2022   Temporal pain 08/28/2022   Left lateral abdominal pain 05/30/2022   Sinobronchitis 05/02/2022   Epistaxis 03/15/2022   Migraines 02/02/2022   CTS (carpal tunnel syndrome) 08/25/2020   Cardiac murmur 04/28/2020   Subcutaneous mass 12/23/2019   GERD (gastroesophageal reflux disease) 07/23/2019   Asthma exacerbation 02/19/2019   Well adult exam 02/04/2019   Seasonal and perennial allergic rhinitis 01/22/2019   Food allergy  01/22/2019   Mild persistent asthma 01/14/2019    PCP: Adela Holter  REFERRING PROVIDER: Adela Holter  REFERRING DIAG: discogenic back pain  Rationale for Evaluation and Treatment:  Rehabilitation  THERAPY DIAG:  Other low back pain  Muscle weakness (generalized)  ONSET DATE: October 2024  SUBJECTIVE:                                                                                                                                                                                           SUBJECTIVE STATEMENT: Pt states her back is feeling a lot better. She still has pain on her Lt side  PERTINENT HISTORY:  Mild scoliosis, IBS  Pt states that starting in October she  began having increasing back pain. She tried meds and stretches but the pain continued. She went to MD who performed a CT scan and diagnosed her with a herniated disc. Pain increases with prolonged sitting or driving, slouching and sleeping on her Lt side. Pain decreases with lidocaine  patches and heating pad  PAIN:  Are you having pain? Yes: NPRS scale: 2/10 currently, 9/10 at worst Pain location: left low back Pain description: sore, ache Aggravating factors: slouch, prolonged sitting Relieving factors: heating pad, lidocaine  patches  PRECAUTIONS: None  RED FLAGS: None   WEIGHT BEARING RESTRICTIONS: No  FALLS:  Has patient fallen in last 6 months? 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  DIAGNOSTIC FINDINGS:  CT 05/2023: 1. No acute fracture or traumatic listhesis of the lumbar spine. 2. Disc bulge and left-greater-than-right facet arthropathy at L5-S1 results in moderate left neural foraminal narrowing. 3. Moderate degenerative changes of the right-greater-than-left sacroiliac joints.  PATIENT SURVEYS:  FOTO 54    POSTURE: anterior pelvic tilt  PALPATION: TTP Lt sacral border, glutes Hypomobile UPAs Lt SIJ  LUMBAR ROM:   AROM eval  Flexion WFL  Extension 50% pain   Right lateral flexion  75% pain  Left lateral flexion 75%  Right rotation WFL  Left rotation WFL pain   (Blank rows = not tested)  LOWER EXTREMITY ROM:     Passive  Right eval Left eval  Hip flexion [facility]  Hip extension    Hip abduction    Hip adduction    Hip internal rotation [facility] WFL  Hip external rotation [facility]  Knee flexion    Knee extension    Ankle dorsiflexion    Ankle plantarflexion    Ankle inversion    Ankle eversion     (Blank rows = not tested)  LOWER EXTREMITY MMT:    MMT Right eval Left eval  Hip flexion 4 4 pain  Hip extension 4- 3  Hip abduction 4 3  Hip adduction    Hip internal rotation    Hip external rotation    Knee flexion    Knee extension    Ankle dorsiflexion    Ankle plantarflexion    Ankle inversion    Ankle eversion     (Blank rows = not tested)  LUMBAR SPECIAL TESTS:  Straight leg raise test: Negative and FABER test: Negative  [facility] Adult PT Treatment:                                                DATE: 07/25/23 Therapeutic Exercise: Seated on physioball: pelvic tilt A/P, lateral, circles CW/CCW x 4 minutes for warm up Hooklying oblique isometric with physioball x 10 bilat Hooklying ab isometric with physioball 2 x 10 Standing hip circles on slider 2 x 10 Hip diagonals green TB 2 x 10 bilat Elevated plank 3 x 20 sec Side plank on wall 2 x 20 sec Slow march with 4# DB hold overhead 2 x 10 STS holding 8# 2 x 10   [facility] Adult PT Treatment:  DATE: 07/18/23 Therapeutic Exercise: Treadmill 1.5 mph x 5 min for warm up Bridge with ball squeeze 2 x 10 Hooklying pelivc tilt x 10 Hooklying oblique isometric with physioball x 10 bilat Hooklying ab isometric with physioball 2 x 10 Hooklying clam shell green TB 2 x 10 Standing hip circle green TB x 10 bilat Hip diagonal green TB x 10 bilat Resisted walking forward/backward 12.5# x 10 Seated on physioball: pelvic tilt A/P, lateral, circles  CW/CCW   TREATMENT DATE: 07/13/23 See HEP                                                                                                                                 PATIENT EDUCATION:  Education details: PT POC and goals, HEP Person educated: Patient Education method: Explanation, Demonstration, and Handouts Education comprehension: verbalized understanding and returned demonstration  HOME EXERCISE PROGRAM: Access Code: [email or web address] URL: [email or web address] Date: 07/18/2023 Prepared by: Lowery Rue  Exercises - Seated Pelvic Tilt  - 1 x daily - 7 x weekly - 3 sets - 10 reps - Supine Bridge with Mini Swiss Ball Between Knees  - 1 x daily - 7 x weekly - 3 sets - 10 reps - Hooklying Clamshell with Resistance  - 1 x daily - 7 x weekly - 3 sets - 10 reps - Abdominal Press into [place]  - 1 x daily - 7 x weekly - 2 sets - 10 reps - Oblique isometric  - 1 x daily - 7 x weekly - 1 sets - 10 reps  ASSESSMENT:  CLINICAL IMPRESSION: Pt requires cues for proper form with planks. No increased pain during session. Continued to progress hip and core endurance as well as strength.    GOALS: Goals reviewed with patient? Yes  SHORT TERM GOALS: Target date: 08/10/2023  Pt will be independent in initial HEP Baseline: Goal status: MET  2.  Pt will improve FOTO to >= 60 to demo improving functional mobility Baseline:  Goal status: INITIAL   LONG TERM GOALS: Target date: 09/07/2023    Pt will be independent in advanced HEP Baseline:  Goal status: INITIAL  2.  Pt will improve FOTO to >= 67 to demo improved functional mobility Baseline:  Goal status: INITIAL  3.  Pt will improve bilat LE strength to 4+/5  Baseline:  Goal status: INITIAL  4.  Pt will report pain <= 4/10 at worst Baseline:  Goal status: INITIAL  5.  Pt will tolerate sitting x 1 hour at work with pain <= 2/10 Baseline:  Goal status: INITIAL   PLAN:  PT FREQUENCY: 1-2x/week  PT DURATION:  8 weeks  PLANNED INTERVENTIONS: 97164- PT Re-evaluation, 97110-Therapeutic exercises, 97530- Therapeutic activity, [id number]- Neuromuscular re-education, 97535- Self Care, 16109- Manual therapy, [id number]- Aquatic Therapy, 97014- Electrical stimulation (unattended), [id number]- Ultrasound, 60454- Traction (mechanical), Patient/Family education, Taping, Dry Needling, Cryotherapy, and Moist heat.  Next Visit: add planks to HEP, quadruped activities core strength, hip strength  Renaye Janicki, PT 07/25/2023, 10:11 AM

## 2023-08-01 ENCOUNTER — Ambulatory Visit: Payer: Medicaid Other | Admitting: Physical Therapy

## 2023-08-01 ENCOUNTER — Encounter: Payer: Self-pay | Admitting: Physical Therapy

## 2023-08-01 DIAGNOSIS — M5136 Other intervertebral disc degeneration, lumbar region with discogenic back pain only: Secondary | ICD-10-CM | POA: Diagnosis not present

## 2023-08-01 DIAGNOSIS — M6281 Muscle weakness (generalized): Secondary | ICD-10-CM

## 2023-08-01 DIAGNOSIS — M5459 Other low back pain: Secondary | ICD-10-CM

## 2023-08-01 NOTE — Therapy (Signed)
OUTPATIENT PHYSICAL THERAPY THORACOLUMBAR TREATMENT   Patient Name: Tina [name] MRN: [id number] DOB:[birth year], 38 y.o., female Today's Date: 08/01/2023  END OF SESSION:  PT End of Session - 08/01/23 1012     Visit Number 4    Number of Visits 8    Date for PT Re-Evaluation 09/07/23    Authorization Type healthy blue 7 visits approved through 09/15/23    Authorization Time Period 07/15/23-09/14/33    Authorization - Visit Number 4    Authorization - Number of Visits 7    PT Start Time 0930    PT Stop Time 1009    PT Time Calculation (min) 39 min    Activity Tolerance Patient tolerated treatment well    Behavior During Therapy [facility] for tasks assessed/performed                Past Medical History:  Diagnosis Date   Allergy    Asthma    Heart murmur    Past Surgical History:  Procedure Laterality Date   BREAST EXCISIONAL BIOPSY Left 2007   CESAREAN SECTION N/A 10/20/2020   Procedure: CESAREAN SECTION;  Surgeon: Lavina Hamman, MD;  Location: [facility] LD ORS;  Service: Obstetrics;  Laterality: N/A;  FHR Decelerations   COLPOSCOPY     LAPAROSCOPY     WISDOM TOOTH EXTRACTION     Patient Active Problem List   Diagnosis Date Noted   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   Abdominal discomfort in flank 04/10/2023   Palpitations 12/21/2022   Temporal pain 08/28/2022   Left lateral abdominal pain 05/30/2022   Sinobronchitis 05/02/2022   Epistaxis 03/15/2022   Migraines 02/02/2022   CTS (carpal tunnel syndrome) 08/25/2020   Cardiac murmur 04/28/2020   Subcutaneous mass 12/23/2019   GERD (gastroesophageal reflux disease) 07/23/2019   Asthma exacerbation 02/19/2019   Well adult exam 02/04/2019   Seasonal and perennial allergic rhinitis 01/22/2019   Food allergy 01/22/2019   Mild persistent asthma 01/14/2019    PCP: Everrett Coombe  REFERRING PROVIDER: Everrett Coombe  REFERRING DIAG: discogenic back pain  Rationale for Evaluation and Treatment:  Rehabilitation  THERAPY DIAG:  Other low back pain  Muscle weakness (generalized)  ONSET DATE: October 2024  SUBJECTIVE:                                                                                                                                                                                           SUBJECTIVE STATEMENT: Pt states her back is feeling a lot better.   PERTINENT HISTORY:  Mild scoliosis, IBS  Pt states that starting in October she began having increasing back pain. She  tried meds and stretches but the pain continued. She went to MD who performed a CT scan and diagnosed her with a herniated disc. Pain increases with prolonged sitting or driving, slouching and sleeping on her Lt side. Pain decreases with lidocaine patches and heating pad  PAIN:  Are you having pain? Yes: NPRS scale: 1/10 currently, 9/10 at worst Pain location: left low back Pain description: sore, ache Aggravating factors: slouch, prolonged sitting Relieving factors: heating pad, lidocaine patches  PRECAUTIONS: None  RED FLAGS: None   WEIGHT BEARING RESTRICTIONS: No  FALLS:  Has patient fallen in last 6 months? 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  DIAGNOSTIC FINDINGS:  CT 05/2023: 1. No acute fracture or traumatic listhesis of the lumbar spine. 2. Disc bulge and left-greater-than-right facet arthropathy at L5-S1 results in moderate left neural foraminal narrowing. 3. Moderate degenerative changes of the right-greater-than-left sacroiliac joints.  PATIENT SURVEYS:  FOTO 54    POSTURE: anterior pelvic tilt  PALPATION: TTP Lt sacral border, glutes Hypomobile UPAs Lt SIJ  LUMBAR ROM:   AROM eval  Flexion WFL  Extension 50% pain   Right lateral flexion 75% pain  Left lateral flexion 75%   Right rotation WFL  Left rotation WFL pain   (Blank rows = not tested)  LOWER EXTREMITY ROM:     Passive  Right eval Left eval  Hip flexion [facility]  Hip extension    Hip abduction    Hip adduction    Hip internal rotation [facility]  Hip external rotation [facility]  Knee flexion    Knee extension    Ankle dorsiflexion    Ankle plantarflexion    Ankle inversion    Ankle eversion     (Blank rows = not tested)  LOWER EXTREMITY MMT:    MMT Right eval Left eval  Hip flexion 4 4 pain  Hip extension 4- 3  Hip abduction 4 3  Hip adduction    Hip internal rotation    Hip external rotation    Knee flexion    Knee extension    Ankle dorsiflexion    Ankle plantarflexion    Ankle inversion    Ankle eversion     (Blank rows = not tested)  LUMBAR SPECIAL TESTS:  Straight leg raise test: Negative and FABER test: Negative  [facility] Adult PT Treatment:                                                DATE: 08/01/23 Therapeutic Exercise: Treadmill 1. grade 2 x 5 min for warm up Seated on physioball: pelvic tilt A/P, lateral, circles CW/CCW Quadruped: hip ext x 10 bilat, bird dog x 10 bilat - cues for neutral spine Slow march with 4# DB hold overhead 2 x 10 Sit to stand 10# KB 2 x 10 Plank on elevated table 3 x 20 sec Suitcase carry 10# x 2 laps Pallof press 5# x 10 bilat Bilat shoulder extension with bar at matrix machine 10# x 10   [facility] Adult PT Treatment:  DATE: 07/25/23 Therapeutic Exercise: Seated on physioball: pelvic tilt A/P, lateral, circles CW/CCW x 4 minutes for warm up Hooklying oblique isometric with physioball x 10 bilat Hooklying ab isometric with physioball 2 x 10 Standing hip circles on slider 2 x 10 Hip diagonals green TB 2 x 10 bilat Elevated plank 3 x 20 sec Side plank on wall 2 x 20 sec Slow march with 4# DB hold overhead 2 x 10 STS holding 8# 2 x 10   [facility] Adult PT Treatment:                                                 DATE: 07/18/23 Therapeutic Exercise: Treadmill 1.5 mph x 5 min for warm up Bridge with ball squeeze 2 x 10 Hooklying pelivc tilt x 10 Hooklying oblique isometric with physioball x 10 bilat Hooklying ab isometric with physioball 2 x 10 Hooklying clam shell green TB 2 x 10 Standing hip circle green TB x 10 bilat Hip diagonal green TB x 10 bilat Resisted walking forward/backward 12.5# x 10 Seated on physioball: pelvic tilt A/P, lateral, circles CW/CCW    PATIENT EDUCATION:  Education details: PT POC and goals, HEP Person educated: Patient Education method: Explanation, Demonstration, and Handouts Education comprehension: verbalized understanding and returned demonstration  HOME EXERCISE PROGRAM: Access Code: [facility] URL: [email or web address] Date: 08/01/2023 Prepared by: Reggy Eye  Exercises - Seated Pelvic Tilt  - 1 x daily - 7 x weekly - 3 sets - 10 reps - Supine Bridge with Mini Swiss Ball Between Knees  - 1 x daily - 7 x weekly - 3 sets - 10 reps - Hooklying Clamshell with Resistance  - 1 x daily - 7 x weekly - 3 sets - 10 reps - Abdominal Press into [place]  - 1 x daily - 7 x weekly - 2 sets - 10 reps - Oblique isometric  - 1 x daily - 7 x weekly - 1 sets - 10 reps - Plank with Hands on Table  - 1 x daily - 7 x weekly - 3 sets - 1 reps - 20-30 seconds hold - Bird Dog  - 1 x daily - 7 x weekly - 3 sets - 10 reps  ASSESSMENT:  CLINICAL IMPRESSION: Updated HEP to include quadruped exercises and planks. Pt challenged by strengthening at matrix machine. Pt is progressing well towards goals   GOALS: Goals reviewed with patient? Yes  SHORT TERM GOALS: Target date: 08/10/2023  Pt will be independent in initial HEP Baseline: Goal status: MET  2.  Pt will improve FOTO to >= 60 to demo improving functional mobility Baseline:  Goal status: INITIAL   LONG TERM GOALS: Target date: 09/07/2023    Pt will be independent in advanced  HEP Baseline:  Goal status: INITIAL  2.  Pt will improve FOTO to >= 67 to demo improved functional mobility Baseline:  Goal status: INITIAL  3.  Pt will improve bilat LE strength to 4+/5  Baseline:  Goal status: INITIAL  4.  Pt will report pain <= 4/10 at worst Baseline:  Goal status: INITIAL  5.  Pt will tolerate sitting x 1 hour at work with pain <= 2/10 Baseline:  Goal status: INITIAL   PLAN:  PT FREQUENCY: 1-2x/week  PT DURATION: 8 weeks  PLANNED INTERVENTIONS: 97164- PT Re-evaluation, 97110-Therapeutic exercises, 97530- Therapeutic activity, [id number]- Neuromuscular  re-education, [phone]- Self Care, 30865- Manual therapy, [id number]- Aquatic Therapy, 97014- Electrical stimulation (unattended), [id number]- Ultrasound, [id number]- Traction (mechanical), Patient/Family education, Taping, Dry Needling, Cryotherapy, and Moist heat.  Next Visit: core strength, hip strength   Kita Neace, PT 08/01/2023, 10:13 AM

## 2023-08-08 ENCOUNTER — Ambulatory Visit: Payer: Medicaid Other | Admitting: Physical Therapy

## 2023-08-08 ENCOUNTER — Encounter: Payer: Self-pay | Admitting: Physical Therapy

## 2023-08-08 DIAGNOSIS — M6281 Muscle weakness (generalized): Secondary | ICD-10-CM

## 2023-08-08 DIAGNOSIS — M5459 Other low back pain: Secondary | ICD-10-CM

## 2023-08-08 DIAGNOSIS — M5136 Other intervertebral disc degeneration, lumbar region with discogenic back pain only: Secondary | ICD-10-CM | POA: Diagnosis not present

## 2023-08-08 NOTE — Therapy (Signed)
OUTPATIENT PHYSICAL THERAPY THORACOLUMBAR TREATMENT   Patient Name: Tina [name] MRN: [id number] DOB:09/23/1985, 38 y.o., female Today's Date: 08/08/2023  END OF SESSION:  PT End of Session - 08/08/23 1006     Visit Number 5    Number of Visits 8    Date for PT Re-Evaluation 09/07/23    Authorization Type healthy blue 7 visits approved through 09/15/23    Authorization Time Period 07/15/23-09/14/33    Authorization - Visit Number 5    Authorization - Number of Visits 7    PT Start Time 0929    PT Stop Time 1007    PT Time Calculation (min) 38 min    Activity Tolerance Patient tolerated treatment well    Behavior During Therapy [facility] for tasks assessed/performed                 Past Medical History:  Diagnosis Date   Allergy    Asthma    Heart murmur    Past Surgical History:  Procedure Laterality Date   BREAST EXCISIONAL BIOPSY Left 2007   CESAREAN SECTION N/A 10/20/2020   Procedure: CESAREAN SECTION;  Surgeon: Lavina Hamman, MD;  Location: [facility] LD ORS;  Service: Obstetrics;  Laterality: N/A;  FHR Decelerations   COLPOSCOPY     LAPAROSCOPY     WISDOM TOOTH EXTRACTION     Patient Active Problem List   Diagnosis Date Noted   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   Abdominal discomfort in flank 04/10/2023   Palpitations 12/21/2022   Temporal pain 08/28/2022   Left lateral abdominal pain 05/30/2022   Sinobronchitis 05/02/2022   Epistaxis 03/15/2022   Migraines 02/02/2022   CTS (carpal tunnel syndrome) 08/25/2020   Cardiac murmur 04/28/2020   Subcutaneous mass 12/23/2019   GERD (gastroesophageal reflux disease) 07/23/2019   Asthma exacerbation 02/19/2019   Well adult exam 02/04/2019   Seasonal and perennial allergic rhinitis 01/22/2019   Food allergy 01/22/2019   Mild persistent asthma 01/14/2019    PCP: Everrett Coombe  REFERRING PROVIDER: Everrett Coombe  REFERRING DIAG: discogenic back pain  Rationale for Evaluation and Treatment:  Rehabilitation  THERAPY DIAG:  Other low back pain  Muscle weakness (generalized)  ONSET DATE: October 2024  SUBJECTIVE:                                                                                                                                                                                           SUBJECTIVE STATEMENT: Pt states she was sitting a lot at work this past week and had more back pain  PERTINENT HISTORY:  Mild scoliosis, IBS  Pt states that starting in  October she began having increasing back pain. She tried meds and stretches but the pain continued. She went to MD who performed a CT scan and diagnosed her with a herniated disc. Pain increases with prolonged sitting or driving, slouching and sleeping on her Lt side. Pain decreases with lidocaine patches and heating pad  PAIN:  Are you having pain? Yes: NPRS scale: 4/10 currently, 9/10 at worst Pain location: left low back Pain description: sore, ache Aggravating factors: slouch, prolonged sitting Relieving factors: heating pad, lidocaine patches  PRECAUTIONS: None  RED FLAGS: None   WEIGHT BEARING RESTRICTIONS: No  FALLS:  Has patient fallen in last 6 months? 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  DIAGNOSTIC FINDINGS:  CT 05/2023: 1. No acute fracture or traumatic listhesis of the lumbar spine. 2. Disc bulge and left-greater-than-right facet arthropathy at L5-S1 results in moderate left neural foraminal narrowing. 3. Moderate degenerative changes of the right-greater-than-left sacroiliac joints.  PATIENT SURVEYS:  FOTO 54    POSTURE: anterior pelvic tilt  PALPATION: TTP Lt sacral border, glutes Hypomobile UPAs Lt SIJ  LUMBAR ROM:   AROM eval  Flexion WFL  Extension 50% pain   Right lateral flexion  75% pain  Left lateral flexion 75%  Right rotation WFL  Left rotation WFL pain   (Blank rows = not tested)  LOWER EXTREMITY ROM:     Passive  Right eval Left eval  Hip flexion [facility]  Hip extension    Hip abduction    Hip adduction    Hip internal rotation [facility]  Hip external rotation [facility]  Knee flexion    Knee extension    Ankle dorsiflexion    Ankle plantarflexion    Ankle inversion    Ankle eversion     (Blank rows = not tested)  LOWER EXTREMITY MMT:    MMT Right eval Left eval  Hip flexion 4 4 pain  Hip extension 4- 3  Hip abduction 4 3  Hip adduction    Hip internal rotation    Hip external rotation    Knee flexion    Knee extension    Ankle dorsiflexion    Ankle plantarflexion    Ankle inversion    Ankle eversion     (Blank rows = not tested)  LUMBAR SPECIAL TESTS:  Straight leg raise test: Negative and FABER test: Negative  [facility] Adult PT Treatment:                                                DATE: 08/08/23 Therapeutic Exercise: Treadmill 1. grade 2 x 5 min for warm up Quadruped: hip ext x 10 bilat, bird dog x 10 bilat - cues for neutral spine Seated on physioball: pelvic tilt A/P, lateral, circles CW/CCW Childs pose front and laterally all x 20 sec Pallof press 5# x 10 bilat Supine piriformis stretch 2 x 20 sec Lt LTR x 10 Seated pelvic tilt  Therapeutic Activity: FOTO: 63 Education on progress towards goals and continuing POC   [facility] Adult PT Treatment:  DATE: 08/01/23 Therapeutic Exercise: Treadmill 1. grade 2 x 5 min for warm up Seated on physioball: pelvic tilt A/P, lateral, circles CW/CCW Quadruped: hip ext x 10 bilat, bird dog x 10 bilat - cues for neutral spine Slow march with 4# DB hold overhead 2 x 10 Sit to stand 10# KB 2 x 10 Plank on elevated table 3 x 20 sec Suitcase carry 10# x 2 laps Pallof press 5# x 10 bilat Bilat shoulder extension with bar at matrix machine  10# x 10   [facility] Adult PT Treatment:                                                DATE: 07/25/23 Therapeutic Exercise: Seated on physioball: pelvic tilt A/P, lateral, circles CW/CCW x 4 minutes for warm up Hooklying oblique isometric with physioball x 10 bilat Hooklying ab isometric with physioball 2 x 10 Standing hip circles on slider 2 x 10 Hip diagonals green TB 2 x 10 bilat Elevated plank 3 x 20 sec Side plank on wall 2 x 20 sec Slow march with 4# DB hold overhead 2 x 10 STS holding 8# 2 x 10    PATIENT EDUCATION:  Education details: PT POC and goals, HEP Person educated: Patient Education method: Explanation, Demonstration, and Handouts Education comprehension: verbalized understanding and returned demonstration  HOME EXERCISE PROGRAM: Access Code: [email or web address] URL: [email or web address] Date: 08/01/2023 Prepared by: Reggy Eye  Exercises - Seated Pelvic Tilt  - 1 x daily - 7 x weekly - 3 sets - 10 reps - Supine Bridge with Mini Swiss Ball Between Knees  - 1 x daily - 7 x weekly - 3 sets - 10 reps - Hooklying Clamshell with Resistance  - 1 x daily - 7 x weekly - 3 sets - 10 reps - Abdominal Press into [place]  - 1 x daily - 7 x weekly - 2 sets - 10 reps - Oblique isometric  - 1 x daily - 7 x weekly - 1 sets - 10 reps - Plank with Hands on Table  - 1 x daily - 7 x weekly - 3 sets - 1 reps - 20-30 seconds hold - Bird Dog  - 1 x daily - 7 x weekly - 3 sets - 10 reps  ASSESSMENT:  CLINICAL IMPRESSION: Treatment focused more on stretching today due to c/o increased back pain. Pt with good response to glute and piriformis stretching. Pt improved FOTO and is progressing towards LTGs.   GOALS: Goals reviewed with patient? Yes  SHORT TERM GOALS: Target date: 08/10/2023  Pt will be independent in initial HEP Baseline: Goal status: MET  2.  Pt will improve FOTO to >= 60 to demo improving functional mobility Baseline:  Goal status: MET   LONG TERM GOALS:  Target date: 09/07/2023    Pt will be independent in advanced HEP Baseline:  Goal status: INITIAL  2.  Pt will improve FOTO to >= 67 to demo improved functional mobility Baseline:  Goal status: INITIAL  3.  Pt will improve bilat LE strength to 4+/5  Baseline:  Goal status: INITIAL  4.  Pt will report pain <= 4/10 at worst Baseline:  Goal status: INITIAL  5.  Pt will tolerate sitting x 1 hour at work with pain <= 2/10 Baseline:  Goal status: INITIAL   PLAN:  PT FREQUENCY: 1-2x/week  PT DURATION: 8 weeks  PLANNED INTERVENTIONS: 97164- PT Re-evaluation, 97110-Therapeutic exercises, 97530- Therapeutic activity, [id number]- Neuromuscular re-education, 97535- Self Care, 29562- Manual therapy, [id number]- Aquatic Therapy, 97014- Electrical stimulation (unattended), [id number]- Ultrasound, [id number]- Traction (mechanical), Patient/Family education, Taping, Dry Needling, Cryotherapy, and Moist heat.  Next Visit: core strength, hip strength   Juliani Laduke, PT 08/08/2023, 10:07 AM

## 2023-08-15 ENCOUNTER — Encounter: Payer: Self-pay | Admitting: Physical Therapy

## 2023-08-15 ENCOUNTER — Ambulatory Visit: Payer: Medicaid Other | Attending: Family Medicine | Admitting: Physical Therapy

## 2023-08-15 DIAGNOSIS — M6281 Muscle weakness (generalized): Secondary | ICD-10-CM | POA: Diagnosis present

## 2023-08-15 DIAGNOSIS — M5459 Other low back pain: Secondary | ICD-10-CM | POA: Insufficient documentation
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
## 2023-08-22 ENCOUNTER — Ambulatory Visit: Payer: Medicaid Other | Admitting: Physical Therapy

## 2023-08-22 ENCOUNTER — Ambulatory Visit: Payer: Medicaid Other | Admitting: Family Medicine

## 2023-08-22 ENCOUNTER — Encounter: Payer: Self-pay | Admitting: Physical Therapy

## 2023-08-22 DIAGNOSIS — M5459 Other low back pain: Secondary | ICD-10-CM

## 2023-08-22 DIAGNOSIS — M6281 Muscle weakness (generalized): Secondary | ICD-10-CM

## 2023-08-22 NOTE — Therapy (Signed)
OUTPATIENT PHYSICAL THERAPY THORACOLUMBAR TREATMENT AND DISCHARGE   Patient Name: Tina [name] MRN: [id number] DOB:1985-10-04, 38 y.o., female Today's Date: 08/22/2023  END OF SESSION:  PT End of Session - 08/22/23 1016     Visit Number 7    Date for PT Re-Evaluation 09/07/23    Authorization Type healthy blue 7 visits approved through 09/15/23    Authorization Time Period 07/15/23-09/14/33    Authorization - Visit Number 7    Authorization - Number of Visits 7    PT Start Time 0930    PT Stop Time 1009    PT Time Calculation (min) 39 min    Activity Tolerance Patient tolerated treatment well    Behavior During Therapy [facility] for tasks assessed/performed                   Past Medical History:  Diagnosis Date   Allergy    Asthma    Heart murmur    Past Surgical History:  Procedure Laterality Date   BREAST EXCISIONAL BIOPSY Left 2007   CESAREAN SECTION N/A 10/20/2020   Procedure: CESAREAN SECTION;  Surgeon: Lavina Hamman, MD;  Location: [facility] LD ORS;  Service: Obstetrics;  Laterality: N/A;  FHR Decelerations   COLPOSCOPY     LAPAROSCOPY     WISDOM TOOTH EXTRACTION     Patient Active Problem List   Diagnosis Date Noted   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   Abdominal discomfort in flank 04/10/2023   Palpitations 12/21/2022   Temporal pain 08/28/2022   Left lateral abdominal pain 05/30/2022   Sinobronchitis 05/02/2022   Epistaxis 03/15/2022   Migraines 02/02/2022   CTS (carpal tunnel syndrome) 08/25/2020   Cardiac murmur 04/28/2020   Subcutaneous mass 12/23/2019   GERD (gastroesophageal reflux disease) 07/23/2019   Asthma exacerbation 02/19/2019   Well adult exam 02/04/2019   Seasonal and perennial allergic rhinitis 01/22/2019   Food allergy 01/22/2019   Mild persistent asthma 01/14/2019    PCP: Everrett Coombe  REFERRING PROVIDER: Everrett Coombe  REFERRING DIAG: discogenic back pain  Rationale for Evaluation and Treatment:  Rehabilitation  THERAPY DIAG:  Other low back pain  Muscle weakness (generalized)  ONSET DATE: October 2024  SUBJECTIVE:                                                                                                                                                                                           SUBJECTIVE STATEMENT: Pt reports she feels better than last week. She has been walking more at work  PERTINENT HISTORY:  Mild scoliosis, IBS  Pt states that starting in October she began having increasing  back pain. She tried meds and stretches but the pain continued. She went to MD who performed a CT scan and diagnosed her with a herniated disc. Pain increases with prolonged sitting or driving, slouching and sleeping on her Lt side. Pain decreases with lidocaine patches and heating pad  PAIN:  Are you having pain? Yes: NPRS scale: 4/10 currently, 7/10 at worst Pain location: left low back Pain description: sore, ache Aggravating factors: slouch, prolonged sitting Relieving factors: heating pad, lidocaine patches  PRECAUTIONS: None  RED FLAGS: None   WEIGHT BEARING RESTRICTIONS: No  FALLS:  Has patient fallen in last 6 months? 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  DIAGNOSTIC FINDINGS:  CT 05/2023: 1. No acute fracture or traumatic listhesis of the lumbar spine. 2. Disc bulge and left-greater-than-right facet arthropathy at L5-S1 results in moderate left neural foraminal narrowing. 3. Moderate degenerative changes of the right-greater-than-left sacroiliac joints.  PATIENT SURVEYS:  FOTO 54    POSTURE: anterior pelvic tilt  PALPATION: TTP Lt sacral border, glutes Hypomobile UPAs Lt SIJ  LUMBAR ROM:   AROM eval  Flexion WFL  Extension 50% pain   Right lateral flexion  75% pain  Left lateral flexion 75%  Right rotation WFL  Left rotation WFL pain   (Blank rows = not tested)  LOWER EXTREMITY ROM:     Passive  Right eval Left eval  Hip flexion [facility]  Hip extension    Hip abduction    Hip adduction    Hip internal rotation [facility]  Hip external rotation [facility]  Knee flexion    Knee extension    Ankle dorsiflexion    Ankle plantarflexion    Ankle inversion    Ankle eversion     (Blank rows = not tested)  LOWER EXTREMITY MMT:    MMT Right eval Left eval Right/left 08/22/23  Hip flexion 4 4 pain 4+/4+  Hip extension 4- 3 4/4-  Hip abduction 4 3 4/4  Hip adduction     Hip internal rotation     Hip external rotation     Knee flexion     Knee extension     Ankle dorsiflexion     Ankle plantarflexion     Ankle inversion     Ankle eversion      (Blank rows = not tested)  LUMBAR SPECIAL TESTS:  Straight leg raise test: Negative and FABER test: Negative  [facility] Adult PT Treatment:                                                DATE: 08/22/23 Therapeutic Exercise/Activity: Treadmill 1. grade 2 x 5 min for warm up Bird dog x 10 bilat Childs pose and childs pose laterally Seated pelvic tilt Plank on elevated table 3 x 20 seconds Row 10# x 15 Shoulder ext 10# x 15 Pallof 5# x 12 bilat Reivew of HEP and recommendations for d/c FOTO: 73   [facility] Adult PT Treatment:  DATE: 08/15/23 Therapeutic Exercise: Treadmill 1. grade 2 x 5 min for warm up Seated on physioball: pelvic tilt A/P, lateral, circles CW/CCW Childs pose front and laterally all 2 x 20 sec SKTC 2 x 30 sec Supine Piriformis stretch 2 x 30 sec Seated side bend stretch Slow lumbar flexion standing x 10 Sidelying open book x 10 (Lt only) STS with hip add ball squeeze x 15 Plank on elevated table 3 x 20 sec Side step red TB around feet  Therapeutic Activity: Education on posture and activity modification   [facility] Adult  PT Treatment:                                                DATE: 08/08/23 Therapeutic Exercise: Treadmill 1. grade 2 x 5 min for warm up Quadruped: hip ext x 10 bilat, bird dog x 10 bilat - cues for neutral spine Seated on physioball: pelvic tilt A/P, lateral, circles CW/CCW Childs pose front and laterally all x 20 sec Pallof press 5# x 10 bilat Supine piriformis stretch 2 x 20 sec Lt LTR x 10 Seated pelvic tilt  Therapeutic Activity: FOTO: 63 Education on progress towards goals and continuing POC   [facility] Adult PT Treatment:                                                DATE: 08/01/23 Therapeutic Exercise: Treadmill 1. grade 2 x 5 min for warm up Seated on physioball: pelvic tilt A/P, lateral, circles CW/CCW Quadruped: hip ext x 10 bilat, bird dog x 10 bilat - cues for neutral spine Slow march with 4# DB hold overhead 2 x 10 Sit to stand 10# KB 2 x 10 Plank on elevated table 3 x 20 sec Suitcase carry 10# x 2 laps Pallof press 5# x 10 bilat Bilat shoulder extension with bar at matrix machine 10# x 10   PATIENT EDUCATION:  Education details: PT POC and goals, HEP Person educated: Patient Education method: Programmer, multimedia, Demonstration, and Handouts Education comprehension: verbalized understanding and returned demonstration  HOME EXERCISE PROGRAM: Access Code: [facility] URL: [email or web address] Date: 08/22/2023 Prepared by: Reggy Eye  Exercises - Seated Pelvic Tilt  - 1 x daily - 7 x weekly - 3 sets - 10 reps - Plank with Hands on Table  - 1 x daily - 7 x weekly - 3 sets - 1 reps - 20-30 seconds hold - Bird Dog  - 1 x daily - 7 x weekly - 3 sets - 10 reps - Child's Pose with Sidebending  - 1 x daily - 7 x weekly - 1 sets - 10 reps - 10 seconds hold - Seated Row Cable Machine  - 1 x daily - 7 x weekly - 3 sets - 10 reps - Shoulder Extension with TRX  - 1 x daily - 7 x weekly - 3 sets - 10 reps - Lat Pull Down  - 1 x daily - 7 x weekly - 3 sets -  10 reps - Standing Anti-Rotation Press with Anchored Resistance  - 1 x daily - 7 x weekly - 3 sets - 10 reps  ASSESSMENT:  CLINICAL IMPRESSION: Pt has improved core strength and has decreased pain. She is independent with  HEP and is able to manage her symptoms on her own. She is ready for d/c    GOALS: Goals reviewed with patient? Yes  SHORT TERM GOALS: Target date: 08/10/2023  Pt will be independent in initial HEP Baseline: Goal status: MET  2.  Pt will improve FOTO to >= 60 to demo improving functional mobility Baseline:  Goal status: MET   LONG TERM GOALS: Target date: 09/07/2023    Pt will be independent in advanced HEP Baseline:  Goal status: MET  2.  Pt will improve FOTO to >= 67 to demo improved functional mobility Baseline:  Goal status: MET  3.  Pt will improve bilat LE strength to 4+/5  Baseline:  Goal status: NOT MET  4.  Pt will report pain <= 4/10 at worst Baseline:  Goal status: NOT MET  5.  Pt will tolerate sitting x 1 hour at work with pain <= 2/10 Baseline:  Goal status: MET   PLAN:  PT FREQUENCY: 1-2x/week  PT DURATION: 8 weeks  PLANNED INTERVENTIONS: 97164- PT Re-evaluation, 97110-Therapeutic exercises, 97530- Therapeutic activity, [id number]- Neuromuscular re-education, 97535- Self Care, [id number]- Manual therapy, [id number]- Aquatic Therapy, 97014- Electrical stimulation (unattended), [id number]- Ultrasound, 78469- Traction (mechanical), Patient/Family education, Taping, Dry Needling, Cryotherapy, and Moist heat.  Next Visit: d/c  PHYSICAL THERAPY DISCHARGE SUMMARY  Visits from Start of Care: 7  Current functional level related to goals / functional outcomes: Decreased pain, improved strength   Remaining deficits: See above   Education / Equipment: HEP   Patient agrees to discharge. Patient goals were partially met. Patient is being discharged due to being pleased with the current functional level.  Dillon Mcreynolds, PT 08/22/2023, 10:17 AM

## 2023-08-29 ENCOUNTER — Encounter: Payer: Self-pay | Admitting: Family Medicine

## 2023-09-24 ENCOUNTER — Ambulatory Visit: Admitting: Medical-Surgical

## 2023-09-24 ENCOUNTER — Encounter: Payer: Self-pay | Admitting: Medical-Surgical

## 2023-09-24 VITALS — BP 110/69 | HR 72 | Resp 20 | Ht 59.0 in | Wt 115.3 lb

## 2023-09-24 DIAGNOSIS — M5136 Other intervertebral disc degeneration, lumbar region with discogenic back pain only: Secondary | ICD-10-CM

## 2023-09-24 DIAGNOSIS — R0781 Pleurodynia: Secondary | ICD-10-CM

## 2023-09-24 LAB — POCT URINALYSIS DIP (CLINITEK)
Bilirubin, UA: NEGATIVE
Blood, UA: NEGATIVE
Glucose, UA: NEGATIVE mg/dL
Ketones, POC UA: NEGATIVE mg/dL
Leukocytes, UA: NEGATIVE
Nitrite, UA: NEGATIVE
POC PROTEIN,UA: NEGATIVE
Spec Grav, UA: 1.02 (ref 1.010–1.025)
Urobilinogen, UA: 1 U/dL
pH, UA: 7 (ref 5.0–8.0)

## 2023-09-24 MED ORDER — PANTOPRAZOLE SODIUM 40 MG PO TBEC: 40.0000 mg | DELAYED_RELEASE_TABLET | Freq: Every day | ORAL | 3 refills | Status: DC

## 2023-09-24 MED ORDER — METHOCARBAMOL 500 MG PO TABS: 500.0000 mg | ORAL_TABLET | Freq: Three times a day (TID) | ORAL | 0 refills | Status: DC

## 2023-09-24 MED ORDER — LUBIPROSTONE 24 MCG PO CAPS: 24.0000 ug | ORAL_CAPSULE | Freq: Two times a day (BID) | ORAL | 1 refills | Status: DC

## 2023-09-24 MED ORDER — METHYLPREDNISOLONE ACETATE 80 MG/ML IJ SUSP
80.0000 mg | Freq: Once | INTRAMUSCULAR | Status: AC
  Administered 2023-09-24: 80 mg via INTRAMUSCULAR

## 2023-09-24 NOTE — Progress Notes (Unsigned)
 Subjective:  Patient ID: Tina [name], female    DOB: May 30, 1986, 38 y.o.   MRN: [id number]  Patient Care Team: Everrett Coombe, DO as PCP - General (Family Medicine)   Chief Complaint:  Abdominal Pain and Chest Pain (BILATERAL RIB PAIN)   HPI:  Tina [name] is a 38 y.o. female presenting on 09/24/2023 for Abdominal Pain and Chest Pain (BILATERAL RIB PAIN)   History, Exam,  Impression and Plan HPI  1. Costal margin pain (Primary) Presents for upper abdominal costal margin pain ongoing 10 days after 6-hour road trip to [facility] after funeral.  History of chronic constipation with 4 to 5 days between BMs. Describes pain as dull crampy 5/10 at its worst. History of IBS but has not taken medications for IBS patient has historically tried MiraLAX for chronic constipation without consistent relief. History of laparoscopic surg in 2011, colposcopy,  C-section in 2022,  and right breast breast biopsy in 2007.  POCT urinalysis unremarkable, LMP 21 days ago and expects next cycle to start in 6 days.  Denies mittelschmerz. Oon exam negative costal rib pain with palpation and compression, negative rebound tenderness in all quadrants.  Increased discomfort/guarding with palpation of the right & left upper quadrants below costal margins.  LBM  this morning and was formed, hard, and  dark brown.  Admits that pain improved after BM.  Denies black stools & denies blood in stool.  Previous bowel movement was 5 days ago.  Strong suspicion of abdominal pain secondary to chronic constipation and IBS.  Patient is not receptive to MiraLAX or Ducosate sodium.  Patient expressed interest in lubiprostone.  Will start lubiprostone pending insurance approval.  -    Start lubiprostone (AMITIZA) 24 MCG capsule; Take 1 capsule (24 mcg total) by mouth 2 (two) times daily with a meal.  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  However patient recently traveled to [facility] via POV for a funeral and the 6-hour road trip worsened lower back pain.  Not taking previously prescribed meloxicam 7.5 mg daily, Skelaxin 800 mg p.o., or Flexeril 5 mg p.o.  Receptive to restarting meloxicam but was not receptive to restarting Skelaxin or Flexeril because of making her feel drowsy and lethargic.  Patient  was receptive to starting methocarbamol because she has not taken it before. -     Start methocarbamol (ROBAXIN) 500 MG tablet; Take 1 tablet (500 mg total) by mouth 3 (three) times daily. -     Start pantoprazole (PROTONIX) 40 MG tablet; Take 1 tablet (40 mg total) by mouth daily   Continue all other maintenance medications.  Follow up plan: Return in about 4 weeks (around 10/22/2023) for abdominal and back pain with PCP.  Tomorrow   Relevant past medical, surgical, family, and social history reviewed and updated as indicated.  Allergies and medications reviewed and updated. Data reviewed: Chart in Epic.   Past Medical History:  Diagnosis Date   Allergy    Asthma    Heart murmur     Past Surgical History:  Procedure Laterality Date   BREAST EXCISIONAL BIOPSY Left 2007   CESAREAN SECTION N/A 10/20/2020   Procedure: CESAREAN SECTION;  Surgeon: Lavina Hamman, MD;  Location: [facility] LD ORS;  Service: Obstetrics;  Laterality: N/A;  FHR Decelerations   COLPOSCOPY     LAPAROSCOPY     WISDOM TOOTH EXTRACTION  Social History   Socioeconomic History   Marital status: Single    Spouse name: Not on file   Number of children: Not on file   Years of education: Not on file   Highest education level: Not on file  Occupational History   Not on file  Tobacco Use   Smoking status: Former    Types: E-cigarettes   Smokeless tobacco: Never  Vaping Use   Vaping status: Never Used  Substance and Sexual Activity   Alcohol use: Not Currently    Comment: occ   Drug use: No    Sexual activity: Yes    Birth control/protection: Condom  Other Topics Concern   Not on file  Social History Narrative   Not on file   Social Drivers of Health   Financial Resource Strain: Low Risk  (02/04/2019)   Overall Financial Resource Strain (CARDIA)    Difficulty of Paying Living Expenses: Not hard at all  Food Insecurity: No Food Insecurity (02/04/2019)   Hunger Vital Sign    Worried About Running Out of Food in the Last Year: Never true    Ran Out of Food in the Last Year: Never true  Transportation Needs: No Transportation Needs (02/04/2019)   PRAPARE - Administrator, Civil Service (Medical): No    Lack of Transportation (Non-Medical): No  Physical Activity: Inactive (08/23/2017)   Exercise Vital Sign    Days of Exercise per Week: 0 days    Minutes of Exercise per Session: 0 min  Stress: No Stress Concern Present (08/23/2017)   [employer] of Occupational Health - Occupational Stress Questionnaire    Feeling of Stress : Not at all  Social Connections: Somewhat Isolated (08/23/2017)   Social Connection and Isolation Panel [NHANES]    Frequency of Communication with Friends and Family: More than three times a week    Frequency of Social Gatherings with Friends and Family: Once a week    Attends Religious Services: Never    Database administrator or Organizations: Yes    Attends Banker Meetings: 1 to 4 times per year    Marital Status: Never married  Intimate Partner Violence: Not At Risk (08/23/2017)   Humiliation, Afraid, Rape, and Kick questionnaire    Fear of Current or Ex-Partner: No    Emotionally Abused: No    Physically Abused: No    Sexually Abused: No    Outpatient Encounter Medications as of 09/24/2023  Medication Sig   albuterol (VENTOLIN HFA) 108 (90 Base) MCG/ACT inhaler 2 puffs every 4-6 hours as needed for coughing or wheezing spells   budesonide (PULMICORT FLEXHALER) 180 MCG/ACT inhaler Inhale 2 puffs twice a day to prevent  coughing or wheezing.Rinse mouth out after to help prevent thrush.   budesonide (RHINOCORT AQUA) 32 MCG/ACT nasal spray Use 2 sprays in each nostril once a day as needed for stuffy nose.   cyclobenzaprine (FLEXERIL) 5 MG tablet Take 1 tablet (5 mg total) by mouth 3 (three) times daily as needed for muscle spasms.   EPINEPHrine (EPIPEN 2-PAK) 0.3 mg/0.3 mL IJ SOAJ injection Use as directed for severe allergic reactions   lidocaine (XYLOCAINE) 2 % solution Use as directed 15 mLs in the mouth or throat as needed for mouth pain.   lubiprostone (AMITIZA) 24 MCG capsule Take 1 capsule (24 mcg total) by mouth 2 (two) times daily with a meal.   meloxicam (MOBIC) 7.5 MG tablet Take 1 tab every 12 hours as needed.  metaxalone (SKELAXIN) 800 MG tablet Take 1 tablet (800 mg total) by mouth 3 (three) times daily as needed for muscle spasms.   methocarbamol (ROBAXIN) 500 MG tablet Take 1 tablet (500 mg total) by mouth 3 (three) times daily.   pantoprazole (PROTONIX) 40 MG tablet Take 1 tablet (40 mg total) by mouth daily.   Spacer/Aero-Holding Chambers DEVI 01 spacer   No facility-administered encounter medications on file as of 09/24/2023.    Allergies  Allergen Reactions   Banana Extract Allergy Skin Test Anaphylaxis   Banana Swelling    Throat swelling   Cefazolin Swelling    Facial edema (no airway edema) during c-section after ancef. Has not been formally tested for cephalosporin allergy.    Diphenhydramine Hives   Latex Hives and Swelling    Review of Systems      Objective:  BP 110/69 (BP Location: Right Arm, Cuff Size: Small)   Pulse 72   Resp 20   Ht 4\' 11"  (1.499 m)   Wt 115 lb 4.8 oz (52.3 kg)   SpO2 96%   BMI 23.29 kg/m    Wt Readings from Last 3 Encounters:  09/24/23 115 lb 4.8 oz (52.3 kg)  07/05/23 109 lb (49.4 kg)  06/29/23 113 lb 9.6 oz (51.5 kg)    Physical Exam  Results for orders placed or performed during the hospital encounter of 07/05/23  Group A Strep by PCR    Collection Time: 07/05/23  6:12 PM   Specimen: Throat; Sterile Swab  Result Value Ref Range   Group A Strep by PCR NOT DETECTED NOT DETECTED       Pertinent labs & imaging results that were available during my care of the patient were reviewed by me and considered in my medical decision making.   Continue healthy lifestyle choices, including diet (rich in fruits, vegetables, and lean proteins, and low in salt and simple carbohydrates) and exercise (at least 30 minutes of moderate physical activity daily).   The above assessment and management plan was discussed with the patient. The patient verbalized understanding of and has agreed to the management plan. Patient is aware to call the clinic if they develop any new symptoms or if symptoms persist or worsen. Patient is aware when to return to the clinic for a follow-up visit. Patient educated on when it is appropriate to go to the emergency department.   Maryelizabeth Kaufmann Student AGNP

## 2023-09-25 ENCOUNTER — Encounter: Payer: Self-pay | Admitting: Medical-Surgical

## 2023-09-25 NOTE — Progress Notes (Signed)
 Medical screening examination/treatment was performed by qualified clinical staff member and as supervising provider I was immediately available for consultation/collaboration. I have reviewed documentation and agree with assessment and plan.  Thayer Ohm, DNP, APRN, FNP-BC [facility]

## 2023-10-01 ENCOUNTER — Telehealth: Payer: Self-pay

## 2023-10-01 NOTE — Telephone Encounter (Unsigned)
 Copied from CRM [id number]. Topic: Clinical - Lab/Test Results >> Oct 01, 2023 11:56 AM Nila Nephew wrote: Reason for CRM: Patient is calling in to request an immediate interpretation for lab results on 03/17. Patient would like a call for someone to explain these results ASAP. She states she never gets an answer and she always has to ask for an interpretation for labs regardless of who she sees and she feels this is unacceptable.

## 2023-10-14 ENCOUNTER — Encounter (HOSPITAL_BASED_OUTPATIENT_CLINIC_OR_DEPARTMENT_OTHER): Payer: Self-pay

## 2023-10-14 ENCOUNTER — Emergency Department (HOSPITAL_BASED_OUTPATIENT_CLINIC_OR_DEPARTMENT_OTHER)
Admission: EM | Admit: 2023-10-14 | Discharge: 2023-10-14 | Disposition: A | Discharge status: Home / Self Care | Attending: Emergency Medicine | Admitting: Emergency Medicine

## 2023-10-14 DIAGNOSIS — Z91040 Latex allergy status: Secondary | ICD-10-CM | POA: Diagnosis not present

## 2023-10-14 DIAGNOSIS — N939 Abnormal uterine and vaginal bleeding, unspecified: Secondary | ICD-10-CM | POA: Diagnosis present

## 2023-10-14 LAB — CBC
HCT: 34.1 % — ABNORMAL LOW (ref 36.0–46.0)
Hemoglobin: 11.3 g/dL — ABNORMAL LOW (ref 12.0–15.0)
MCH: 30.6 pg (ref 26.0–34.0)
MCHC: 33.1 g/dL (ref 30.0–36.0)
MCV: 92.4 fL (ref 80.0–100.0)
Platelets: 284 10*3/uL (ref 150–400)
RBC: 3.69 MIL/uL — ABNORMAL LOW (ref 3.87–5.11)
RDW: 13 % (ref 11.5–15.5)
WBC: 6.8 10*3/uL (ref 4.0–10.5)
nRBC: 0 % (ref 0.0–0.2)

## 2023-10-14 LAB — PREGNANCY, URINE: Preg Test, Ur: NEGATIVE

## 2023-10-14 NOTE — Discharge Instructions (Signed)
 Make an appointment to follow-up with your OB/GYN.  Return to the emergency room if you have any heavier bleeding, shortness of breath, dizziness, increased fatigue or other worsening symptoms.

## 2023-10-14 NOTE — ED Triage Notes (Signed)
 Pt reports vaginal bleeding with clots that started one hour ago. Not due for period to start

## 2023-10-14 NOTE — ED Notes (Signed)
 Reviewed discharge instructions and follow up with pt. Pt states understanding

## 2023-10-14 NOTE — ED Provider Notes (Signed)
 [place] EMERGENCY DEPARTMENT AT [facility] Provider Note   CSN: [id number] Arrival date & time: 10/14/23  1239     History  Chief Complaint  Patient presents with   Vaginal Bleeding    Tina [name] is a 38 y.o. female.  Patient is a 38 year old female who presents with vaginal bleeding.  She states her menstrual cycle was on March 22.  She started bleeding again today.  She said she had heavier bleeding than typical.  She said her March period was normal and was at the expected time.  She has not had any missed periods.  She has little bit of abdominal cramping.  No fevers.  No vomiting.  No dizziness or shortness of breath.  No history of abnormal bleeding in the past.  She does have an OB/GYN.       Home Medications Prior to Admission medications   Medication Sig Start Date End Date Taking? Authorizing Provider  albuterol (VENTOLIN HFA) 108 (90 Base) MCG/ACT inhaler 2 puffs every 4-6 hours as needed for coughing or wheezing spells 05/09/22   Nehemiah Settle, FNP  budesonide (PULMICORT FLEXHALER) 180 MCG/ACT inhaler Inhale 2 puffs twice a day to prevent coughing or wheezing.Rinse mouth out after to help prevent thrush. 06/29/23   Nehemiah Settle, FNP  budesonide (RHINOCORT AQUA) 32 MCG/ACT nasal spray Use 2 sprays in each nostril once a day as needed for stuffy nose. 11/23/22   Nehemiah Settle, FNP  cyclobenzaprine (FLEXERIL) 5 MG tablet Take 1 tablet (5 mg total) by mouth 3 (three) times daily as needed for muscle spasms. 05/23/23   Tawkaliyar, Roya, DO  EPINEPHrine (EPIPEN 2-PAK) 0.3 mg/0.3 mL IJ SOAJ injection Use as directed for severe allergic reactions 11/23/22   Nehemiah Settle, FNP  lidocaine (XYLOCAINE) 2 % solution Use as directed 15 mLs in the mouth or throat as needed for mouth pain. 07/05/23   Arabella Merles, PA-C  lubiprostone (AMITIZA) 24 MCG capsule Take 1 capsule (24 mcg total) by mouth 2 (two) times daily with a meal. 09/24/23   Christen Butter, NP   meloxicam (MOBIC) 7.5 MG tablet Take 1 tab every 12 hours as needed. 06/20/23   Everrett Coombe, DO  metaxalone (SKELAXIN) 800 MG tablet Take 1 tablet (800 mg total) by mouth 3 (three) times daily as needed for muscle spasms. 06/20/23   Everrett Coombe, DO  methocarbamol (ROBAXIN) 500 MG tablet Take 1 tablet (500 mg total) by mouth 3 (three) times daily. 09/24/23   Christen Butter, NP  pantoprazole (PROTONIX) 40 MG tablet Take 1 tablet (40 mg total) by mouth daily. 09/24/23   Christen Butter, NP  Spacer/Aero-Holding Chambers DEVI 01 spacer 12/09/21   Nehemiah Settle, FNP      Allergies    Banana extract allergy skin test, Banana, Cefazolin, Diphenhydramine, and Latex    Review of Systems   Review of Systems  Constitutional:  Negative for fatigue and fever.  HENT: Negative.    Respiratory:  Negative for shortness of breath.   Cardiovascular:  Negative for chest pain.  Gastrointestinal:  Negative for abdominal pain, nausea and vomiting.  Genitourinary:  Positive for pelvic pain (Mild cramping) and vaginal bleeding. Negative for vaginal discharge.  Skin:  Negative for pallor.  Neurological:  Negative for light-headedness.    Physical Exam Updated Vital Signs BP (!) 132/91   Pulse 94   Temp 98.4 F (36.9 C) (Oral)   Resp 16   Wt 52.2 kg   LMP 09/29/2023   SpO2  100%   BMI 23.23 kg/m  Physical Exam Constitutional:      Appearance: She is well-developed.  HENT:     Head: Normocephalic and atraumatic.  Eyes:     Pupils: Pupils are equal, round, and reactive to light.  Cardiovascular:     Rate and Rhythm: Normal rate and regular rhythm.     Heart sounds: Normal heart sounds.  Pulmonary:     Effort: Pulmonary effort is normal. No respiratory distress.     Breath sounds: Normal breath sounds. No wheezing or rales.  Chest:     Chest wall: No tenderness.  Abdominal:     General: Bowel sounds are normal.     Palpations: Abdomen is soft.     Tenderness: There is no abdominal tenderness.  There is no guarding or rebound.  Genitourinary:    Comments: Small amount of dark blood in the vaginal vault.  No cervical motion tenderness or adnexal tenderness Musculoskeletal:        General: Normal range of motion.     Cervical back: Normal range of motion and neck supple.  Lymphadenopathy:     Cervical: No cervical adenopathy.  Skin:    General: Skin is warm and dry.     Findings: No rash.  Neurological:     Mental Status: She is alert and oriented to person, place, and time.     ED Results / Procedures / Treatments   Labs (all labs ordered are listed, but only abnormal results are displayed) Labs Reviewed  CBC - Abnormal; Notable for the following components:      Result Value   RBC 3.69 (*)    Hemoglobin 11.3 (*)    HCT 34.1 (*)    All other components within normal limits  PREGNANCY, URINE    EKG None  Radiology No results found.  Procedures Procedures    Medications Ordered in ED Medications - No data to display  ED Course/ Medical Decision Making/ A&P                                 Medical Decision Making Amount and/or Complexity of Data Reviewed Labs: ordered.   Patient is a 38 year old who presents with vaginal bleeding.  It started today.  Her last menstrual cycle was March 22.  She does not have any abdominal or pelvic tenderness on exam.  There is a small amount of dark blood in the vault.  Her pregnancy test is negative.  Given this, no concerns for ectopic pregnancy.  She does not have pain that would be more concerning for ovarian torsion or ovarian cyst.  Her hemoglobin is stable as compared to chart review.  She did not have any abnormal discharge on exam concerning for infection.  She was discharged home in good condition.  She was encouraged to follow-up with her OB/GYN.  Return precautions were given.  Final Clinical Impression(s) / ED Diagnoses Final diagnoses:  Abnormal vaginal bleeding    Rx / DC Orders ED Discharge Orders      None         Rolan Bucco, MD 10/14/23 1351

## 2023-10-19 ENCOUNTER — Other Ambulatory Visit: Payer: Self-pay | Admitting: Medical-Surgical

## 2023-12-03 ENCOUNTER — Other Ambulatory Visit: Payer: Self-pay

## 2023-12-03 ENCOUNTER — Emergency Department (HOSPITAL_BASED_OUTPATIENT_CLINIC_OR_DEPARTMENT_OTHER): Admission: EM | Admit: 2023-12-03 | Discharge: 2023-12-03 | Disposition: A | Discharge status: Home / Self Care

## 2023-12-03 DIAGNOSIS — J45909 Unspecified asthma, uncomplicated: Secondary | ICD-10-CM | POA: Insufficient documentation

## 2023-12-03 DIAGNOSIS — R55 Syncope and collapse: Secondary | ICD-10-CM | POA: Diagnosis not present

## 2023-12-03 DIAGNOSIS — Z91040 Latex allergy status: Secondary | ICD-10-CM | POA: Diagnosis not present

## 2023-12-03 DIAGNOSIS — Z7951 Long term (current) use of inhaled steroids: Secondary | ICD-10-CM | POA: Insufficient documentation

## 2023-12-03 DIAGNOSIS — N939 Abnormal uterine and vaginal bleeding, unspecified: Secondary | ICD-10-CM | POA: Diagnosis not present

## 2023-12-03 DIAGNOSIS — R42 Dizziness and giddiness: Secondary | ICD-10-CM | POA: Insufficient documentation

## 2023-12-03 LAB — BASIC METABOLIC PANEL WITH GFR
Anion gap: 11 (ref 5–15)
BUN: 8 mg/dL (ref 6–20)
CO2: 24 mmol/L (ref 22–32)
Calcium: 9.4 mg/dL (ref 8.9–10.3)
Chloride: 102 mmol/L (ref 98–111)
Creatinine, Ser: 0.72 mg/dL (ref 0.44–1.00)
GFR, Estimated: >60 mL/min (ref >60)
Glucose, Bld: 88 mg/dL (ref 70–99)
Potassium: 3.8 mmol/L (ref 3.5–5.1)
Sodium: 138 mmol/L (ref 135–145)

## 2023-12-03 LAB — CBC
HCT: 33.9 % — ABNORMAL LOW (ref 36.0–46.0)
Hemoglobin: 11.3 g/dL — ABNORMAL LOW (ref 12.0–15.0)
MCH: 31.1 pg (ref 26.0–34.0)
MCHC: 33.3 g/dL (ref 30.0–36.0)
MCV: 93.4 fL (ref 80.0–100.0)
Platelets: 288 10*3/uL (ref 150–400)
RBC: 3.63 MIL/uL — ABNORMAL LOW (ref 3.87–5.11)
RDW: 12.7 % (ref 11.5–15.5)
WBC: 7.2 10*3/uL (ref 4.0–10.5)
nRBC: 0 % (ref 0.0–0.2)

## 2023-12-03 LAB — URINALYSIS, ROUTINE W REFLEX MICROSCOPIC
Bacteria, UA: NONE SEEN
Bilirubin Urine: NEGATIVE
Glucose, UA: NEGATIVE mg/dL
Hgb urine dipstick: NEGATIVE
Ketones, ur: 15 mg/dL — AB
Leukocytes,Ua: NEGATIVE
Nitrite: NEGATIVE
Protein, ur: NEGATIVE mg/dL
Specific Gravity, Urine: 1.01 (ref 1.005–1.030)
pH: 7.5 (ref 5.0–8.0)

## 2023-12-03 LAB — CBG MONITORING, ED: Glucose-Capillary: 101 mg/dL — ABNORMAL HIGH (ref 70–99)

## 2023-12-03 LAB — HCG, SERUM, QUALITATIVE: Preg, Serum: NEGATIVE

## 2023-12-03 MED ORDER — LACTATED RINGERS IV BOLUS
1000.0000 mL | Freq: Once | INTRAVENOUS | Status: AC
Start: 2023-12-03 — End: 2023-12-03
  Administered 2023-12-03: 1000 mL via INTRAVENOUS

## 2023-12-03 NOTE — ED Provider Notes (Signed)
 [place] EMERGENCY DEPARTMENT AT [facility] Provider Note   CSN: [id number] Arrival date & time: 12/03/23  1815     History  Chief Complaint  Patient presents with   Near Syncope    Tina [name] is a 38 y.o. female.  38 year old female with past medical history of endometriosis and asthma presenting to the emergency department today with an episode of lightheadedness.  The patient states that she was seated with her child when she noted that she was lightheaded.  This lasted a few minutes.  She denies any associated chest pain.  She denies any nausea, vomiting, or diarrhea.  She states that her urine has been darker than normal recently.  She denies a history of DVT or pulmonary embolism, recent surgeries, recent travel.  Denies any leg pain or swelling.  She came to the ER today for further evaluation regarding this due to the symptoms.  She states this has happened in the past.  She states that she does have endometriosis and some abnormal uterine bleeding.  She has been seeing her doctor regarding this.  She states that she had bleeding last month for 17 days straight.  She denies any vaginal bleeding currently.   Near Syncope       Home Medications Prior to Admission medications   Medication Sig Start Date End Date Taking? Authorizing Provider  albuterol  (VENTOLIN  HFA) 108 (90 Base) MCG/ACT inhaler 2 puffs every 4-6 hours as needed for coughing or wheezing spells 05/09/22   Tinnie Forehand, FNP  budesonide  (PULMICORT  FLEXHALER) 180 MCG/ACT inhaler Inhale 2 puffs twice a day to prevent coughing or wheezing.Rinse mouth out after to help prevent thrush. 06/29/23   Tinnie Forehand, FNP  budesonide  (RHINOCORT  AQUA) 32 MCG/ACT nasal spray Use 2 sprays in each nostril once a day as needed for stuffy nose. 11/23/22   Tinnie Forehand, FNP  cyclobenzaprine  (FLEXERIL ) 5 MG tablet Take 1 tablet (5 mg total) by mouth 3 (three) times daily as needed for muscle spasms.  05/23/23   Tawkaliyar, Roya, DO  EPINEPHrine  (EPIPEN  2-PAK) 0.3 mg/0.3 mL IJ SOAJ injection Use as directed for severe allergic reactions 11/23/22   Tinnie Forehand, FNP  lidocaine  (XYLOCAINE ) 2 % solution Use as directed 15 mLs in the mouth or throat as needed for mouth pain. 07/05/23   Rexie Catena, PA-C  lubiprostone  (AMITIZA ) 24 MCG capsule TAKE 1 CAPSULE (24 MCG TOTAL) BY MOUTH 2 (TWO) TIMES DAILY WITH A MEAL. 10/22/23   Adela Holter, DO  meloxicam  (MOBIC ) 7.5 MG tablet Take 1 tab every 12 hours as needed. 06/20/23   Adela Holter, DO  metaxalone  (SKELAXIN ) 800 MG tablet Take 1 tablet (800 mg total) by mouth 3 (three) times daily as needed for muscle spasms. 06/20/23   Adela Holter, DO  methocarbamol  (ROBAXIN ) 500 MG tablet Take 1 tablet (500 mg total) by mouth 3 (three) times daily. 09/24/23   Cherre Cornish, NP  pantoprazole  (PROTONIX ) 40 MG tablet Take 1 tablet (40 mg total) by mouth daily. 09/24/23   Cherre Cornish, NP  Spacer/Aero-Holding Chambers DEVI 01 spacer 12/09/21   Tinnie Forehand, FNP      Allergies    Banana extract allergy  skin test, Banana, Cefazolin , Diphenhydramine , and Latex    Review of Systems   Review of Systems  Cardiovascular:  Positive for near-syncope.  Neurological:  Positive for light-headedness.  All other systems reviewed and are negative.   Physical Exam Updated Vital Signs BP 133/82   Pulse 74   Temp  98.2 F (36.8 C) (Oral)   Resp 18   Ht 4\' 11"  (1.499 m)   Wt 49.9 kg   LMP 11/21/2023 (Exact Date)   SpO2 100%   BMI 22.22 kg/m  Physical Exam Vitals and nursing note reviewed.   Gen: NAD Eyes: PERRL, EOMI HEENT: no oropharyngeal swelling Neck: trachea midline Resp: clear to auscultation bilaterally Card: RRR, no murmurs, rubs, or gallops Abd: nontender, nondistended Extremities: no calf tenderness, no edema Vascular: 2+ radial pulses bilaterally, 2+ DP pulses bilaterally Neuro: No focal deficits Skin: no rashes Psyc: acting  appropriately   ED Results / Procedures / Treatments   Labs (all labs ordered are listed, but only abnormal results are displayed) Labs Reviewed  CBC - Abnormal; Notable for the following components:      Result Value   RBC 3.63 (*)    Hemoglobin 11.3 (*)    HCT 33.9 (*)    All other components within normal limits  URINALYSIS, ROUTINE W REFLEX MICROSCOPIC - Abnormal; Notable for the following components:   Color, Urine COLORLESS (*)    Ketones, ur 15 (*)    All other components within normal limits  CBG MONITORING, ED - Abnormal; Notable for the following components:   Glucose-Capillary 101 (*)    All other components within normal limits  BASIC METABOLIC PANEL WITH GFR  HCG, SERUM, QUALITATIVE    EKG EKG Interpretation Date/Time:  Monday Dec 03 2023 18:33:39 EDT Ventricular Rate:  74 PR Interval:  124 QRS Duration:  78 QT Interval:  401 QTC Calculation: 445 R Axis:   69  Text Interpretation: Sinus rhythm Confirmed by Abner Hoffman [phone]) on 12/03/2023 6:38:30 PM  Radiology No results found.  Procedures Procedures    Medications Ordered in ED Medications  lactated ringers  bolus 1,000 mL (0 mLs Intravenous Stopped 12/03/23 2006)    ED Course/ Medical Decision Making/ A&P                                 Medical Decision Making 38 year old female with past medical history of asthma and endometriosis presenting to the emergency department today with lightheadedness.  The patient is well-appearing here with stable vital signs.  EKG does not show any concerns for ischemia and her intervals are within normal limits.  Will keep the patient on the cardiac monitor to evaluate for arrhythmias.  Will obtain basic labs to evaluate for anemia or electrolyte abnormalities.  Also obtain a urinalysis here.  I will give patient IV fluids.  Will reevaluate for ultimate disposition.  She is PERC negative and based on description of her symptoms suspicion for pulmonary embolism is low  at this time. . The patient's workup was reassuring.  She is feeling better on reassessment.  She is discharged with return precautions.   Amount and/or Complexity of Data Reviewed Labs: ordered.           Final Clinical Impression(s) / ED Diagnoses Final diagnoses:  Lightheadedness    Rx / DC Orders ED Discharge Orders     None         Carin Charleston, MD 12/03/23 2043

## 2023-12-03 NOTE — Discharge Instructions (Addendum)
 Your workup today was reassuring.  Please follow-up with your doctor and return to the ER for worsening symptoms.

## 2023-12-03 NOTE — ED Triage Notes (Addendum)
 Pt POV reporting near-syncopal episode while sitting watching toddler, feeling lasted about one hour. Multiple similar episodes in the past, previously attributed to anxiety. Denies SOB, NAD noted at this time.

## 2023-12-26 ENCOUNTER — Ambulatory Visit: Payer: Self-pay

## 2023-12-26 NOTE — Telephone Encounter (Signed)
 FYI Only or Action Required?: FYI only for provider  Patient was last seen in primary care on 09/24/2023 by Cherre Cornish, NP. Called Nurse Triage reporting No chief complaint on file.. Symptoms began several days ago. Interventions attempted: OTC medications: Expectorant and Prescription medications: RX Inhaler. Symptoms are: gradually worsening.  Triage Disposition: See Physician Within 24 Hours  Patient/caregiver understands and will follow disposition?: Yes   Copied from CRM [id number]. Topic: Clinical - Red Word Triage >> Dec 26, 2023  8:47 AM Retta Caster wrote: Red Word that prompted transfer to Nurse Triage: asthma flair up/Body sore when she coughs 06/15   Reason for Disposition  SEVERE coughing spells (e.g., whooping sound after coughing, vomiting after coughing)  Answer Assessment - Initial Assessment Questions 1. ONSET: When did the cough begin?      Several Days ago  2. SEVERITY: How bad is the cough today?      Constant  3. SPUTUM: Describe the color of your sputum (none, dry cough; clear, white, yellow, green)     Light White Yellow  4. HEMOPTYSIS: Are you coughing up any blood? If so ask: How much? (flecks, streaks, tablespoons, etc.)     No  5. DIFFICULTY BREATHING: Are you having difficulty breathing? If Yes, ask: How bad is it? (e.g., mild, moderate, severe)    - MILD: No SOB at rest, mild SOB with walking, speaks normally in sentences, can lie down, no retractions, pulse < 100.    - MODERATE: SOB at rest, SOB with minimal exertion and prefers to sit, cannot lie down flat, speaks in phrases, mild retractions, audible wheezing, pulse 100-120.    - SEVERE: Very SOB at rest, speaks in single words, struggling to breathe, sitting hunched forward, retractions, pulse > 120      None  6. FEVER: Do you have a fever? If Yes, ask: What is your temperature, how was it measured, and when did it start?     No  7. CARDIAC HISTORY: Do you have any history of heart  disease? (e.g., heart attack, congestive heart failure)      No  8. LUNG HISTORY: Do you have any history of lung disease?  (e.g., pulmonary embolus, asthma, emphysema)     Asthma  9. PE RISK FACTORS: Do you have a history of blood clots? (or: recent major surgery, recent prolonged travel, bedridden)     No  10. OTHER SYMPTOMS: Do you have any other symptoms? (e.g., runny nose, wheezing, chest pain)       Wheezing at night, Chest Tightness  11. PREGNANCY: Is there any chance you are pregnant? When was your last menstrual period?       No and LMP  12. TRAVEL: Have you traveled out of the country in the last month? (e.g., travel history, exposures)       Unsure  Protocols used: Cough - Acute Productive-A-AH

## 2023-12-27 ENCOUNTER — Encounter: Payer: Self-pay | Admitting: Family Medicine

## 2023-12-27 ENCOUNTER — Ambulatory Visit (INDEPENDENT_AMBULATORY_CARE_PROVIDER_SITE_OTHER): Admitting: Family Medicine

## 2023-12-27 VITALS — BP 129/80 | HR 94 | Ht 59.0 in | Wt 117.0 lb

## 2023-12-27 DIAGNOSIS — R52 Pain, unspecified: Secondary | ICD-10-CM | POA: Diagnosis not present

## 2023-12-27 DIAGNOSIS — J069 Acute upper respiratory infection, unspecified: Secondary | ICD-10-CM | POA: Insufficient documentation

## 2023-12-27 DIAGNOSIS — R051 Acute cough: Secondary | ICD-10-CM | POA: Diagnosis not present

## 2023-12-27 LAB — POC COVID19/FLU A&B COMBO
Covid Antigen, POC: NEGATIVE
Influenza A Antigen, POC: NEGATIVE
Influenza B Antigen, POC: NEGATIVE

## 2023-12-27 MED ORDER — PREDNISONE 20 MG PO TABS: 20.0000 mg | ORAL_TABLET | Freq: Two times a day (BID) | ORAL | 0 refills | Status: AC

## 2023-12-27 NOTE — Progress Notes (Signed)
 Tina [name] - 38 y.o. female MRN [id number]  Date of birth: 06/04/1986  Subjective Chief Complaint  Patient presents with   Cough   Chest Pain    HPI Tina [name] is 38 y.o. female here today with complaint of congestion, fatigue, body aches, and chest tightness.  Symptoms started about 2-3 days ago.  She denies fever or chills.  She has not had GI symptoms including nausea, vomiting, diarrhea.  She has tried robitussin with some relief.  She has noted some wheezing at night.  This is relieved by her albuterol .  ROS:  A comprehensive ROS was completed and negative except as noted per HPI  Allergies  Allergen Reactions   Banana Extract Allergy  Skin Test Anaphylaxis   Banana Swelling    Throat swelling   Cefazolin  Swelling    Facial edema (no airway edema) during c-section after ancef . Has not been formally tested for cephalosporin allergy .    Diphenhydramine  Hives   Latex Hives and Swelling    Past Medical History:  Diagnosis Date   Allergy     Asthma    Heart murmur     Past Surgical History:  Procedure Laterality Date   BREAST EXCISIONAL BIOPSY Left 2007   CESAREAN SECTION N/A 10/20/2020   Procedure: CESAREAN SECTION;  Surgeon: Cyd Dowse, MD;  Location: [facility] LD [facility]S;  Service: Obstetrics;  Laterality: N/A;  FHR Decelerations   COLPOSCOPY     LAPAROSCOPY     WISDOM TOOTH EXTRACTION      Social History   Socioeconomic History   Marital status: Single    Spouse name: Not on file   Number of children: Not on file   Years of education: Not on file   Highest education level: Not on file  Occupational History   Not on file  Tobacco Use   Smoking status: Former    Types: E-cigarettes   Smokeless tobacco: Never  Vaping Use   Vaping status: Never Used  Substance and Sexual Activity   Alcohol use: Not Currently    Comment: occ   Drug use: No   Sexual activity: Yes    Birth control/protection: Condom  Other Topics Concern   Not on file  Social  History Narrative   Not on file   Social Drivers of Health   Financial Resource Strain: Low Risk  (02/04/2019)   Overall Financial Resource Strain (CARDIA)    Difficulty of Paying Living Expenses: Not hard at all  Food Insecurity: No Food Insecurity (12/27/2023)   Hunger Vital Sign    Worried About Running Out of Food in the Last Year: Never true    Ran Out of Food in the Last Year: Never true  Transportation Needs: No Transportation Needs (12/27/2023)   PRAPARE - Administrator, Civil Service (Medical): No    Lack of Transportation (Non-Medical): No  Physical Activity: Inactive (08/23/2017)   Exercise Vital Sign    Days of Exercise per Week: 0 days    Minutes of Exercise per Session: 0 min  Stress: No Stress Concern Present (08/23/2017)   [employer] of Occupational Health - Occupational Stress Questionnaire    Feeling of Stress : Not at all  Social Connections: Moderately Isolated (12/27/2023)   Social Connection and Isolation Panel    Frequency of Communication with Friends and Family: Three times a week    Frequency of Social Gatherings with Friends and Family: Once a week    Attends Religious Services: Never  Active Member of Clubs or Organizations: Yes    Attends Banker Meetings: 1 to 4 times per year    Marital Status: Never married    Family History  Problem Relation Age of Onset   Diabetes Mother    Hypertension Father    Asthma Father    Diabetes Father    Cancer Maternal Aunt        colon   Lupus Maternal Aunt    Diabetes Maternal Aunt    Breast cancer Maternal Grandmother    Breast cancer Paternal Grandmother    Diabetes Paternal Grandmother    Hypertension Maternal Aunt    Diabetes Maternal Aunt    Diabetes Maternal Aunt    Allergic rhinitis Sister    Food Allergy  Sister    Eczema Sister    Asthma Maternal Uncle    Diabetes Paternal Grandfather     Health Maintenance  Topic Date Due   COVID-19 Vaccine (1) Never done    DTaP/Tdap/Td (1 - Tdap) Never done   Pneumococcal Vaccine 41-67 Years old (1 of 2 - PCV) Never done   HPV VACCINES (1 - Risk 3-dose SCDM series) Never done   Cervical Cancer Screening (HPV/Pap Cotest)  Never done   INFLUENZA VACCINE  02/08/2024   Hepatitis C Screening  Completed   HIV Screening  Completed   Meningococcal B Vaccine  Aged Out     ----------------------------------------------------------------------------------------------------------------------------------------------------------------------------------------------------------------- Physical Exam BP 129/80 (BP Location: Left Arm, Patient Position: Sitting, Cuff Size: Small)   Pulse 94   Ht 4' 11 (1.499 m)   Wt 117 lb (53.1 kg)   LMP 11/21/2023 (Exact Date)   SpO2 100%   BMI 23.63 kg/m   Physical Exam Constitutional:      Appearance: Normal appearance.   Eyes:     General: No scleral icterus.   Cardiovascular:     Rate and Rhythm: Normal rate and regular rhythm.  Pulmonary:     Effort: Pulmonary effort is normal.     Breath sounds: Normal breath sounds.   Neurological:     Mental Status: She is alert.   Psychiatric:        Mood and Affect: Mood normal.        Behavior: Behavior normal.     ------------------------------------------------------------------------------------------------------------------------------------------------------------------------------------------------------------------- Assessment and Plan  Viral URI POC COVID and Flu are negative.  Recommend continued supportive care.  Will add prednisone  burst with history of asthma and evening wheezing.  Red flags and reasons to return to clinic reviewed.    Meds ordered this encounter  Medications   predniSONE  (DELTASONE ) 20 MG tablet    Sig: Take 1 tablet (20 mg total) by mouth 2 (two) times daily with a meal for 5 days.    Dispense:  10 tablet    Refill:  0    No follow-ups on file.

## 2023-12-27 NOTE — Patient Instructions (Addendum)
 Mild persistent asthma-not well controlled Start prednisone  as prescribed by primary care physician Stop Pulmicort  Flexihaler 180 mcg -no longer breastfeeding Start fluticasone  110 mcg 2 puffs twice a day to help prevent cough and wheeze. This is safe to use while breastfeeding. May use albuterol  2 puffs every 4 hours as needed for cough, wheeze, tightness in chest, or shortness of breath.    Seasonal and perennial allergic rhinitis May use an over-the-counter any histamine such as Zyrtec (cetirizine), Claritin  (loratadine ), Xyzal  (levocetirizine), or Allegra (fexofenadine) once a day as needed for runny nose Start Flonase  (fluticasone ) 1-2 sprays each nostril once a day as needed for stuffy.  In the right nostril, point the applicator out toward the right ear. In the left nostril, point the applicator out toward the left ear Stop Rhinocort   Gastroesophageal reflux disease Continue dietary and lifestyle modifications Continue Protonix  as per your primary care physician.   Food allergy  - Continue to avoid banana. -Continue to carry epinephrine  auto-injector with you at all times.   -If  interested she can schedule an in office oral food challenge to banana. If  not interested in scheduling an in office oral food challenge to banana you need to continue to avoid bananas and have access to her epinephrine  autoinjector device. If interested you will need to bring a banana with you the day of the appointment. This appointment will last approximately 2 to 3 hours. You will need to be off all antihistamines 3 days prior to this appointment and be in good health. (No recent antibiotics or vaccines.)    Please let us  know if this treatment plan is not working well for you Schedule a follow-up appointment in 3-4 months or sooner if needed

## 2023-12-27 NOTE — Assessment & Plan Note (Signed)
 POC COVID and Flu are negative.  Recommend continued supportive care.  Will add prednisone  burst with history of asthma and evening wheezing.  Red flags and reasons to return to clinic reviewed.

## 2023-12-27 NOTE — Patient Instructions (Signed)

## 2023-12-28 ENCOUNTER — Encounter: Payer: Self-pay | Admitting: Family

## 2023-12-28 ENCOUNTER — Telehealth: Payer: Self-pay

## 2023-12-28 ENCOUNTER — Ambulatory Visit (INDEPENDENT_AMBULATORY_CARE_PROVIDER_SITE_OTHER): Payer: Medicaid Other | Admitting: Family

## 2023-12-28 VITALS — BP 110/76 | HR 91 | Temp 96.7°F | Resp 18 | Ht 60.0 in | Wt 117.4 lb

## 2023-12-28 DIAGNOSIS — J3089 Other allergic rhinitis: Secondary | ICD-10-CM | POA: Diagnosis not present

## 2023-12-28 DIAGNOSIS — T7800XD Anaphylactic reaction due to unspecified food, subsequent encounter: Secondary | ICD-10-CM

## 2023-12-28 DIAGNOSIS — K219 Gastro-esophageal reflux disease without esophagitis: Secondary | ICD-10-CM | POA: Diagnosis not present

## 2023-12-28 DIAGNOSIS — J4530 Mild persistent asthma, uncomplicated: Secondary | ICD-10-CM | POA: Diagnosis not present

## 2023-12-28 MED ORDER — FLUTICASONE PROPIONATE HFA 110 MCG/ACT IN AERO: INHALATION_SPRAY | RESPIRATORY_TRACT | 5 refills | Status: AC

## 2023-12-28 MED ORDER — FLUTICASONE PROPIONATE 50 MCG/ACT NA SUSP: NASAL | 5 refills | Status: AC

## 2023-12-28 MED ORDER — ALBUTEROL SULFATE HFA 108 (90 BASE) MCG/ACT IN AERS: INHALATION_SPRAY | RESPIRATORY_TRACT | 1 refills | Status: AC

## 2023-12-28 MED ORDER — EPINEPHRINE 0.3 MG/0.3ML IJ SOAJ: INTRAMUSCULAR | 1 refills | Status: AC

## 2023-12-28 NOTE — Telephone Encounter (Signed)
 Copied from CRM [id number]. Topic: General - Other >> Dec 28, 2023  2:15 PM Shamecia H wrote: Reason for CRM: Patient is needing a doctor excuse from yesterday, patient would like it emailed, email:[email or web address], patient would also like the note to include what the patient was actually seen for because she is working remote and her boss is wanting to know why and the dates 06/17-until 06/20.

## 2023-12-28 NOTE — Progress Notes (Signed)
 [address] [place] [address] Dept: [phone]  FOLLOW UP NOTE  Patient ID: Tina [name], female    DOB: [birth year]  Age: 38 y.o. MRN: [id number] Date of Office Visit: 12/28/2023  Assessment  Chief Complaint: Follow-up, Cough, and Sinus Problem (Saw pcp covid and flu negative. Rx prednisone  at pharmacy. 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  She was last seen by myself on June 29, 2023.  She reports that yesterday she saw her primary care physician and was diagnosed with a viral upper respiratory infection.  She is currently wearing a heart monitor for 14 days.  Mild persistent asthma/seasonal and perennial allergic rhinitis: She reports on Sunday she developed a sore throat, runny nose, drainage down the throat, and stuffy nose.  She then began coughing.  The cough can be productive and nonproductive.  What she is coughing up is clear in color.  The cough is keeping her up at night.  She is only had about 6 hours of sleep over the past 4 days.  On Tuesday she had wheezing.  She has tightness in her chest only when she is coughing.  She reports that she made herself sick yesterday from coughing so hard.  She does have body aches and does not have an appetite.  She denies shortness of breath, fever and chills.  The sore throat did get better and now she feels like it is now sore from coughing.  Yesterday she went to her primary care physician and reports that her COVID-19 and influenza test were negative.  She was prescribed prednisone , but has not picked that up from the pharmacy yet.  She is not currently taking Pulmicort  Flexhaler 180 mcg 2 puffs twice a day as recommended.  She is not sure where this inhaler is.  She  is no longer breast-feeding.  Her albuterol  is expired, but she  was still using it and is not sure if it was working.  She is not using any nasal sprays, but is open to trying Flonase  nasal spray.  She is not taking any antihistamines.  She has been taking Robitussin cough and congestion and Robitussin multisymptom.  She reports that these medications give her temporary relief.  Gastroesophageal reflux disease: She is currently taking Protonix  as needed and she wonders if it was acid reflux that caused her to throw up.  Food allergy : She continues to avoid banana without any accidental ingestion or use of her epinephrine  autoinjector device.     Drug Allergies:  Allergies  Allergen Reactions   Banana Extract Allergy  Skin Test Anaphylaxis   Banana Swelling    Throat swelling   Cefazolin  Swelling    Facial edema (no airway edema) during c-section after ancef . Has not been formally tested for cephalosporin allergy .    Diphenhydramine  Hives   Latex Hives and Swelling    Review of Systems: Negative except as per HPI  Physical Exam: BP 110/76   Pulse 91   Temp (!) 96.7 F (35.9 C) (Temporal)   Resp 18   Ht 5' (1.524 m)   Wt 117 lb 6.4 oz (53.3 kg)   LMP 11/21/2023 (Exact Date)   SpO2 100%   BMI 22.93 kg/m    Physical Exam Constitutional:      Appearance: Normal appearance.  HENT:     Head:  Normocephalic and atraumatic.     Comments: Pharynx: Drainage noted, eyes normal, ears normal, nose: Bilateral lower turbinates mildly edematous and erythematous with no drainage noted    Right Ear: Tympanic membrane, ear canal and external ear normal.     Left Ear: Tympanic membrane, ear canal and external ear normal.     Mouth/Throat:     Mouth: Mucous membranes are moist.     Pharynx: Oropharynx is clear.   Eyes:     Conjunctiva/sclera: Conjunctivae normal.    Cardiovascular:     Rate and Rhythm: Regular rhythm.     Heart sounds: Normal heart sounds.  Pulmonary:     Effort: Pulmonary effort is normal.     Breath sounds: Normal breath  sounds.     Comments: Lungs clear to auscultation  Musculoskeletal:     Cervical back: Neck supple.   Skin:    General: Skin is warm.   Neurological:     Mental Status: She is alert and oriented to person, place, and time.   Psychiatric:        Mood and Affect: Mood normal.        Behavior: Behavior normal.        Thought Content: Thought content normal.        Judgment: Judgment normal.     Diagnostics: FVC 2.35 L (85%), FEV1 1.89 L (81%), FEV1/FVC 0.80.  Predicted FVC 2.76 L, predicted FEV1 2.32 L.  Spirometry indicates normal respiratory function.  Assessment and Plan: 1. Seasonal and perennial allergic rhinitis   2. Not well controlled mild persistent asthma   3. Anaphylactic shock due to food, subsequent encounter   4. Gastroesophageal reflux disease, unspecified whether esophagitis present     Meds ordered this encounter  Medications   albuterol  (VENTOLIN  HFA) 108 (90 Base) MCG/ACT inhaler    Sig: 2 puffs every 4-6 hours as needed for coughing or wheezing spells    Dispense:  18 g    Refill:  1   EPINEPHrine  (EPIPEN  2-PAK) 0.3 mg/0.3 mL IJ SOAJ injection    Sig: Use as directed for severe allergic reactions    Dispense:  2 each    Refill:  1    Dispense [employer] generic only   fluticasone  (FLOVENT  HFA) 110 MCG/ACT inhaler    Sig: Inhale 2 puffs twice a day with spacer to help prevent cough and wheeze.  Rinse mouth out afterwards    Dispense:  1 each    Refill:  5   fluticasone  (FLONASE ) 50 MCG/ACT nasal spray    Sig: Place 1 to 2 sprays in each nostril once a day as needed for stuffy nose    Dispense:  16 g    Refill:  5    Patient Instructions  Mild persistent asthma-not well controlled Start prednisone  as prescribed by primary care physician Stop Pulmicort  Flexihaler 180 mcg -no longer breastfeeding Start fluticasone  110 mcg 2 puffs twice a day to help prevent cough and wheeze. This is safe to use while breastfeeding. May use albuterol  2 puffs every 4  hours as needed for cough, wheeze, tightness in chest, or shortness of breath.    Seasonal and perennial allergic rhinitis May use an over-the-counter any histamine such as Zyrtec (cetirizine), Claritin  (loratadine ), Xyzal  (levocetirizine), or Allegra (fexofenadine) once a day as needed for runny nose Start Flonase  (fluticasone ) 1-2 sprays each nostril once a day as needed for stuffy.  In the right nostril, point the applicator out toward the right ear. In  the left nostril, point the applicator out toward the left ear Stop Rhinocort   Gastroesophageal reflux disease Continue dietary and lifestyle modifications Continue Protonix  as per your primary care physician.   Food allergy  - Continue to avoid banana. -Continue to carry epinephrine  auto-injector with you at all times.   -If  interested she can schedule an in office oral food challenge to banana. If  not interested in scheduling an in office oral food challenge to banana you need to continue to avoid bananas and have access to her epinephrine  autoinjector device. If interested you will need to bring a banana with you the day of the appointment. This appointment will last approximately 2 to 3 hours. You will need to be off all antihistamines 3 days prior to this appointment and be in good health. (No recent antibiotics or vaccines.)    Please let us  know if this treatment plan is not working well for you Schedule a follow-up appointment in 3-4 months or sooner if needed  Return in about 3 months (around 03/29/2024), or if symptoms worsen or fail to improve.    Thank you for the opportunity to care for this patient.  Please do not hesitate to contact me with questions.  Tinnie Forehand, FNP [facility] 

## 2023-12-30 ENCOUNTER — Encounter: Payer: Self-pay | Admitting: Family Medicine

## 2023-12-31 ENCOUNTER — Ambulatory Visit: Admitting: Family Medicine

## 2023-12-31 NOTE — Telephone Encounter (Addendum)
 Attn: Nurse Kim   Patient Tina [name] calling to get the update on her work excuse and discuss message she left in my chart.   Please advise

## 2024-01-01 NOTE — Telephone Encounter (Signed)
Message sent to Patient via [facility].

## 2024-01-16 ENCOUNTER — Encounter: Payer: Self-pay | Admitting: Obstetrics and Gynecology

## 2024-01-16 ENCOUNTER — Other Ambulatory Visit: Payer: Self-pay | Admitting: Obstetrics and Gynecology

## 2024-01-16 DIAGNOSIS — N63 Unspecified lump in unspecified breast: Secondary | ICD-10-CM

## 2024-01-23 ENCOUNTER — Other Ambulatory Visit: Payer: Self-pay | Admitting: Obstetrics and Gynecology

## 2024-01-23 DIAGNOSIS — N63 Unspecified lump in unspecified breast: Secondary | ICD-10-CM

## 2024-01-24 ENCOUNTER — Emergency Department (HOSPITAL_BASED_OUTPATIENT_CLINIC_OR_DEPARTMENT_OTHER)
Admission: EM | Admit: 2024-01-24 | Discharge: 2024-01-24 | Disposition: A | Discharge status: Home / Self Care | Attending: Emergency Medicine | Admitting: Emergency Medicine

## 2024-01-24 ENCOUNTER — Encounter (HOSPITAL_BASED_OUTPATIENT_CLINIC_OR_DEPARTMENT_OTHER): Payer: Self-pay

## 2024-01-24 ENCOUNTER — Emergency Department (HOSPITAL_BASED_OUTPATIENT_CLINIC_OR_DEPARTMENT_OTHER): Admitting: Radiology

## 2024-01-24 ENCOUNTER — Other Ambulatory Visit: Payer: Self-pay

## 2024-01-24 DIAGNOSIS — Z91040 Latex allergy status: Secondary | ICD-10-CM | POA: Diagnosis not present

## 2024-01-24 DIAGNOSIS — Z87891 Personal history of nicotine dependence: Secondary | ICD-10-CM | POA: Insufficient documentation

## 2024-01-24 DIAGNOSIS — M25512 Pain in left shoulder: Secondary | ICD-10-CM | POA: Insufficient documentation

## 2024-01-24 NOTE — Discharge Instructions (Signed)
 Wear the shoulder immobilizer at all times can remove it shower.  Make an appointment follow-up with orthopedics.  Continue the Motrin  up to 800 mg every 8 hours.  Work note provided for today.

## 2024-01-24 NOTE — ED Provider Notes (Addendum)
 [place] EMERGENCY DEPARTMENT AT [facility] Provider Note   CSN: [id number] Arrival date & time: 01/24/24  9370     Patient presents with: Arm Pain   Tina [name] is a 38 y.o. female.   Patient with a complaint of left shoulder pain for about 2 weeks.  Worse when she tries to raise her arm above her head or touch the top of her head.  No known injury.  No numbness or weakness to the hand.  Pain does radiate from the shoulder down into the arm but not below the elbow.  No neck pain.  Patient does not have an orthopedic surgeon that she is followed by.  Past medical history sniffing for asthma and history of heart murmur.  Patient is a former smoker of e-cigarettes.       Prior to Admission medications   Medication Sig Start Date End Date Taking? Authorizing Provider  albuterol  (VENTOLIN  HFA) 108 (90 Base) MCG/ACT inhaler 2 puffs every 4-6 hours as needed for coughing or wheezing spells 12/28/23   Cheryl Reusing, FNP  cyclobenzaprine  (FLEXERIL ) 5 MG tablet Take 1 tablet (5 mg total) by mouth 3 (three) times daily as needed for muscle spasms. 05/23/23   Tawkaliyar, Roya, DO  EPINEPHrine  (EPIPEN  2-PAK) 0.3 mg/0.3 mL IJ SOAJ injection Use as directed for severe allergic reactions 12/28/23   Cheryl Reusing, FNP  fluticasone  (FLONASE ) 50 MCG/ACT nasal spray Place 1 to 2 sprays in each nostril once a day as needed for stuffy nose 12/28/23   Cheryl Reusing, FNP  fluticasone  (FLOVENT  HFA) 110 MCG/ACT inhaler Inhale 2 puffs twice a day with spacer to help prevent cough and wheeze.  Rinse mouth out afterwards 12/28/23   Cheryl Reusing, FNP  lidocaine  (XYLOCAINE ) 2 % solution Use as directed 15 mLs in the mouth or throat as needed for mouth pain. 07/05/23   Veta Alan, PA-C  lubiprostone  (AMITIZA ) 24 MCG capsule TAKE 1 CAPSULE (24 MCG TOTAL) BY MOUTH 2 (TWO) TIMES DAILY WITH A MEAL. 10/22/23   Alvia Bring, DO  meloxicam  (MOBIC ) 7.5 MG tablet Take 1 tab every 12 hours as  needed. 06/20/23   Alvia Bring, DO  metaxalone  (SKELAXIN ) 800 MG tablet Take 1 tablet (800 mg total) by mouth 3 (three) times daily as needed for muscle spasms. 06/20/23   Alvia Bring, DO  methocarbamol  (ROBAXIN ) 500 MG tablet Take 1 tablet (500 mg total) by mouth 3 (three) times daily. 09/24/23   Willo Mini, NP  pantoprazole  (PROTONIX ) 40 MG tablet Take 1 tablet (40 mg total) by mouth daily. 09/24/23   Willo Mini, NP  Spacer/Aero-Holding Chambers DEVI 01 spacer 12/09/21   Cheryl Reusing, FNP    Allergies: Banana extract allergy  skin test, Banana, Cefazolin , Diphenhydramine , and Latex    Review of Systems  Constitutional:  Negative for chills and fever.  HENT:  Negative for ear pain and sore throat.   Eyes:  Negative for pain and visual disturbance.  Respiratory:  Negative for cough and shortness of breath.   Cardiovascular:  Negative for chest pain and palpitations.  Gastrointestinal:  Negative for abdominal pain and vomiting.  Genitourinary:  Negative for dysuria and hematuria.  Musculoskeletal:  Negative for arthralgias, back pain, joint swelling, neck pain and neck stiffness.  Skin:  Negative for color change and rash.  Neurological:  Negative for seizures, syncope, weakness and numbness.  All other systems reviewed and are negative.   Updated Vital Signs BP 119/85   Pulse 85   Temp  97.7 F (36.5 C) (Temporal)   Resp 18   Ht 1.524 m (5')   Wt 53.1 kg   LMP 01/16/2024   SpO2 100%   BMI 22.85 kg/m   Physical Exam Vitals and nursing note reviewed.  Constitutional:      General: She is not in acute distress.    Appearance: She is well-developed.  HENT:     Head: Normocephalic and atraumatic.  Eyes:     Conjunctiva/sclera: Conjunctivae normal.  Cardiovascular:     Rate and Rhythm: Normal rate and regular rhythm.     Heart sounds: No murmur heard. Pulmonary:     Effort: Pulmonary effort is normal. No respiratory distress.     Breath sounds: Normal breath sounds.   Abdominal:     Palpations: Abdomen is soft.     Tenderness: There is no abdominal tenderness.  Musculoskeletal:        General: No swelling.     Cervical back: Neck supple.     Comments: Radial pulse on left side 2+.  Good movement of fingers wrist elbow some limited range of motion at the shoulder particularly when raising the arm above the head.  No obvious deformity.  Sensation intact distally.  Skin:    General: Skin is warm and dry.     Capillary Refill: Capillary refill takes less than 2 seconds.  Neurological:     General: No focal deficit present.     Mental Status: She is alert and oriented to person, place, and time.     Cranial Nerves: No cranial nerve deficit.     Sensory: No sensory deficit.     Motor: No weakness.  Psychiatric:        Mood and Affect: Mood normal.     (all labs ordered are listed, but only abnormal results are displayed) Labs Reviewed - No data to display  EKG: None  Radiology: No results found.   Procedures   Medications Ordered in the ED - No data to display                                  Medical Decision Making Amount and/or Complexity of Data Reviewed Radiology: ordered.   Findings consistent with rotator cuff injury.  Will get x-ray of the left shoulder.  Will place her in a sling.  Have her follow-up with orthopedics.  X-ray of the left shoulder unremarkable as expected.   Final diagnoses:  Acute pain of left shoulder    ED Discharge Orders     None          Geraldene Hamilton, MD 01/24/24 [id number]    Geraldene Hamilton, MD 01/24/24 [phone]

## 2024-01-24 NOTE — ED Triage Notes (Signed)
 Pt POV d/t upper left arm from elbow to shoulder for 2 weeks.  Pt states she is trying all home remedies plus Ibuprofen  and tylenol  but nothing working.

## 2024-01-25 ENCOUNTER — Encounter: Payer: Self-pay | Admitting: Physical Therapy

## 2024-01-28 ENCOUNTER — Other Ambulatory Visit

## 2024-02-01 ENCOUNTER — Telehealth (INDEPENDENT_AMBULATORY_CARE_PROVIDER_SITE_OTHER): Admitting: Family Medicine

## 2024-02-01 ENCOUNTER — Encounter: Payer: Self-pay | Admitting: Family Medicine

## 2024-02-01 VITALS — Ht 60.0 in | Wt 117.0 lb

## 2024-02-01 DIAGNOSIS — M25512 Pain in left shoulder: Secondary | ICD-10-CM | POA: Diagnosis not present

## 2024-02-01 NOTE — Assessment & Plan Note (Addendum)
 Continue sling.  Adding meloxicam  to replace ibuprofen .  If not improving within a couple of weeks will refer to sports med.  Letter with restrictions provided.

## 2024-02-01 NOTE — Progress Notes (Signed)
 Pain progressed over a few weeks. Urgent Care states rotator cuff. Urgent Care gave sling and ibuprofen .

## 2024-02-01 NOTE — Progress Notes (Signed)
 Tina [name] - 38 y.o. female MRN [id number]  Date of birth: 19-Jan-1986   All issues noted in this document were discussed and addressed.  No physical exam was performed (except for noted visual exam findings with Video Visits).  I discussed the limitations of evaluation and management by telemedicine and the availability of in person appointments. The patient expressed understanding and agreed to proceed.  I connected withNAME@ on 02/01/24 at 10:30 AM EDT by a video enabled telemedicine application and verified that I am speaking with the correct person using two identifiers.  Present at visit: Velma Ku, DO Alan Harlene Badger   Patient Location: Home [address] [place] [address]   Provider location:   [facility]  Chief Complaint  Patient presents with   Shoulder Pain    HPI  Tina [name] is a 38 y.o. female who presents via audio/video conferencing for a telehealth visit today.  She has had L shoulder pain for the past couple of weeks.  She was seen at urgent care and provided with sling and instructed to continue ibuprofen .  She reports that sling has helped some but she continues to have pain.  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  She is asking for note with temporary restrictions.     ROS:  A comprehensive ROS was completed and negative except as noted per HPI  Past Medical History:  Diagnosis Date   Allergy     Asthma    Heart murmur     Past Surgical History:  Procedure Laterality Date   BREAST EXCISIONAL BIOPSY Left 2007   CESAREAN SECTION N/A 10/20/2020   Procedure: CESAREAN SECTION;  Surgeon: Horacio Boas, MD;  Location: [facility] LD ORS;  Service: Obstetrics;  Laterality: N/A;  FHR Decelerations   COLPOSCOPY     LAPAROSCOPY     WISDOM TOOTH EXTRACTION      Family History  Problem Relation Age of Onset   Diabetes Mother    Hypertension Father    Asthma Father     Diabetes Father    Cancer Maternal Aunt        colon   Lupus Maternal Aunt    Diabetes Maternal Aunt    Breast cancer Maternal Grandmother    Breast cancer Paternal Grandmother    Diabetes Paternal Grandmother    Hypertension Maternal Aunt    Diabetes Maternal Aunt    Diabetes Maternal Aunt    Allergic rhinitis Sister    Food Allergy  Sister    Eczema Sister    Asthma Maternal Uncle    Diabetes Paternal Grandfather     Social History   Socioeconomic History   Marital status: Single    Spouse name: Not on file   Number of children: Not on file   Years of education: Not on file   Highest education level: Not on file  Occupational History   Not on file  Tobacco Use   Smoking status: Former    Types: E-cigarettes   Smokeless tobacco: Never  Vaping Use   Vaping status: Never Used  Substance and Sexual Activity   Alcohol use: Not Currently    Comment: occ   Drug use: No   Sexual activity: Yes    Birth control/protection: Condom  Other Topics Concern   Not on file  Social History Narrative   Not on file   Social Drivers of Health   Financial Resource Strain: Low  Risk  (02/04/2019)   Overall Financial Resource Strain (CARDIA)    Difficulty of Paying Living Expenses: Not hard at all  Food Insecurity: No Food Insecurity (12/27/2023)   Hunger Vital Sign    Worried About Running Out of Food in the Last Year: Never true    Ran Out of Food in the Last Year: Never true  Transportation Needs: No Transportation Needs (12/27/2023)   PRAPARE - Administrator, Civil Service (Medical): No    Lack of Transportation (Non-Medical): No  Physical Activity: Inactive (08/23/2017)   Exercise Vital Sign    Days of Exercise per Week: 0 days    Minutes of Exercise per Session: 0 min  Stress: No Stress Concern Present (08/23/2017)   [employer] of Occupational Health - Occupational Stress Questionnaire    Feeling of Stress : Not at all  Social Connections: Moderately  Isolated (12/27/2023)   Social Connection and Isolation Panel    Frequency of Communication with Friends and Family: Three times a week    Frequency of Social Gatherings with Friends and Family: Once a week    Attends Religious Services: Never    Database administrator or Organizations: Yes    Attends Banker Meetings: 1 to 4 times per year    Marital Status: Never married  Intimate Partner Violence: Not At Risk (12/27/2023)   Humiliation, Afraid, Rape, and Kick questionnaire    Fear of Current or Ex-Partner: No    Emotionally Abused: No    Physically Abused: No    Sexually Abused: No     Current Outpatient Medications:    albuterol  (VENTOLIN  HFA) 108 (90 Base) MCG/ACT inhaler, 2 puffs every 4-6 hours as needed for coughing or wheezing spells, Disp: 18 g, Rfl: 1   cyclobenzaprine  (FLEXERIL ) 5 MG tablet, Take 1 tablet (5 mg total) by mouth 3 (three) times daily as needed for muscle spasms., Disp: 30 tablet, Rfl: 0   EPINEPHrine  (EPIPEN  2-PAK) 0.3 mg/0.3 mL IJ SOAJ injection, Use as directed for severe allergic reactions, Disp: 2 each, Rfl: 1   fluticasone  (FLONASE ) 50 MCG/ACT nasal spray, Place 1 to 2 sprays in each nostril once a day as needed for stuffy nose, Disp: 16 g, Rfl: 5   fluticasone  (FLOVENT  HFA) 110 MCG/ACT inhaler, Inhale 2 puffs twice a day with spacer to help prevent cough and wheeze.  Rinse mouth out afterwards, Disp: 1 each, Rfl: 5   lidocaine  (XYLOCAINE ) 2 % solution, Use as directed 15 mLs in the mouth or throat as needed for mouth pain., Disp: 15 mL, Rfl: 0   lubiprostone  (AMITIZA ) 24 MCG capsule, TAKE 1 CAPSULE (24 MCG TOTAL) BY MOUTH 2 (TWO) TIMES DAILY WITH A MEAL., Disp: 180 capsule, Rfl: 1   meloxicam  (MOBIC ) 7.5 MG tablet, Take 1 tab every 12 hours as needed., Disp: 30 tablet, Rfl: 1   metaxalone  (SKELAXIN ) 800 MG tablet, Take 1 tablet (800 mg total) by mouth 3 (three) times daily as needed for muscle spasms., Disp: 90 tablet, Rfl: 0   methocarbamol   (ROBAXIN ) 500 MG tablet, Take 1 tablet (500 mg total) by mouth 3 (three) times daily., Disp: 90 tablet, Rfl: 0   pantoprazole  (PROTONIX ) 40 MG tablet, Take 1 tablet (40 mg total) by mouth daily., Disp: 30 tablet, Rfl: 3   Spacer/Aero-Holding Chambers DEVI, 01 spacer, Disp: 1 each, Rfl: 1  EXAM:  VITALS per patient if applicable: Ht 5' (1.524 m)   Wt 117 lb (53.1 kg)  LMP 01/16/2024   BMI 22.85 kg/m   GENERAL: alert, oriented, appears well and in no acute distress  HEENT: atraumatic, conjunttiva clear, no obvious abnormalities on inspection of external nose and ears  NECK: normal movements of the head and neck  LUNGS: on inspection no signs of respiratory distress, breathing rate appears normal, no obvious gross SOB, gasping or wheezing  CV: no obvious cyanosis  MS: moves all visible extremities without noticeable abnormality  PSYCH/NEURO: pleasant and cooperative, no obvious depression or anxiety, speech and thought processing grossly intact  ASSESSMENT AND PLAN:  Discussed the following assessment and plan:  Left shoulder pain Continue sling.  Adding meloxicam  to replace ibuprofen .  If not improving within a couple of weeks will refer to sports med.  Letter with restrictions provided.      I discussed the assessment and treatment plan with the patient. The patient was provided an opportunity to ask questions and all were answered. The patient agreed with the plan and demonstrated an understanding of the instructions.   The patient was advised to call back or seek an in-person evaluation if the symptoms worsen or if the condition fails to improve as anticipated.    Velma Ku, DO

## 2024-02-04 ENCOUNTER — Ambulatory Visit
Admission: RE | Admit: 2024-02-04 | Discharge: 2024-02-04 | Disposition: A | Discharge status: Home / Self Care | Source: Ambulatory Visit | Attending: Obstetrics and Gynecology | Admitting: Obstetrics and Gynecology

## 2024-02-04 ENCOUNTER — Ambulatory Visit
Admission: RE | Admit: 2024-02-04 | Discharge: 2024-02-04 | Disposition: A | Discharge status: Home / Self Care | Source: Ambulatory Visit | Attending: Obstetrics and Gynecology

## 2024-02-04 DIAGNOSIS — N63 Unspecified lump in unspecified breast: Secondary | ICD-10-CM

## 2024-02-21 ENCOUNTER — Ambulatory Visit: Attending: Family Medicine

## 2024-02-21 ENCOUNTER — Other Ambulatory Visit: Payer: Self-pay | Admitting: *Deleted

## 2024-02-21 DIAGNOSIS — R002 Palpitations: Secondary | ICD-10-CM

## 2024-02-21 NOTE — Progress Notes (Unsigned)
 Original order from 12/21/2022 expired.  Patient did not apply monitor until 12/28/2023. New order entered to import results , assign MD to review, and enter charges for interpretation.

## 2024-02-27 DIAGNOSIS — R002 Palpitations: Secondary | ICD-10-CM | POA: Diagnosis not present

## 2024-02-29 ENCOUNTER — Ambulatory Visit: Payer: Self-pay | Admitting: Family Medicine

## 2024-03-03 ENCOUNTER — Ambulatory Visit: Payer: Self-pay

## 2024-03-03 ENCOUNTER — Ambulatory Visit: Admitting: Family Medicine

## 2024-03-03 ENCOUNTER — Encounter: Payer: Self-pay | Admitting: Family Medicine

## 2024-03-03 VITALS — BP 137/81 | HR 79 | Ht 60.0 in | Wt 123.0 lb

## 2024-03-03 DIAGNOSIS — H00011 Hordeolum externum right upper eyelid: Secondary | ICD-10-CM

## 2024-03-03 DIAGNOSIS — H109 Unspecified conjunctivitis: Secondary | ICD-10-CM | POA: Diagnosis not present

## 2024-03-03 DIAGNOSIS — H00019 Hordeolum externum unspecified eye, unspecified eyelid: Secondary | ICD-10-CM | POA: Insufficient documentation

## 2024-03-03 MED ORDER — ERYTHROMYCIN 5 MG/GM OP OINT: 1.0000 | TOPICAL_OINTMENT | Freq: Three times a day (TID) | OPHTHALMIC | 0 refills | Status: DC

## 2024-03-03 NOTE — Assessment & Plan Note (Signed)
 Small hordeolum and also appears to have conjunctivitis.  Adding erythromycin  ointment.  Continue warm compresses.  Red flags reviewed.

## 2024-03-03 NOTE — Progress Notes (Signed)
 Tina [name] - 38 y.o. female MRN [id number]  Date of birth: 05/25/1986  Subjective Chief Complaint  Patient presents with   Stye    HPI Tina [name] is a 38 y.o. female here today with complaint R eye pain.  Symptoms started yesterday.  She has had discharge and matting of the R eye.  History of hordeolum in the past and this feels similar.  .  So far she has tried warm compress which seems to help some.  Vision is ok.  Occasional floaters but no significant changes.   Denies fever, headache or sinus pain/pressure. .   ROS:  A comprehensive ROS was completed and negative except as noted per HPI  Allergies  Allergen Reactions   Banana Extract Allergy  Skin Test Anaphylaxis   Banana Swelling    Throat swelling   Cefazolin  Swelling    Facial edema (no airway edema) during c-section after ancef . Has not been formally tested for cephalosporin allergy .    Diphenhydramine  Hives   Latex Hives and Swelling    Past Medical History:  Diagnosis Date   Allergy     Asthma    Heart murmur     Past Surgical History:  Procedure Laterality Date   BREAST EXCISIONAL BIOPSY Left 2007   CESAREAN SECTION N/A 10/20/2020   Procedure: CESAREAN SECTION;  Surgeon: Horacio Boas, MD;  Location: [facility] LD ORS;  Service: Obstetrics;  Laterality: N/A;  FHR Decelerations   COLPOSCOPY     LAPAROSCOPY     WISDOM TOOTH EXTRACTION      Social History   Socioeconomic History   Marital status: Single    Spouse name: Not on file   Number of children: Not on file   Years of education: Not on file   Highest education level: Not on file  Occupational History   Not on file  Tobacco Use   Smoking status: Former    Types: E-cigarettes   Smokeless tobacco: Never  Vaping Use   Vaping status: Never Used  Substance and Sexual Activity   Alcohol use: Not Currently    Comment: occ   Drug use: No   Sexual activity: Yes    Birth control/protection: Condom  Other Topics Concern   Not on file   Social History Narrative   Not on file   Social Drivers of Health   Financial Resource Strain: Low Risk  (02/04/2019)   Overall Financial Resource Strain (CARDIA)    Difficulty of Paying Living Expenses: Not hard at all  Food Insecurity: No Food Insecurity (12/27/2023)   Hunger Vital Sign    Worried About Running Out of Food in the Last Year: Never true    Ran Out of Food in the Last Year: Never true  Transportation Needs: No Transportation Needs (12/27/2023)   PRAPARE - Administrator, Civil Service (Medical): No    Lack of Transportation (Non-Medical): No  Physical Activity: Inactive (08/23/2017)   Exercise Vital Sign    Days of Exercise per Week: 0 days    Minutes of Exercise per Session: 0 min  Stress: No Stress Concern Present (08/23/2017)   [employer] of Occupational Health - Occupational Stress Questionnaire    Feeling of Stress : Not at all  Social Connections: Moderately Isolated (12/27/2023)   Social Connection and Isolation Panel    Frequency of Communication with Friends and Family: Three times a week    Frequency of Social Gatherings with Friends and Family: Once a week  Attends Religious Services: Never    Active Member of Clubs or Organizations: Yes    Attends Banker Meetings: 1 to 4 times per year    Marital Status: Never married    Family History  Problem Relation Age of Onset   Diabetes Mother    Hypertension Father    Asthma Father    Diabetes Father    Cancer Maternal Aunt        colon   Lupus Maternal Aunt    Diabetes Maternal Aunt    Breast cancer Maternal Grandmother    Breast cancer Paternal Grandmother    Diabetes Paternal Grandmother    Hypertension Maternal Aunt    Diabetes Maternal Aunt    Diabetes Maternal Aunt    Allergic rhinitis Sister    Food Allergy  Sister    Eczema Sister    Asthma Maternal Uncle    Diabetes Paternal Grandfather     Health Maintenance  Topic Date Due   COVID-19 Vaccine (1) Never  done   DTaP/Tdap/Td (1 - Tdap) Never done   Pneumococcal Vaccine (1 of 2 - PCV) Never done   Hepatitis B Vaccines 19-59 Average Risk (1 of 3 - 19+ 3-dose series) Never done   HPV VACCINES (1 - Risk 3-dose SCDM series) Never done   Cervical Cancer Screening (HPV/Pap Cotest)  Never done   INFLUENZA VACCINE  02/08/2024   Hepatitis C Screening  Completed   HIV Screening  Completed   Meningococcal B Vaccine  Aged Out     ----------------------------------------------------------------------------------------------------------------------------------------------------------------------------------------------------------------- Physical Exam BP 137/81 (BP Location: Left Arm, Patient Position: Sitting, Cuff Size: Small)   Pulse 79   Ht 5' (1.524 m)   Wt 123 lb (55.8 kg)   SpO2 100%   BMI 24.02 kg/m   Physical Exam Constitutional:      Appearance: Normal appearance.  Eyes:     Comments: R eye with small hordeolum of upper lid.   Mild conjunctival injection.   Neurological:     Mental Status: She is alert.     ------------------------------------------------------------------------------------------------------------------------------------------------------------------------------------------------------------------- Assessment and Plan  Hordeolum Small hordeolum and also appears to have conjunctivitis.  Adding erythromycin  ointment.  Continue warm compresses.  Red flags reviewed.    Meds ordered this encounter  Medications   erythromycin  ophthalmic ointment    Sig: Place 1 Application into the right eye 3 (three) times daily.    Dispense:  3.5 g    Refill:  0    No follow-ups on file.

## 2024-03-03 NOTE — Telephone Encounter (Signed)
 FYI Only or Action Required?: FYI only for provider.  Patient was last seen in primary care on 02/01/2024 by Alvia Bring, DO.  Called Nurse Triage reporting Conjunctivitis, Eye Drainage, Eye Pain, and Belepharitis.  Symptoms began yesterday.  Interventions attempted: Ice/heat application.  Symptoms are: gradually worsening.  Triage Disposition: See HCP Within 4 Hours (Or PCP Triage)  Patient/caregiver understands and will follow disposition?: Yes     Copied from CRM #[id number]. Topic: Clinical - Red Word Triage >> Mar 03, 2024  9:04 AM Miquel SAILOR wrote: Red Word that prompted transfer to Nurse Triage: conjunctivitis issue last 2023 symptoms: discharge yellow of corners of RT eye Reason for Disposition  MODERATE eye pain (e.g., interferes with normal activities)  Answer Assessment - Initial Assessment Questions 1. EYE DISCHARGE: Is the discharge in one or both eyes? What color is it? How much is there? When did the discharge start?      Right eye, yellow discharge started this morning 2. REDNESS OF SCLERA: Is there redness in the white of the eye? If Yes, ask: Is it in one or both eyes? When did the redness start?     Was yesterday bottom part 3. EYELIDS: Are the eyelids red or swollen? If Yes, ask: How much?      Top of eyelid swollen, I can tell it's swollen because of hx 4. VISION: Do you have blurred vision?     no 5. PAIN: Is there any pain? If Yes, ask: How bad is the pain? (Scale 0-10; or none, mild, moderate, severe)     Started yesterday, discharge this morning, hx sty every couple years feels like beginning stages of it Tender to touch for eyelid or eye ball 5-6/10 if try to touch it 6. CONTACT LENS: Do you wear contacts?     Glasses, no contacts 7. OTHER SYMPTOMS: Do you have any other symptoms? (e.g., fever, runny nose, cough)     No other symptoms  Protocols used: Eye - Pus or Discharge-A-AH

## 2024-03-11 ENCOUNTER — Encounter: Payer: Self-pay | Admitting: Sports Medicine

## 2024-03-24 ENCOUNTER — Encounter: Payer: Self-pay | Admitting: Family Medicine

## 2024-03-27 ENCOUNTER — Ambulatory Visit: Admitting: Family Medicine

## 2024-03-27 ENCOUNTER — Encounter: Payer: Self-pay | Admitting: Family Medicine

## 2024-03-27 VITALS — BP 126/74 | HR 78 | Ht 60.0 in | Wt 123.0 lb

## 2024-03-27 DIAGNOSIS — R8290 Unspecified abnormal findings in urine: Secondary | ICD-10-CM

## 2024-03-27 DIAGNOSIS — M5136 Other intervertebral disc degeneration, lumbar region with discogenic back pain only: Secondary | ICD-10-CM | POA: Diagnosis not present

## 2024-03-27 LAB — POCT URINALYSIS DIP (CLINITEK)
Bilirubin, UA: NEGATIVE
Blood, UA: NEGATIVE
Glucose, UA: NEGATIVE mg/dL
Ketones, POC UA: NEGATIVE mg/dL
Leukocytes, UA: NEGATIVE
Nitrite, UA: NEGATIVE
POC PROTEIN,UA: NEGATIVE
Spec Grav, UA: 1.02 (ref 1.010–1.025)
Urobilinogen, UA: 1 U/dL
pH, UA: 7 (ref 5.0–8.0)

## 2024-03-27 NOTE — Assessment & Plan Note (Signed)
 Continue home exercises.  Urine is unremarkable today  Recheck renal function given complaints of decreased urination.  Encouraged good fluid intake.

## 2024-03-27 NOTE — Progress Notes (Signed)
 Tina [name] - 38 y.o. female MRN [id number]  Date of birth: 15-Jun-1986  Subjective Chief Complaint  Patient presents with   Urinary Concern   Back Pain    HPI Tina [name] is  38 y.o. female here today with complaint of continued back and flank pain as well as decreased urination with foamy urine and abnormal odor.  She has been trying exercises for her back without much relief.  She denies pain with urination.  She has not noticed blood in her urine and denies fever or chills.  Previous CT of lumbar spine with DDD with facet arthropathy as well as SI joint degenerative changes.  She denies radiation of pain, numbness and/or tingling.   ROS:  A comprehensive ROS was completed and negative except as noted per HPI  Allergies  Allergen Reactions   Banana Extract Allergy  Skin Test Anaphylaxis   Banana Swelling    Throat swelling   Cefazolin  Swelling    Facial edema (no airway edema) during c-section after ancef . Has not been formally tested for cephalosporin allergy .    Diphenhydramine  Hives   Latex Hives and Swelling    Past Medical History:  Diagnosis Date   Allergy     Asthma    Heart murmur     Past Surgical History:  Procedure Laterality Date   BREAST EXCISIONAL BIOPSY Left 2007   CESAREAN SECTION N/A 10/20/2020   Procedure: CESAREAN SECTION;  Surgeon: Horacio Boas, MD;  Location: [facility];  Service: Obstetrics;  Laterality: N/A;  FHR Decelerations   COLPOSCOPY     LAPAROSCOPY     WISDOM TOOTH EXTRACTION      Social History   Socioeconomic History   Marital status: Single    Spouse name: Not on file   Number of children: Not on file   Years of education: Not on file   Highest education level: Not on file  Occupational History   Not on file  Tobacco Use   Smoking status: Former    Types: E-cigarettes   Smokeless tobacco: Never  Vaping Use   Vaping status: Never Used  Substance and Sexual Activity   Alcohol use: Not Currently    Comment:  occ   Drug use: No   Sexual activity: Yes    Birth control/protection: Condom  Other Topics Concern   Not on file  Social History Narrative   Not on file   Social Drivers of Health   Financial Resource Strain: Low Risk  (02/04/2019)   Overall Financial Resource Strain (CARDIA)    Difficulty of Paying Living Expenses: Not hard at all  Food Insecurity: No Food Insecurity (12/27/2023)   Hunger Vital Sign    Worried About Running Out of Food in the Last Year: Never true    Ran Out of Food in the Last Year: Never true  Transportation Needs: No Transportation Needs (12/27/2023)   PRAPARE - Administrator, Civil Service (Medical): No    Lack of Transportation (Non-Medical): No  Physical Activity: Inactive (08/23/2017)   Exercise Vital Sign    Days of Exercise per Week: 0 days    Minutes of Exercise per Session: 0 min  Stress: No Stress Concern Present (08/23/2017)   [employer] of Occupational Health - Occupational Stress Questionnaire    Feeling of Stress : Not at all  Social Connections: Moderately Isolated (12/27/2023)   Social Connection and Isolation Panel    Frequency of Communication with Friends and Family: Three times a week  Frequency of Social Gatherings with Friends and Family: Once a week    Attends Religious Services: Never    Database administrator or Organizations: Yes    Attends Engineer, structural: 1 to 4 times per year    Marital Status: Never married    Family History  Problem Relation Age of Onset   Diabetes Mother    Hypertension Father    Asthma Father    Diabetes Father    Cancer Maternal Aunt        colon   Lupus Maternal Aunt    Diabetes Maternal Aunt    Breast cancer Maternal Grandmother    Breast cancer Paternal Grandmother    Diabetes Paternal Grandmother    Hypertension Maternal Aunt    Diabetes Maternal Aunt    Diabetes Maternal Aunt    Allergic rhinitis Sister    Food Allergy  Sister    Eczema Sister    Asthma  Maternal Uncle    Diabetes Paternal Grandfather     Health Maintenance  Topic Date Due   COVID-19 Vaccine (1) Never done   DTaP/Tdap/Td (1 - Tdap) Never done   Pneumococcal Vaccine (1 of 2 - PCV) Never done   Hepatitis B Vaccines 19-59 Average Risk (1 of 3 - 19+ 3-dose series) Never done   HPV VACCINES (1 - Risk 3-dose SCDM series) Never done   Cervical Cancer Screening (HPV/Pap Cotest)  Never done   Influenza Vaccine  Never done   Hepatitis C Screening  Completed   HIV Screening  Completed   Meningococcal B Vaccine  Aged Out     ----------------------------------------------------------------------------------------------------------------------------------------------------------------------------------------------------------------- Physical Exam BP 126/74 (BP Location: Left Arm, Patient Position: Sitting, Cuff Size: Small)   Pulse 78   Ht 5' (1.524 m)   Wt 123 lb (55.8 kg)   SpO2 100%   BMI 24.02 kg/m   Physical Exam Constitutional:      Appearance: Normal appearance.  Cardiovascular:     Rate and Rhythm: Normal rate and regular rhythm.  Pulmonary:     Effort: Pulmonary effort is normal.     Breath sounds: Normal breath sounds.  Neurological:     General: No focal deficit present.     Mental Status: She is alert.  Psychiatric:        Mood and Affect: Mood normal.        Behavior: Behavior normal.     ------------------------------------------------------------------------------------------------------------------------------------------------------------------------------------------------------------------- Assessment and Plan  Discogenic low back pain Continue home exercises.  Urine is unremarkable today  Recheck renal function given complaints of decreased urination.  Encouraged good fluid intake.    No orders of the defined types were placed in this encounter.   No follow-ups on file.

## 2024-03-28 LAB — CMP14+EGFR
ALT: 12 IU/L (ref 0–32)
AST: 14 IU/L (ref 0–40)
Albumin: 4.3 g/dL (ref 3.9–4.9)
Alkaline Phosphatase: 36 IU/L — ABNORMAL LOW (ref 41–116)
BUN/Creatinine Ratio: 14 (ref 9–23)
BUN: 10 mg/dL (ref 6–20)
Bilirubin Total: <0.2 mg/dL (ref 0.0–1.2)
CO2: 22 mmol/L (ref 20–29)
Calcium: 9.1 mg/dL (ref 8.7–10.2)
Chloride: 102 mmol/L (ref 96–106)
Creatinine, Ser: 0.7 mg/dL (ref 0.57–1.00)
Globulin, Total: 2.7 g/dL (ref 1.5–4.5)
Glucose: 84 mg/dL (ref 70–99)
Potassium: 4.4 mmol/L (ref 3.5–5.2)
Sodium: 136 mmol/L (ref 134–144)
Total Protein: 7 g/dL (ref 6.0–8.5)
eGFR: 114 mL/min/1.73 (ref >59)

## 2024-03-29 LAB — URINE CULTURE

## 2024-04-03 ENCOUNTER — Telehealth: Payer: Self-pay

## 2024-04-03 DIAGNOSIS — M5136 Other intervertebral disc degeneration, lumbar region with discogenic back pain only: Secondary | ICD-10-CM

## 2024-04-03 NOTE — Telephone Encounter (Signed)
 Patient informed of results as written by Dr. Alvia.  No growth on urine culture and normal kidney function on lab work  She states she will send the provider a Mychart message as she forgot what he had suggested for the next steps if kidney function was normal.

## 2024-04-03 NOTE — Telephone Encounter (Signed)
 Lab results not reviewed by provider Forwarding to Dr. Alvia for review and then I will contact patient.

## 2024-04-03 NOTE — Telephone Encounter (Signed)
 Copied from CRM [id number]. Topic: Clinical - Lab/Test Results >> Apr 03, 2024 11:25 AM Carlatta H wrote: Reason for CRM: Please call patient about lab results

## 2024-04-04 NOTE — Telephone Encounter (Signed)
 Patient is agreeable to physical therapy referral  States she has had 7 sessions already this year with [facility]  and will need to know that this will be covered again.  I told her to let the PT facility know this and see if they could check and see if she would still be eligible for more sessions.   Pended referral for PT

## 2024-04-13 NOTE — Therapy (Incomplete)
 OUTPATIENT PHYSICAL THERAPY THORACOLUMBAR EVALUATION   Patient Name: Tina [name] MRN: [id number] DOB:10/16/1985, 38 y.o., female Today's Date: 04/13/2024  END OF SESSION:   Past Medical History:  Diagnosis Date   Allergy     Asthma    Heart murmur    Past Surgical History:  Procedure Laterality Date   BREAST EXCISIONAL BIOPSY Left 2007   CESAREAN SECTION N/A 10/20/2020   Procedure: CESAREAN SECTION;  Surgeon: Horacio Boas, MD;  Location: [facility];  Service: Obstetrics;  Laterality: N/A;  FHR Decelerations   COLPOSCOPY     LAPAROSCOPY     WISDOM TOOTH EXTRACTION     Patient Active Problem List   Diagnosis Date Noted   Hordeolum 03/03/2024   Conjunctivitis 03/03/2024   Left shoulder pain 02/01/2024   Viral URI 12/27/2023   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   Abdominal discomfort in flank 04/10/2023   Palpitations 12/21/2022   Temporal pain 08/28/2022   Left lateral abdominal pain 05/30/2022   Sinobronchitis 05/02/2022   Epistaxis 03/15/2022   Migraines 02/02/2022   CTS (carpal tunnel syndrome) 08/25/2020   Cardiac murmur 04/28/2020   Subcutaneous mass 12/23/2019   GERD (gastroesophageal reflux disease) 07/23/2019   Asthma exacerbation 02/19/2019   Well adult exam 02/04/2019   Seasonal and perennial allergic rhinitis 01/22/2019   Food allergy  01/22/2019   Mild persistent asthma 01/14/2019    PCP: Alvia Bring, DO   REFERRING PROVIDER: Alvia Bring, DO   REFERRING DIAG: M51.360 (ICD-10-CM) - Discogenic low back pain  THERAPY DIAG:  No diagnosis found.  RATIONALE FOR EVALUATION AND TREATMENT: Rehabilitation  ONSET DATE: 10/24  NEXT MD VISIT: ***   SUBJECTIVE:                                                                                                                                                                                                         SUBJECTIVE STATEMENT: 38 y/o patient referred to PT from PCP for discogenic back  pain.   She reports began having pain about a year ago.   Did some PT in January of this year at the [facility] location with good results.   From notes appears she did a lot of core strength/stabilization and lumbar flexion exercises.    PAIN: Are you having pain? Yes: NPRS scale: *** Pain location: *** Pain description: *** Aggravating factors: *** Relieving factors: ***  PERTINENT HISTORY:  Asthma, heart murmur, migraines, palpitations  PRECAUTIONS: {Therapy precautions:24002}  RED FLAGS: {PT Red Flags:29287}  WEIGHT BEARING RESTRICTIONS: {Yes ***/No:24003}  FALLS:  Has patient  fallen in last 6 months? {fallsyesno:27318}  LIVING ENVIRONMENT: Lives with: {[facility] lives with:25569::lives with their family} Lives in: {Lives in:25570} Stairs: {opstairs:27293} Has following equipment at home: {Assistive devices:23999}  OCCUPATION: ***  PLOF: {PLOF:24004}  PATIENT GOALS: ***   OBJECTIVE: (objective measures completed at initial evaluation unless otherwise dated)  DIAGNOSTIC FINDINGS:  CLINICAL DATA:  Lumbar radiculopathy.  Back pain.   EXAM: CT LUMBAR SPINE WITHOUT CONTRAST   TECHNIQUE: Multidetector CT imaging of the lumbar spine was performed without intravenous contrast administration. Multiplanar CT image reconstructions were also generated.   RADIATION DOSE REDUCTION: This exam was performed according to the departmental dose-optimization program which includes automated exposure control, adjustment of the mA and/or kV according to patient size and/or use of iterative reconstruction technique.   COMPARISON:  None Available.   FINDINGS: Segmentation: Conventional numbering is assumed with 5 non-rib-bearing, lumbar type vertebral bodies.   Alignment: Normal.   Vertebrae: Normal vertebral body heights. No acute fracture or suspicious bone lesion.   Paraspinal and other soft tissues: Unremarkable.   Disc levels: Moderate degenerative changes of  the right-greater-than-left sacroiliac joints.   T12-L1:  Normal.   L1-L2:  Normal.   L2-L3:  Normal.   L3-L4:  Normal.   L4-L5: Small disc bulge and mild bilateral facet arthropathy. No spinal canal stenosis or neural foraminal narrowing.   L5-S1: Disc bulge and left-greater-than-right facet arthropathy results in moderate left neural foraminal narrowing.   IMPRESSION: 1. No acute fracture or traumatic listhesis of the lumbar spine. 2. Disc bulge and left-greater-than-right facet arthropathy at L5-S1 results in moderate left neural foraminal narrowing. 3. Moderate degenerative changes of the right-greater-than-left sacroiliac joints.     Electronically Signed   By: Ryan Chess M.D.   On: 05/28/2023 11:40  PATIENT SURVEYS:  {rehab surveys:24030}  SCREENING FOR RED FLAGS: Bowel or bladder incontinence: {Yes/No:304960894} Spinal tumors: {Yes/No:304960894} Cauda equina syndrome: {Yes/No:304960894} Compression fracture: {Yes/No:304960894} Abdominal aneurysm: {Yes/No:304960894}  COGNITION:  Overall cognitive status: {cognition:24006}    SENSATION: {sensation:27233}  POSTURE:  {posture:25561}  PALPATION: ***  LUMBAR ROM:   {AROM/PROM:27142}  Eval  Flexion   Extension   Right lateral flexion   Left lateral flexion   Right rotation   Left rotation   (Blank rows = not tested)  MUSCLE LENGTH: Hamstrings: Right *** deg; Left *** deg Thomas test: Right *** deg; Left *** deg Hamstrings: *** ITB: *** Piriformis: *** Hip flexors: *** Quads: *** Heelcord: ***  LOWER EXTREMITY ROM:     {AROM/PROM:27142}  Right eval Left eval  Hip flexion    Hip extension    Hip abduction    Hip adduction    Hip internal rotation    Hip external rotation    Knee flexion    Knee extension    Ankle dorsiflexion    Ankle plantarflexion    Ankle inversion    Ankle eversion    (Blank rows = not tested)  LOWER EXTREMITY MMT:    MMT Right eval Left eval  Hip  flexion    Hip extension    Hip abduction    Hip adduction    Hip internal rotation    Hip external rotation    Knee flexion    Knee extension    Ankle dorsiflexion    Ankle plantarflexion    Ankle inversion    Ankle eversion     (Blank rows = not tested)  LUMBAR SPECIAL TESTS:  {lumbar special test:25242}  FUNCTIONAL TESTS:  {Functional tests:24029}  GAIT:  Distance walked: *** Assistive device utilized: {Assistive devices:23999} Level of assistance: {Levels of assistance:24026} Gait pattern: {gait characteristics:25376} Comments: ***   TODAY'S TREATMENT:   ***   PATIENT EDUCATION:  Education details: {Education details:27468}  Person educated: {Person educated:25204} Education method: {Education Method EU:67125} Education comprehension: {Education Comprehension:25206}  HOME EXERCISE PROGRAM: ***   ASSESSMENT:  CLINICAL IMPRESSION: Almedia Cordell is a 38 y.o. female who was referred to physical therapy for evaluation and treatment for discogenic LBP.  Lumbar CT from 11/24 shows L5/S1 disc bulging with L foraminal stenosis and B facet arthropathy.   Patient reports onset of *** pain beginning ***. Pain is worse with ***.  Patient has deficits in *** ROM, *** LE flexibility, *** strength, abnormal posture, and TTP with abnormal muscle tension *** which are interfering with ADLs and are impacting quality of life.  On Modified Oswestry patient scored ***/50 demonstrating ***% or *** disability.  Huyen will benefit from skilled PT to address above deficits to improve mobility and activity tolerance with decreased pain interference.   ***  OBJECTIVE IMPAIRMENTS: {opptimpairments:25111}.   ACTIVITY LIMITATIONS: {activitylimitations:27494}  PARTICIPATION LIMITATIONS: {participationrestrictions:25113}  PERSONAL FACTORS: Fitness, Time since onset of injury/illness/exacerbation, and 1-2 comorbidities: Asthma, heart murmur, migraines, palpitations are also affecting  patient's functional outcome.   REHAB POTENTIAL: Good  CLINICAL DECISION MAKING: Evolving/moderate complexity  EVALUATION COMPLEXITY: Moderate   GOALS: Goals reviewed with patient? Yes  SHORT TERM GOALS: Target date: ***  Patient will be independent with initial HEP to improve outcomes and carryover.  Baseline: 100% PT assist required for correct completion Goal status: INITIAL  2.  Patient will report 25% improvement in low back pain to improve QOL. Baseline: *** Goal status: INITIAL    LONG TERM GOALS: Target date: ***  Patient will be independent with ongoing/advanced HEP for self-management at home.  Baseline: no advanced HEP yet Goal status: INITIAL  2.  Patient will report 50-75% improvement in low back pain to improve QOL.  Baseline: *** Goal status: INITIAL  3.  Patient to demonstrate ability to achieve and maintain good spinal alignment/posturing and body mechanics needed for daily activities. Baseline: *** Goal status: INITIAL  4.  Patient will demonstrate full pain free lumbar ROM to perform ADLs.   Baseline: Refer to above lumbar ROM table Goal status: INITIAL  5.  Patient will demonstrate improved BLE strength to >/= 5/5 for improved stability and ease of mobility. Baseline: Refer to above LE MMT table Goal status: INITIAL  6. Patient will report </= ***% on Modified Oswestry (MCID = 12%) to demonstrate improved functional ability with decreased pain interference. Baseline: *** Goal status: INITIAL  7.  Patient will tolerate *** min of (standing/sitting/walking) w/o increased pain to allow for *** improved mobility and activity tolerance. Baseline: *** Goal status: INITIAL  8.  Patient will report centralization of radicular symptoms.  Baseline: *** Goal status: {GOALSTATUS:25110}  9.  Patient will ***.  Baseline: *** Goal status: {GOALSTATUS:25110}   10.  *** Baseline: *** Goal status: {GOALSTATUS:25110}    PLAN:  PT FREQUENCY:  1-2x/week  PT DURATION: 8 weeks  PLANNED INTERVENTIONS: {rehab planned interventions:25118::97110-Therapeutic exercises,97530- Therapeutic [phone]- Neuromuscular re-education,97535- Self Rjmz,02859- Manual therapy,Patient/Family education}  PLAN FOR NEXT SESSION: PIERRETTE RED SENIOR, PT 04/13/2024, 3:36 PM

## 2024-04-14 ENCOUNTER — Ambulatory Visit: Attending: Family Medicine | Admitting: Rehabilitation

## 2024-05-01 ENCOUNTER — Ambulatory Visit

## 2024-05-03 ENCOUNTER — Emergency Department (HOSPITAL_BASED_OUTPATIENT_CLINIC_OR_DEPARTMENT_OTHER)
Admission: EM | Admit: 2024-05-03 | Discharge: 2024-05-03 | Disposition: A | Discharge status: Home / Self Care | Attending: Emergency Medicine | Admitting: Emergency Medicine

## 2024-05-03 ENCOUNTER — Other Ambulatory Visit: Payer: Self-pay

## 2024-05-03 ENCOUNTER — Encounter (HOSPITAL_BASED_OUTPATIENT_CLINIC_OR_DEPARTMENT_OTHER): Payer: Self-pay

## 2024-05-03 DIAGNOSIS — J029 Acute pharyngitis, unspecified: Secondary | ICD-10-CM

## 2024-05-03 DIAGNOSIS — R52 Pain, unspecified: Secondary | ICD-10-CM

## 2024-05-03 DIAGNOSIS — Z91040 Latex allergy status: Secondary | ICD-10-CM | POA: Insufficient documentation

## 2024-05-03 DIAGNOSIS — J069 Acute upper respiratory infection, unspecified: Secondary | ICD-10-CM | POA: Insufficient documentation

## 2024-05-03 LAB — RESP PANEL BY RT-PCR (RSV, FLU A&B, COVID)  RVPGX2
Influenza A by PCR: NEGATIVE
Influenza B by PCR: NEGATIVE
Resp Syncytial Virus by PCR: NEGATIVE
SARS Coronavirus 2 by RT PCR: NEGATIVE

## 2024-05-03 LAB — GROUP A STREP BY PCR: Group A Strep by PCR: NOT DETECTED

## 2024-05-03 MED ORDER — IBUPROFEN 400 MG PO TABS
400.0000 mg | ORAL_TABLET | Freq: Once | ORAL | Status: AC
  Administered 2024-05-03: 400 mg via ORAL
  Filled 2024-05-03: qty 1

## 2024-05-03 MED ORDER — ACETAMINOPHEN 325 MG PO TABS
650.0000 mg | ORAL_TABLET | Freq: Once | ORAL | Status: AC
  Administered 2024-05-03: 650 mg via ORAL
  Filled 2024-05-03: qty 2

## 2024-05-03 NOTE — ED Provider Notes (Signed)
 [place] EMERGENCY DEPARTMENT AT [facility] Provider Note   CSN: [id number] Arrival date & time: 05/03/24  1122     Patient presents with: Sore Throat   Tina [name] is a 38 y.o. female.   Pt c/o sore throat in past 1-2 days. +body aches. Subjective fever. Occasional non prod cough. No sinus drainage or pain. No severe headaches. No neck pain/stiffness. Child dx with hand foot mouth last week, but pt has no rash/lesions. No other known ill contacts. No trouble breathing or swallowing.   The history is provided by the patient and medical records.  Sore Throat Pertinent negatives include no chest pain, no abdominal pain, no headaches and no shortness of breath.       Prior to Admission medications   Medication Sig Start Date End Date Taking? Authorizing Provider  albuterol  (VENTOLIN  HFA) 108 (90 Base) MCG/ACT inhaler 2 puffs every 4-6 hours as needed for coughing or wheezing spells 12/28/23   Cheryl Reusing, FNP  cyclobenzaprine  (FLEXERIL ) 5 MG tablet Take 1 tablet (5 mg total) by mouth 3 (three) times daily as needed for muscle spasms. 05/23/23   Tawkaliyar, Roya, DO  EPINEPHrine  (EPIPEN  2-PAK) 0.3 mg/0.3 mL IJ SOAJ injection Use as directed for severe allergic reactions 12/28/23   Cheryl Reusing, FNP  erythromycin  ophthalmic ointment Place 1 Application into the right eye 3 (three) times daily. 03/03/24   Alvia Bring, DO  fluticasone  (FLONASE ) 50 MCG/ACT nasal spray Place 1 to 2 sprays in each nostril once a day as needed for stuffy nose 12/28/23   Cheryl Reusing, FNP  fluticasone  (FLOVENT  HFA) 110 MCG/ACT inhaler Inhale 2 puffs twice a day with spacer to help prevent cough and wheeze.  Rinse mouth out afterwards 12/28/23   Cheryl Reusing, FNP  lidocaine  (XYLOCAINE ) 2 % solution Use as directed 15 mLs in the mouth or throat as needed for mouth pain. Patient not taking: Reported on 03/27/2024 07/05/23   Veta Alan, PA-C  lubiprostone  (AMITIZA ) 24 MCG  capsule TAKE 1 CAPSULE (24 MCG TOTAL) BY MOUTH 2 (TWO) TIMES DAILY WITH A MEAL. Patient not taking: Reported on 03/27/2024 10/22/23   Alvia Bring, DO  meloxicam  (MOBIC ) 7.5 MG tablet Take 1 tab every 12 hours as needed. 06/20/23   Alvia Bring, DO  metaxalone  (SKELAXIN ) 800 MG tablet Take 1 tablet (800 mg total) by mouth 3 (three) times daily as needed for muscle spasms. 06/20/23   Alvia Bring, DO  methocarbamol  (ROBAXIN ) 500 MG tablet Take 1 tablet (500 mg total) by mouth 3 (three) times daily. 09/24/23   Willo Mini, NP  pantoprazole  (PROTONIX ) 40 MG tablet Take 1 tablet (40 mg total) by mouth daily. Patient not taking: Reported on 03/27/2024 09/24/23   Willo Mini, NP  Spacer/Aero-Holding Chambers DEVI 01 spacer 12/09/21   Cheryl Reusing, FNP    Allergies: Banana extract allergy  skin test, Banana, Cefazolin , Diphenhydramine , and Latex    Review of Systems  Constitutional:  Positive for fever.  HENT:  Positive for sore throat. Negative for rhinorrhea, sinus pain and trouble swallowing.   Eyes:  Negative for pain, discharge and redness.  Respiratory:  Positive for cough. Negative for shortness of breath.   Cardiovascular:  Negative for chest pain and leg swelling.  Gastrointestinal:  Negative for abdominal pain, diarrhea and vomiting.  Genitourinary:  Negative for dysuria and flank pain.  Musculoskeletal:  Positive for myalgias. Negative for back pain, neck pain and neck stiffness.  Skin:  Negative for rash.  Neurological:  Negative  for headaches.    Updated Vital Signs BP 123/81   Pulse 85   Temp 99.1 F (37.3 C) (Oral)   Resp 18   Ht 1.499 m (4' 11)   Wt 58.1 kg   LMP 05/02/2024 (Exact Date)   SpO2 100%   BMI 25.85 kg/m   Physical Exam Vitals and nursing note reviewed.  Constitutional:      Appearance: Normal appearance. She is well-developed.  HENT:     Head: Atraumatic.     Right Ear: Tympanic membrane, ear canal and external ear normal.     Left Ear: Tympanic  membrane, ear canal and external ear normal.     Nose: Nose normal.     Mouth/Throat:     Mouth: Mucous membranes are moist.     Pharynx: Oropharynx is clear.     Comments: Mild erythema. No asymmetric swelling or abscess noted. No trismus.  Eyes:     General: No scleral icterus.       Right eye: No discharge.        Left eye: No discharge.     Conjunctiva/sclera: Conjunctivae normal.     Pupils: Pupils are equal, round, and reactive to light.  Neck:     Trachea: No tracheal deviation.     Comments: Trachea midline. Thyroid not tenderness or swollen. No neck pain or masses. Cardiovascular:     Rate and Rhythm: Normal rate and regular rhythm.     Pulses: Normal pulses.     Heart sounds: Normal heart sounds. No murmur heard.    No friction rub. No gallop.  Pulmonary:     Effort: Pulmonary effort is normal. No respiratory distress.     Breath sounds: Normal breath sounds.  Abdominal:     General: Bowel sounds are normal. There is no distension.     Palpations: Abdomen is soft.     Tenderness: There is no abdominal tenderness.     Comments: No hsm.   Genitourinary:    Comments: No cva tenderness.  Musculoskeletal:        General: No swelling or tenderness.     Cervical back: Normal range of motion and neck supple. No rigidity. No muscular tenderness.     Right lower leg: No edema.     Left lower leg: No edema.  Lymphadenopathy:     Cervical: No cervical adenopathy.  Skin:    General: Skin is warm and dry.     Findings: No rash.  Neurological:     Mental Status: She is alert.     Comments: Alert, speech normal. Steady gait.   Psychiatric:        Mood and Affect: Mood normal.     (all labs ordered are listed, but only abnormal results are displayed) Results for orders placed or performed during the hospital encounter of 05/03/24  Group A Strep by PCR   Collection Time: 05/03/24 11:37 AM   Specimen: Throat; Sterile Swab  Result Value Ref Range   Group A Strep by PCR NOT  DETECTED NOT DETECTED  Resp panel by RT-PCR (RSV, Flu A&B, Covid) Throat   Collection Time: 05/03/24 11:37 AM   Specimen: Throat; Nasal Swab  Result Value Ref Range   SARS Coronavirus 2 by RT PCR NEGATIVE NEGATIVE   Influenza A by PCR NEGATIVE NEGATIVE   Influenza B by PCR NEGATIVE NEGATIVE   Resp Syncytial Virus by PCR NEGATIVE NEGATIVE   EKG: None  Radiology: No results found.   Procedures  Medications Ordered in the ED  acetaminophen  (TYLENOL ) tablet 650 mg (has no administration in time range)  ibuprofen  (ADVIL ) tablet 400 mg (has no administration in time range)                                    Medical Decision Making Problems Addressed: Body aches: acute illness or injury Sore throat: acute illness or injury Viral URI: acute illness or injury with systemic symptoms that poses a threat to life or bodily functions  Amount and/or Complexity of Data Reviewed Labs: ordered. Decision-making details documented in ED Course.  Risk OTC drugs. Prescription drug management.   Labs sent.   Reviewed nursing notes and prior charts for additional history.   Ibuprofen  po.  Labs reviewed/interpreted by me - strep neg. Covid and flu neg.   Symptoms/exams/labs felt most c/w viral syndrome.   Rec close pcp f/u.  Return precautions provided.        Final diagnoses:  Viral URI  Sore throat  Body aches    ED Discharge Orders     None          Bernard Drivers, MD 05/03/24 1323

## 2024-05-03 NOTE — ED Triage Notes (Signed)
 Onset Thursday of body aches and chills.  Sore throat with fever as high as 102.  Resp WNL

## 2024-05-03 NOTE — Discharge Instructions (Signed)
 It was our pleasure to provide your ER care today - we hope that you feel better.  Drink plenty of fluids/stay well hydrated. Take acetaminophen  or ibuprofen  as need for symptom relief.   Follow up  closely with primary care doctor in one week if symptoms fail to improve/resolve.  Return to ER if worse, new symptoms, new/severe pain, high fevers, trouble breathing, unable to swallow,  or other emergency concern.

## 2024-05-08 NOTE — Therapy (Incomplete)
 OUTPATIENT PHYSICAL THERAPY THORACOLUMBAR EVALUATION   Patient Name: Tina [name] MRN: [id number] DOB:11/16/1985, 38 y.o., female Today's Date: 05/08/2024  END OF SESSION:   Past Medical History:  Diagnosis Date   Allergy     Asthma    Heart murmur    Past Surgical History:  Procedure Laterality Date   BREAST EXCISIONAL BIOPSY Left 2007   CESAREAN SECTION N/A 10/20/2020   Procedure: CESAREAN SECTION;  Surgeon: Horacio Boas, MD;  Location: [facility] LD ORS;  Service: Obstetrics;  Laterality: N/A;  FHR Decelerations   COLPOSCOPY     LAPAROSCOPY     WISDOM TOOTH EXTRACTION     Patient Active Problem List   Diagnosis Date Noted   Hordeolum 03/03/2024   Conjunctivitis 03/03/2024   Left shoulder pain 02/01/2024   Viral URI 12/27/2023   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   Abdominal discomfort in flank 04/10/2023   Palpitations 12/21/2022   Temporal pain 08/28/2022   Left lateral abdominal pain 05/30/2022   Sinobronchitis 05/02/2022   Epistaxis 03/15/2022   Migraines 02/02/2022   CTS (carpal tunnel syndrome) 08/25/2020   Cardiac murmur 04/28/2020   Subcutaneous mass 12/23/2019   GERD (gastroesophageal reflux disease) 07/23/2019   Asthma exacerbation 02/19/2019   Well adult exam 02/04/2019   Seasonal and perennial allergic rhinitis 01/22/2019   Food allergy  01/22/2019   Mild persistent asthma 01/14/2019    PCP: Alvia Bring, DO   REFERRING PROVIDER: Alvia Bring, DO   REFERRING DIAG: M51.360 (ICD-10-CM) - Discogenic low back pain  THERAPY DIAG:  No diagnosis found.  RATIONALE FOR EVALUATION AND TREATMENT: Rehabilitation  ONSET DATE: 10/24  NEXT MD VISIT: ***   SUBJECTIVE:                                                                                                                                                                                                         SUBJECTIVE STATEMENT: 38 y/o patient referred to PT from PCP for discogenic back  pain.   She reports began having pain about a year ago.   Did some PT in January of this year at the [facility] location with good results.   From notes appears she did a lot of core strength/stabilization and lumbar flexion exercises.    PAIN: Are you having pain? Yes: NPRS scale: *** Pain location: *** Pain description: *** Aggravating factors: *** Relieving factors: ***  PERTINENT HISTORY:  Asthma, heart murmur, migraines, palpitations  PRECAUTIONS: {Therapy precautions:24002}  RED FLAGS: {PT Red Flags:29287}  WEIGHT BEARING RESTRICTIONS: {Yes ***/No:24003}  FALLS:  Has patient  fallen in last 6 months? {fallsyesno:27318}  LIVING ENVIRONMENT: Lives with: {[facility] lives with:25569::lives with their family} Lives in: {Lives in:25570} Stairs: {opstairs:27293} Has following equipment at home: {Assistive devices:23999}  OCCUPATION: ***  PLOF: {PLOF:24004}  PATIENT GOALS: ***   OBJECTIVE: (objective measures completed at initial evaluation unless otherwise dated)  DIAGNOSTIC FINDINGS:  05/28/23 - CT LUMBAR SPINE WITHOUT CONTRAST FINDINGS: Segmentation: Conventional numbering is assumed with 5 non-rib-bearing, lumbar type vertebral bodies.   Alignment: Normal.   Vertebrae: Normal vertebral body heights. No acute fracture or suspicious bone lesion.   Paraspinal and other soft tissues: Unremarkable.   Disc levels: Moderate degenerative changes of the right-greater-than-left sacroiliac joints.   T12-L1:  Normal.   L1-L2:  Normal.   L2-L3:  Normal.   L3-L4:  Normal.   L4-L5: Small disc bulge and mild bilateral facet arthropathy. No spinal canal stenosis or neural foraminal narrowing.   L5-S1: Disc bulge and left-greater-than-right facet arthropathy results in moderate left neural foraminal narrowing.   IMPRESSION: 1. No acute fracture or traumatic listhesis of the lumbar spine. 2. Disc bulge and left-greater-than-right facet arthropathy at L5-S1  results in moderate left neural foraminal narrowing. 3. Moderate degenerative changes of the right-greater-than-left sacroiliac joints.    PATIENT SURVEYS:  {rehab surveys:24030}  SCREENING FOR RED FLAGS: Bowel or bladder incontinence: {Yes/No:304960894} Spinal tumors: {Yes/No:304960894} Cauda equina syndrome: {Yes/No:304960894} Compression fracture: {Yes/No:304960894} Abdominal aneurysm: {Yes/No:304960894}  COGNITION:  Overall cognitive status: {cognition:24006}    SENSATION: {sensation:27233}  POSTURE:  {posture:25561}  PALPATION: ***  LUMBAR ROM:   {AROM/PROM:27142}  Eval  Flexion   Extension   Right lateral flexion   Left lateral flexion   Right rotation   Left rotation   (Blank rows = not tested)  MUSCLE LENGTH: Hamstrings: Right *** deg; Left *** deg Thomas test: Right *** deg; Left *** deg Hamstrings: *** ITB: *** Piriformis: *** Hip flexors: *** Quads: *** Heelcord: ***  LOWER EXTREMITY ROM:     {AROM/PROM:27142}  Right eval Left eval  Hip flexion    Hip extension    Hip abduction    Hip adduction    Hip internal rotation    Hip external rotation    Knee flexion    Knee extension    Ankle dorsiflexion    Ankle plantarflexion    Ankle inversion    Ankle eversion    (Blank rows = not tested)  LOWER EXTREMITY MMT:    MMT Right eval Left eval  Hip flexion    Hip extension    Hip abduction    Hip adduction    Hip internal rotation    Hip external rotation    Knee flexion    Knee extension    Ankle dorsiflexion    Ankle plantarflexion    Ankle inversion    Ankle eversion     (Blank rows = not tested)  LUMBAR SPECIAL TESTS:  {lumbar special test:25242}  FUNCTIONAL TESTS:  {Functional tests:24029}  GAIT: Distance walked: *** Assistive device utilized: {Assistive devices:23999} Level of assistance: {Levels of assistance:24026} Gait pattern: {gait characteristics:25376} Comments: ***   TODAY'S TREATMENT:   ***05/08/2024 -  Eval   PATIENT EDUCATION:  Education details: {Education details:27468}  Person educated: {Person educated:25204} Education method: {Education Method EU:67125} Education comprehension: {Education Comprehension:25206}  HOME EXERCISE PROGRAM: ***   ASSESSMENT:  CLINICAL IMPRESSION: [name] is a 38 y.o. female who was referred to physical therapy for evaluation and treatment for discogenic LBP.  Lumbar CT from 11/24 shows L5/S1 disc  bulging with L foraminal stenosis and B facet arthropathy.   Patient reports onset of *** pain beginning ***. Pain is worse with ***.  Patient has deficits in *** ROM, *** LE flexibility, *** strength, abnormal posture, and TTP with abnormal muscle tension *** which are interfering with ADLs and are impacting quality of life.  On Modified Oswestry patient scored ***/50 demonstrating ***% or *** disability.  Reanne will benefit from skilled PT to address above deficits to improve mobility and activity tolerance with decreased pain interference.   ***  OBJECTIVE IMPAIRMENTS: {opptimpairments:25111}.   ACTIVITY LIMITATIONS: {activitylimitations:27494}  PARTICIPATION LIMITATIONS: {participationrestrictions:25113}  PERSONAL FACTORS: Fitness, Time since onset of injury/illness/exacerbation, and 1-2 comorbidities: Asthma, heart murmur, migraines, palpitations are also affecting patient's functional outcome.   REHAB POTENTIAL: Good  CLINICAL DECISION MAKING: Evolving/moderate complexity  EVALUATION COMPLEXITY: Moderate   GOALS: Goals reviewed with patient? Yes  SHORT TERM GOALS: Target date: ***  Patient will be independent with initial HEP to improve outcomes and carryover.  Baseline: 100% PT assist required for correct completion Goal status: INITIAL  2.  Patient will report 25% improvement in low back pain to improve QOL. Baseline: *** Goal status: INITIAL    LONG TERM GOALS: Target date: ***  Patient will be independent with  ongoing/advanced HEP for self-management at home.  Baseline: no advanced HEP yet Goal status: INITIAL  2.  Patient will report 50-75% improvement in low back pain to improve QOL.  Baseline: *** Goal status: INITIAL  3.  Patient to demonstrate ability to achieve and maintain good spinal alignment/posturing and body mechanics needed for daily activities. Baseline: *** Goal status: INITIAL  4.  Patient will demonstrate full pain free lumbar ROM to perform ADLs.   Baseline: Refer to above lumbar ROM table Goal status: INITIAL  5.  Patient will demonstrate improved BLE strength to >/= 5/5 for improved stability and ease of mobility. Baseline: Refer to above LE MMT table Goal status: INITIAL  6. Patient will report </= ***% on Modified Oswestry (MCID = 12%) to demonstrate improved functional ability with decreased pain interference. Baseline: *** Goal status: INITIAL  7.  Patient will tolerate *** min of (standing/sitting/walking) w/o increased pain to allow for *** improved mobility and activity tolerance. Baseline: *** Goal status: INITIAL  8.  Patient will report centralization of radicular symptoms.  Baseline: *** Goal status: {GOALSTATUS:25110}  9.  Patient will ***.  Baseline: *** Goal status: {GOALSTATUS:25110}   10.  *** Baseline: *** Goal status: {GOALSTATUS:25110}    PLAN:  PT FREQUENCY: 1-2x/week  PT DURATION: 8 weeks  PLANNED INTERVENTIONS: {rehab planned interventions:25118::97110-Therapeutic exercises,97530- Therapeutic [phone]- Neuromuscular re-education,97535- Self Rjmz,02859- Manual therapy,Patient/Family education}  PLAN FOR NEXT SESSION: ***   Elijah CHRISTELLA Hidden, PT 05/08/2024, 7:01 PM

## 2024-05-12 ENCOUNTER — Ambulatory Visit: Attending: Family Medicine | Admitting: Physical Therapy

## 2024-06-04 ENCOUNTER — Ambulatory Visit: Payer: Self-pay

## 2024-06-04 ENCOUNTER — Ambulatory Visit: Admitting: Family Medicine

## 2024-06-04 NOTE — Telephone Encounter (Signed)
 FYI Only or Action Required?: FYI only for provider: appointment scheduled on 06/10/2024.  Patient was last seen in primary care on 03/27/2024 by Alvia Bring, DO.  Called Nurse Triage reporting Pain.  Symptoms began x several days.  Interventions attempted: Nothing.  Symptoms are: gradually worsening.  Triage Disposition: See PCP When Office is Open (Within 3 Days)  Patient/caregiver understands and will follow disposition?: Yes   Copied from CRM #[id number]. Topic: Clinical - Red Word Triage >> Jun 04, 2024  3:34 PM Rachelle R wrote: [facility] that prompted transfer to Nurse Triage: Pain under right jaw line that gets worse when I put my head down towards right shoulder blade. The pain is tender when I touch it and it hurts when I open my mouth. I noticed it last yesterday when I was washing my face. If I drag my thumb from underneath my ear almost reaching my chin it hurts but no tooth pain no gum pain. Have always had a small cyst under my chin but never bothered me.  Patient was scheduled today but missed her appointment. Reason for Disposition  [1] MILD-MODERATE mouth pain AND [2] present > 3 days  Answer Assessment - Initial Assessment Questions 1. ONSET: When did your mouth start hurting? (e.g., hours or days ago)      X couple days right side lower jaw (between and chin) 2. SEVERITY: How bad is the pain? (Scale 1-10; or mild, moderate or severe)     5/10 3. SORES: Are there any sores or ulcers in the mouth? If Yes, ask: What part of the mouth are the sores in?     na 4. FEVER: Do you have a fever? If Yes, ask: What is your temperature, how was it measured, and when did it start?     no 5. CAUSE: What do you think is causing the mouth pain?     unsure 6. OTHER SYMPTOMS: Do you have any other symptoms? (e.g., difficulty breathing)     no  Protocols used: Mouth Pain-A-AH

## 2024-06-10 ENCOUNTER — Ambulatory Visit: Admitting: Family Medicine

## 2024-07-07 ENCOUNTER — Other Ambulatory Visit: Payer: Self-pay

## 2024-07-07 DIAGNOSIS — R34 Anuria and oliguria: Secondary | ICD-10-CM | POA: Diagnosis not present

## 2024-07-07 DIAGNOSIS — R1031 Right lower quadrant pain: Secondary | ICD-10-CM | POA: Insufficient documentation

## 2024-07-07 DIAGNOSIS — D649 Anemia, unspecified: Secondary | ICD-10-CM | POA: Diagnosis not present

## 2024-07-07 DIAGNOSIS — J45909 Unspecified asthma, uncomplicated: Secondary | ICD-10-CM | POA: Insufficient documentation

## 2024-07-07 DIAGNOSIS — Z7951 Long term (current) use of inhaled steroids: Secondary | ICD-10-CM | POA: Diagnosis not present

## 2024-07-07 DIAGNOSIS — Z91040 Latex allergy status: Secondary | ICD-10-CM | POA: Diagnosis not present

## 2024-07-07 LAB — URINALYSIS, MICROSCOPIC (REFLEX): Bacteria, UA: NONE SEEN

## 2024-07-07 LAB — URINALYSIS, ROUTINE W REFLEX MICROSCOPIC
Bilirubin Urine: NEGATIVE
Glucose, UA: NEGATIVE mg/dL
Hgb urine dipstick: NEGATIVE
Ketones, ur: NEGATIVE mg/dL
Leukocytes,Ua: NEGATIVE
Nitrite: NEGATIVE
Protein, ur: 100 mg/dL — AB
Specific Gravity, Urine: >1.03 — ABNORMAL HIGH (ref 1.005–1.030)
pH: 5.5 (ref 5.0–8.0)

## 2024-07-07 LAB — CBC WITH DIFFERENTIAL/PLATELET
Abs Immature Granulocytes: 0.01 K/uL (ref 0.00–0.07)
Basophils Absolute: 0 K/uL (ref 0.0–0.1)
Basophils Relative: 0 %
Eosinophils Absolute: 0.1 K/uL (ref 0.0–0.5)
Eosinophils Relative: 1 %
HCT: 31 % — ABNORMAL LOW (ref 36.0–46.0)
Hemoglobin: 10.4 g/dL — ABNORMAL LOW (ref 12.0–15.0)
Immature Granulocytes: 0 %
Lymphocytes Relative: 21 %
Lymphs Abs: 1 K/uL (ref 0.7–4.0)
MCH: 30.3 pg (ref 26.0–34.0)
MCHC: 33.5 g/dL (ref 30.0–36.0)
MCV: 90.4 fL (ref 80.0–100.0)
Monocytes Absolute: 0.5 K/uL (ref 0.1–1.0)
Monocytes Relative: 12 %
Neutro Abs: 3 K/uL (ref 1.7–7.7)
Neutrophils Relative %: 66 %
Platelets: 231 K/uL (ref 150–400)
RBC: 3.43 MIL/uL — ABNORMAL LOW (ref 3.87–5.11)
RDW: 12.8 % (ref 11.5–15.5)
WBC: 4.6 K/uL (ref 4.0–10.5)
nRBC: 0 % (ref 0.0–0.2)

## 2024-07-07 LAB — CK: Total CK: 42 U/L (ref 38–234)

## 2024-07-07 LAB — COMPREHENSIVE METABOLIC PANEL WITH GFR
ALT: 10 U/L (ref 0–44)
AST: 18 U/L (ref 15–41)
Albumin: 4.2 g/dL (ref 3.5–5.0)
Alkaline Phosphatase: 36 U/L — ABNORMAL LOW (ref 38–126)
Anion gap: 10 (ref 5–15)
BUN: 15 mg/dL (ref 6–20)
CO2: 25 mmol/L (ref 22–32)
Calcium: 9.4 mg/dL (ref 8.9–10.3)
Chloride: 102 mmol/L (ref 98–111)
Creatinine, Ser: 0.74 mg/dL (ref 0.44–1.00)
GFR, Estimated: >60 mL/min
Glucose, Bld: 96 mg/dL (ref 70–99)
Potassium: 4.1 mmol/L (ref 3.5–5.1)
Sodium: 137 mmol/L (ref 135–145)
Total Bilirubin: 0.3 mg/dL (ref 0.0–1.2)
Total Protein: 7.3 g/dL (ref 6.5–8.1)

## 2024-07-07 NOTE — ED Provider Triage Note (Signed)
 Emergency Medicine Provider Triage Evaluation Note  Tina [name] , a 38 y.o. female  was evaluated in triage.  Pt complains of body aches and decreased urine. Hx of asthma and mild cough but denies SOB. Endorses some nausea and some diarrhea yesterday. No medications for symptoms.   Review of Systems  Positive:  Negative:   Physical Exam  There were no vitals taken for this visit. Gen:   Awake, no distress   Resp:  Normal effort  MSK:   Moves extremities without difficulty  Other:    Medical Decision Making  Medically screening exam initiated at 7:04 PM.  Appropriate orders placed.  Tina [name] was informed that the remainder of the evaluation will be completed by another provider, this initial triage assessment does not replace that evaluation, and the importance of remaining in the ED until their evaluation is complete.     Hoy Nidia FALCON, [place] 07/07/24 1905

## 2024-07-07 NOTE — ED Triage Notes (Signed)
 Body aches. Several days of urinating only once a day. Does not feel full distended.

## 2024-07-08 ENCOUNTER — Emergency Department (HOSPITAL_BASED_OUTPATIENT_CLINIC_OR_DEPARTMENT_OTHER)
Admission: EM | Admit: 2024-07-08 | Discharge: 2024-07-08 | Disposition: A | Discharge status: Home / Self Care | Attending: Emergency Medicine | Admitting: Emergency Medicine

## 2024-07-08 ENCOUNTER — Emergency Department (HOSPITAL_BASED_OUTPATIENT_CLINIC_OR_DEPARTMENT_OTHER)

## 2024-07-08 DIAGNOSIS — B349 Viral infection, unspecified: Secondary | ICD-10-CM

## 2024-07-08 DIAGNOSIS — R34 Anuria and oliguria: Secondary | ICD-10-CM

## 2024-07-08 LAB — PREGNANCY, URINE: Preg Test, Ur: NEGATIVE

## 2024-07-08 MED ORDER — FENTANYL CITRATE (PF) 50 MCG/ML IJ SOSY
50.0000 ug | PREFILLED_SYRINGE | Freq: Once | INTRAMUSCULAR | Status: AC
  Administered 2024-07-08: 50 ug via INTRAVENOUS
  Filled 2024-07-08: qty 1

## 2024-07-08 MED ORDER — IOHEXOL 300 MG/ML  SOLN
100.0000 mL | Freq: Once | INTRAMUSCULAR | Status: AC | PRN
  Administered 2024-07-08: 75 mL via INTRAVENOUS

## 2024-07-08 MED ORDER — SODIUM CHLORIDE 0.9 % IV BOLUS
1000.0000 mL | Freq: Once | INTRAVENOUS | Status: AC
  Administered 2024-07-08: 1000 mL via INTRAVENOUS

## 2024-07-08 MED ORDER — ONDANSETRON 4 MG PO TBDP: 4.0000 mg | ORAL_TABLET | Freq: Three times a day (TID) | ORAL | 0 refills | Status: AC | PRN

## 2024-07-08 MED ORDER — ONDANSETRON HCL 4 MG/2ML IJ SOLN
4.0000 mg | Freq: Once | INTRAMUSCULAR | Status: AC
  Administered 2024-07-08: 4 mg via INTRAVENOUS
  Filled 2024-07-08: qty 2

## 2024-07-08 NOTE — Discharge Instructions (Addendum)
 Your laboratory evaluation and CT imaging was overall reassuring.  Continue to orally rehydrate.  Zofran  has been prescribed for nausea.  Follow-up with your PCP to ensure resolution.

## 2024-07-08 NOTE — ED Provider Notes (Signed)
 " [facility] AT [facility] Provider Note   CSN: [id number] Arrival date & time: 07/07/24  1841     Patient presents with: Oliguria   Tina [name] is a 38 y.o. female.   HPI   38 year old female with medical history significant for asthma, presenting to the emergency department with bodyaches for the past few days, right lower quadrant abdominal pain, nausea, decreased urine output, decreased p.o. intake.  She denies any sore throat, endorses a persistent cough but has a history of asthma.  No known sick contacts.  No fevers, endorses chills and bodyaches.  She denies any dysuria or frequency.  She denies any abnormal vaginal discharge or bleeding.  Pain is located in the right lower quadrant without radiation.  Prior to Admission medications  Medication Sig Start Date End Date Taking? Authorizing Provider  albuterol  (VENTOLIN  HFA) 108 (90 Base) MCG/ACT inhaler 2 puffs every 4-6 hours as needed for coughing or wheezing spells 12/28/23   Cheryl Reusing, FNP  cyclobenzaprine  (FLEXERIL ) 5 MG tablet Take 1 tablet (5 mg total) by mouth 3 (three) times daily as needed for muscle spasms. 05/23/23   Tawkaliyar, Roya, DO  EPINEPHrine  (EPIPEN  2-PAK) 0.3 mg/0.3 mL IJ SOAJ injection Use as directed for severe allergic reactions 12/28/23   Cheryl Reusing, FNP  erythromycin  ophthalmic ointment Place 1 Application into the right eye 3 (three) times daily. 03/03/24   Alvia Bring, DO  fluticasone  (FLONASE ) 50 MCG/ACT nasal spray Place 1 to 2 sprays in each nostril once a day as needed for stuffy nose 12/28/23   Cheryl Reusing, FNP  fluticasone  (FLOVENT  HFA) 110 MCG/ACT inhaler Inhale 2 puffs twice a day with spacer to help prevent cough and wheeze.  Rinse mouth out afterwards 12/28/23   Cheryl Reusing, FNP  lidocaine  (XYLOCAINE ) 2 % solution Use as directed 15 mLs in the mouth or throat as needed for mouth pain. Patient not taking: Reported on 03/27/2024 07/05/23    Veta Alan, PA-C  lubiprostone  (AMITIZA ) 24 MCG capsule TAKE 1 CAPSULE (24 MCG TOTAL) BY MOUTH 2 (TWO) TIMES DAILY WITH A MEAL. Patient not taking: Reported on 03/27/2024 10/22/23   Alvia Bring, DO  meloxicam  (MOBIC ) 7.5 MG tablet Take 1 tab every 12 hours as needed. 06/20/23   Alvia Bring, DO  metaxalone  (SKELAXIN ) 800 MG tablet Take 1 tablet (800 mg total) by mouth 3 (three) times daily as needed for muscle spasms. 06/20/23   Alvia Bring, DO  methocarbamol  (ROBAXIN ) 500 MG tablet Take 1 tablet (500 mg total) by mouth 3 (three) times daily. 09/24/23   Willo Mini, NP  pantoprazole  (PROTONIX ) 40 MG tablet Take 1 tablet (40 mg total) by mouth daily. Patient not taking: Reported on 03/27/2024 09/24/23   Willo Mini, NP  Spacer/Aero-Holding Chambers DEVI 01 spacer 12/09/21   Cheryl Reusing, FNP    Allergies: Banana extract allergy  skin test, Banana, Cefazolin , Diphenhydramine , and Latex    Review of Systems  All other systems reviewed and are negative.   Updated Vital Signs BP 130/78 (BP Location: Right Arm)   Pulse 97   Temp 97.8 F (36.6 C)   Resp 18   SpO2 100%   Physical Exam Vitals and nursing note reviewed.  Constitutional:      General: She is not in acute distress.    Appearance: She is well-developed.  HENT:     Head: Normocephalic and atraumatic.  Eyes:     Conjunctiva/sclera: Conjunctivae normal.  Cardiovascular:     Rate  and Rhythm: Normal rate and regular rhythm.     Heart sounds: No murmur heard. Pulmonary:     Effort: Pulmonary effort is normal. No respiratory distress.     Breath sounds: Normal breath sounds.  Abdominal:     Palpations: Abdomen is soft.     Tenderness: There is abdominal tenderness in the right lower quadrant. There is no guarding or rebound.  Musculoskeletal:        General: No swelling.     Cervical back: Neck supple.  Skin:    General: Skin is warm and dry.     Capillary Refill: Capillary refill takes less than 2 seconds.   Neurological:     Mental Status: She is alert.  Psychiatric:        Mood and Affect: Mood normal.     (all labs ordered are listed, but only abnormal results are displayed) Labs Reviewed  COMPREHENSIVE METABOLIC PANEL WITH GFR - Abnormal; Notable for the following components:      Result Value   Alkaline Phosphatase 36 (*)    All other components within normal limits  CBC WITH DIFFERENTIAL/PLATELET - Abnormal; Notable for the following components:   RBC 3.43 (*)    Hemoglobin 10.4 (*)    HCT 31.0 (*)    All other components within normal limits  URINALYSIS, ROUTINE W REFLEX MICROSCOPIC - Abnormal; Notable for the following components:   Specific Gravity, Urine >1.030 (*)    Protein, ur 100 (*)    All other components within normal limits  CK  URINALYSIS, MICROSCOPIC (REFLEX)    EKG: None  Radiology: No results found.   Procedures   Medications Ordered in the ED - No data to display                                  Medical Decision Making Amount and/or Complexity of Data Reviewed Labs: ordered. Radiology: ordered.  Risk Prescription drug management.    38 year old female with medical history significant for asthma, presenting to the emergency department with bodyaches for the past few days, right lower quadrant abdominal pain, nausea, decreased urine output, decreased p.o. intake.  She denies any sore throat, endorses a persistent cough but has a history of asthma.  No known sick contacts.  No fevers, endorses chills and bodyaches.  She denies any dysuria or frequency.  She denies any abnormal vaginal discharge or bleeding.  Pain is located in the right lower quadrant without radiation.  On arrival, the patient was afebrile, not tachycardic or tachypneic, hemodynamically stable, saturating well on room air.  On exam the patient had mild right lower quadrant tenderness to palpation.  She is not concerned for STI, denies any vaginal bleeding or vaginal discharge, no  dysuria or frequency.  Considered appendicitis, considered viral syndrome.  Considered other acute intra-abdominal abnormality but feel less likely.  Considered dehydration as the patient endorses decreased urine output.  A bladder scan revealed 30 cc in the bladder.  IV access obtained and the patient was administered an IV fluid bolus, IV fentanyl  50 mcg and IV Zofran .  Labs: Urinalysis negative for UTI, urine pregnancy negative, CK normal, CBC without a leukocytosis, mild anemia 10.4, CMP generally unremarkable.  CT abdomen pelvis: IMPRESSION:  1. No acute findings in the abdomen or pelvis.    Suspect mild viral syndrome resulting in decreased p.o. intake and mild dehydration.  Patient overall feeling symptomatically improved following the  above interventions, recommended continued oral rehydration outpatient, afebrile and vitally stable, will not test for COVID and flu in the setting of patient well appearance, explained to the patient that we have limited flu tests available at this facility and they are being reserved for patients being admitted to the hospital or with significant comorbidities.  Patient via symptomatic management with oral rehydration, Tylenol  and ibuprofen  for muscle aches, overall reassuring laboratory evaluation, exam, vitals and CT imaging at this time.  Stable for discharge and continued outpatient follow-up.     Final diagnoses:  None    ED Discharge Orders     None          Jerrol Agent, MD 07/08/24 1957  "

## 2024-07-09 ENCOUNTER — Emergency Department (HOSPITAL_COMMUNITY)
Admission: EM | Admit: 2024-07-09 | Discharge: 2024-07-09 | Disposition: A | Discharge status: Home / Self Care | Attending: Emergency Medicine | Admitting: Emergency Medicine

## 2024-07-09 ENCOUNTER — Other Ambulatory Visit: Payer: Self-pay

## 2024-07-09 DIAGNOSIS — Z91040 Latex allergy status: Secondary | ICD-10-CM | POA: Diagnosis not present

## 2024-07-09 DIAGNOSIS — T7840XA Allergy, unspecified, initial encounter: Secondary | ICD-10-CM | POA: Insufficient documentation

## 2024-07-09 DIAGNOSIS — R21 Rash and other nonspecific skin eruption: Secondary | ICD-10-CM | POA: Diagnosis present

## 2024-07-09 MED ORDER — LIDOCAINE HCL (PF) 1 % IJ SOLN
INTRAMUSCULAR | Status: AC
  Filled 2024-07-09: qty 5

## 2024-07-09 MED ORDER — LORATADINE 10 MG PO TABS
10.0000 mg | ORAL_TABLET | Freq: Every day | ORAL | Status: DC
  Administered 2024-07-09: 10 mg via ORAL
  Filled 2024-07-09: qty 1

## 2024-07-09 MED ORDER — PREDNISONE 50 MG PO TABS: ORAL_TABLET | ORAL | 0 refills | Status: AC

## 2024-07-09 MED ORDER — METHYLPREDNISOLONE SODIUM SUCC 125 MG IJ SOLR
125.0000 mg | Freq: Once | INTRAMUSCULAR | Status: AC
  Administered 2024-07-09: 125 mg via INTRAVENOUS
  Filled 2024-07-09: qty 2

## 2024-07-09 MED ORDER — FAMOTIDINE IN NACL 20-0.9 MG/50ML-% IV SOLN
20.0000 mg | Freq: Once | INTRAVENOUS | Status: AC
  Administered 2024-07-09: 20 mg via INTRAVENOUS
  Filled 2024-07-09: qty 50

## 2024-07-09 NOTE — ED Notes (Signed)
 D/C  Saline Lock,

## 2024-07-09 NOTE — Discharge Instructions (Signed)
 Take claritin  daily for the next 3 days

## 2024-07-09 NOTE — ED Triage Notes (Signed)
 Started itching yesterday and work up with a rash all over.  No sob or throat tightness. Pt reports right pelvic pain since Monday and right arm pain from carpal tunnel.

## 2024-07-09 NOTE — ED Provider Notes (Signed)
 "  EMERGENCY DEPARTMENT AT [facility] Provider Note   CSN: [id number] Arrival date & time: 07/09/24  9081     Patient presents with: Rash   Tina [name] is a 38 y.o. female.  {Add pertinent medical, surgical, social history, OB history to YEP:67052} Patient complains of a full-body rash.  Patient reports that she was seen at [facility] yesterday for vomiting.  Patient was given Zofran  and she had a CT scan of her abdomen with contrast.  Patient complains of an itchy rash today involving her arms and upper body no involvement of her legs.Patient denies any shortness of breath.  Patient denies any fever or chills she is not currently having any nausea or vomiting she denies any chest pain patient has not had any throat swelling or any difficulty swallowing.  The history is provided by the patient.  Rash      Prior to Admission medications  Medication Sig Start Date End Date Taking? Authorizing Provider  albuterol  (VENTOLIN  HFA) 108 (90 Base) MCG/ACT inhaler 2 puffs every 4-6 hours as needed for coughing or wheezing spells 12/28/23   Cheryl Reusing, FNP  cyclobenzaprine  (FLEXERIL ) 5 MG tablet Take 1 tablet (5 mg total) by mouth 3 (three) times daily as needed for muscle spasms. 05/23/23   Tawkaliyar, Roya, DO  EPINEPHrine  (EPIPEN  2-PAK) 0.3 mg/0.3 mL IJ SOAJ injection Use as directed for severe allergic reactions 12/28/23   Cheryl Reusing, FNP  erythromycin  ophthalmic ointment Place 1 Application into the right eye 3 (three) times daily. 03/03/24   Alvia Bring, DO  fluticasone  (FLONASE ) 50 MCG/ACT nasal spray Place 1 to 2 sprays in each nostril once a day as needed for stuffy nose 12/28/23   Cheryl Reusing, FNP  fluticasone  (FLOVENT  HFA) 110 MCG/ACT inhaler Inhale 2 puffs twice a day with spacer to help prevent cough and wheeze.  Rinse mouth out afterwards 12/28/23   Cheryl Reusing, FNP  lidocaine  (XYLOCAINE ) 2 % solution Use as directed 15 mLs in the mouth or  throat as needed for mouth pain. Patient not taking: Reported on 03/27/2024 07/05/23   Veta Alan, PA-C  lubiprostone  (AMITIZA ) 24 MCG capsule TAKE 1 CAPSULE (24 MCG TOTAL) BY MOUTH 2 (TWO) TIMES DAILY WITH A MEAL. Patient not taking: Reported on 03/27/2024 10/22/23   Alvia Bring, DO  meloxicam  (MOBIC ) 7.5 MG tablet Take 1 tab every 12 hours as needed. 06/20/23   Alvia Bring, DO  metaxalone  (SKELAXIN ) 800 MG tablet Take 1 tablet (800 mg total) by mouth 3 (three) times daily as needed for muscle spasms. 06/20/23   Alvia Bring, DO  methocarbamol  (ROBAXIN ) 500 MG tablet Take 1 tablet (500 mg total) by mouth 3 (three) times daily. 09/24/23   Willo Mini, NP  ondansetron  (ZOFRAN -ODT) 4 MG disintegrating tablet Take 1 tablet (4 mg total) by mouth every 8 (eight) hours as needed. 07/08/24   Jerrol Agent, MD  pantoprazole  (PROTONIX ) 40 MG tablet Take 1 tablet (40 mg total) by mouth daily. Patient not taking: Reported on 03/27/2024 09/24/23   Willo Mini, NP  Spacer/Aero-Holding Chambers DEVI 01 spacer 12/09/21   Cheryl Reusing, FNP    Allergies: Banana extract allergy  skin test, Banana, Cefazolin , Diphenhydramine , and Latex    Review of Systems  Skin:  Positive for rash.  All other systems reviewed and are negative.   Updated Vital Signs BP 108/65   Pulse 91   Temp 98.3 F (36.8 C)   Resp 16   Ht 4' 11 (1.499 m)  Wt 56.7 kg   LMP 06/25/2024   SpO2 100%   BMI 25.25 kg/m   Physical Exam Vitals and nursing note reviewed.  Constitutional:      Appearance: She is well-developed.  HENT:     Head: Normocephalic.  Cardiovascular:     Rate and Rhythm: Normal rate.  Pulmonary:     Effort: Pulmonary effort is normal.  Abdominal:     General: There is no distension.  Musculoskeletal:        General: Normal range of motion.     Cervical back: Normal range of motion.  Skin:    Findings: Rash present.     Comments: Red raised areas chest, arms and back   Neurological:      General: No focal deficit present.     Mental Status: She is alert and oriented to person, place, and time.     (all labs ordered are listed, but only abnormal results are displayed) Labs Reviewed - No data to display  EKG: None  Radiology: CT ABDOMEN PELVIS W CONTRAST Result Date: 07/08/2024 EXAM: CT ABDOMEN AND PELVIS WITH CONTRAST 07/08/2024 02:37:04 AM TECHNIQUE: CT of the abdomen and pelvis was performed with the administration of 75 mL of iohexol  (OMNIPAQUE ) 300 MG/ML solution. Multiplanar reformatted images are provided for review. Automated exposure control, iterative reconstruction, and/or weight-based adjustment of the mA/kV was utilized to reduce the radiation dose to as low as reasonably achievable. COMPARISON: 12/21/2020 CLINICAL HISTORY: RLQ abdominal pain. FINDINGS: LOWER CHEST: No acute abnormality. LIVER: The liver is unremarkable. GALLBLADDER AND BILE DUCTS: Gallbladder is unremarkable. No biliary ductal dilatation. SPLEEN: No acute abnormality. PANCREAS: No acute abnormality. ADRENAL GLANDS: No acute abnormality. KIDNEYS, URETERS AND BLADDER: No stones in the kidneys or ureters. No hydronephrosis. No perinephric or periureteral stranding. Urinary bladder is unremarkable. GI AND BOWEL: Stomach demonstrates no acute abnormality. There is no bowel obstruction. PERITONEUM AND RETROPERITONEUM: No ascites. No free air. VASCULATURE: Aorta is normal in caliber. LYMPH NODES: No lymphadenopathy. REPRODUCTIVE ORGANS: No acute abnormality. BONES AND SOFT TISSUES: No acute osseous abnormality. No focal soft tissue abnormality. IMPRESSION: 1. No acute findings in the abdomen or pelvis. Electronically signed by: Franky Stanford MD 07/08/2024 03:53 AM EST RP Workstation: [id number]    {Document cardiac monitor, telemetry assessment procedure when appropriate:32947} Procedures   Medications Ordered in the ED  methylPREDNISolone  sodium succinate  (SOLU-MEDROL ) 125 mg/2 mL injection 125 mg (has no  administration in time range)  famotidine  (PEPCID ) IVPB 20 mg premix (has no administration in time range)  loratadine  (CLARITIN ) tablet 10 mg (has no administration in time range)      {Click here for ABCD2, HEART and other calculators REFRESH Note before signing:1}                              Medical Decision Making Risk OTC drugs. Prescription drug management.   ***  {Document critical care time when appropriate  Document review of labs and clinical decision tools ie CHADS2VASC2, etc  Document your independent review of radiology images and any outside records  Document your discussion with family members, caretakers and with consultants  Document social determinants of health affecting pt's care  Document your decision making why or why not admission, treatments were needed:32947:::1}   Final diagnoses:  None    ED Discharge Orders     None        "

## 2024-07-11 ENCOUNTER — Ambulatory Visit: Payer: Self-pay

## 2024-07-11 NOTE — Telephone Encounter (Signed)
 FYI Only or Action Required?: FYI only for provider: appointment scheduled on 07/16/24.  Patient was last seen in primary care on 03/27/2024 by Alvia Bring, DO.  Called Nurse Triage reporting Abdominal Pain.  Symptoms began yesterday.  Interventions attempted: Prescription medications: Pepcid , prednsione.  Symptoms are: gradually worsening.  Triage Disposition: See HCP Within 4 Hours (Or PCP Triage)  Patient/caregiver understands and will follow disposition?: No, refuses disposition    12/30 went to ED for pelvic pain and RLQ pain. Noted to have hx of IBS. CTAP negative. Dx with mild viral syndrome and discharged home. Symptoms improved after getting 2 bags of IV fluids.    On 12/31 went ED for allergic reaction to bananas with full body rash and prescribed prednisone  and told to take claritin .  Pt calling today to report new onset upper abdominal pain starting last night, ranges between 5-7/10. Constant, improves with walking. No CP, SOB or fever. Has some mild bloating. Top of abdomen feels hard. Had 4 BM's yesterday, diarrhea. No blood in stool. Denies n/v. Advised to be seen in next 4 hours. Pt declines. States she already has a f/u appt with PCP on Wednesday of next week and would like to treat at home for now and would seek UC or ED if symptoms got worse. Advised pt could try tums or pepto bismol in the meantime.    Copied from CRM [id number]. Topic: Clinical - Red Word Triage >> Jul 11, 2024  2:03 PM Selinda RAMAN wrote: Red Word that prompted transfer to Nurse Triage: The patient called in stating she had an Urgent Care and ER visit last week due to Abdominal issues. She was prescribed predniSONE  (DELTASONE ) 50 MG tablet for a short term 3 day dose but says her stomach is bothering her even worse. She does have a hospital follow up next Wednesday but would like to speak with a nurse to see if there is anything she can do to help before then. I will transfer her to E2C2 NT Reason for  Disposition  [1] MILD-MODERATE pain AND [2] constant AND [3] present > 2 hours  Answer Assessment - Initial Assessment Questions 1. LOCATION: Where does it hurt?      Upper abdomen  2. RADIATION: Does the pain shoot anywhere else? (e.g., chest, back)     Denies  3. ONSET: When did the pain begin? (e.g., minutes, hours or days ago)      Last night  4. SUDDEN: Gradual or sudden onset?     Sudden after eating  5. PATTERN Does the pain come and go, or is it constant?     Constant but fluctuates  6. SEVERITY: How bad is the pain?  (e.g., Scale 1-10; mild, moderate, or severe)     Ranges between 5-7/10  7. RECURRENT SYMPTOM: Have you ever had this type of stomach pain before? If Yes, ask: When was the last time? and What happened that time?      Can't remember  8. AGGRAVATING FACTORS: Does anything seem to cause this pain? (e.g., foods, stress, alcohol)     Walking or laying a certain way makes it better  9. CARDIAC SYMPTOMS: Do you have any of the following symptoms: chest pain, difficulty breathing, sweating, nausea?     Denies  10. OTHER SYMPTOMS: Do you have any other symptoms? (e.g., back pain, diarrhea, fever, urination pain, vomiting)       Diarrhea yesterday  11. PREGNANCY: Is there any chance you are pregnant? When was your  last menstrual period?       Denies, negative pregnancy test in the ED  Protocols used: Abdominal Pain - Upper-A-AH

## 2024-07-13 ENCOUNTER — Other Ambulatory Visit: Payer: Self-pay

## 2024-07-13 ENCOUNTER — Emergency Department (HOSPITAL_COMMUNITY)

## 2024-07-13 ENCOUNTER — Encounter (HOSPITAL_COMMUNITY): Payer: Self-pay

## 2024-07-13 ENCOUNTER — Ambulatory Visit (HOSPITAL_COMMUNITY)
Admission: EM | Admit: 2024-07-13 | Discharge: 2024-07-14 | Disposition: A | Discharge status: Home / Self Care | Attending: Emergency Medicine | Admitting: Emergency Medicine

## 2024-07-13 DIAGNOSIS — K589 Irritable bowel syndrome without diarrhea: Secondary | ICD-10-CM | POA: Insufficient documentation

## 2024-07-13 DIAGNOSIS — D649 Anemia, unspecified: Secondary | ICD-10-CM | POA: Insufficient documentation

## 2024-07-13 DIAGNOSIS — J45909 Unspecified asthma, uncomplicated: Secondary | ICD-10-CM | POA: Insufficient documentation

## 2024-07-13 DIAGNOSIS — K810 Acute cholecystitis: Secondary | ICD-10-CM

## 2024-07-13 DIAGNOSIS — R7989 Other specified abnormal findings of blood chemistry: Secondary | ICD-10-CM

## 2024-07-13 DIAGNOSIS — K812 Acute cholecystitis with chronic cholecystitis: Secondary | ICD-10-CM | POA: Diagnosis not present

## 2024-07-13 DIAGNOSIS — K828 Other specified diseases of gallbladder: Secondary | ICD-10-CM

## 2024-07-13 LAB — HCG, SERUM, QUALITATIVE: Preg, Serum: NEGATIVE

## 2024-07-13 LAB — COMPREHENSIVE METABOLIC PANEL WITH GFR
ALT: 231 U/L — ABNORMAL HIGH (ref 0–44)
AST: 61 U/L — ABNORMAL HIGH (ref 15–41)
Albumin: 3.7 g/dL (ref 3.5–5.0)
Alkaline Phosphatase: 66 U/L (ref 38–126)
Anion gap: 9 (ref 5–15)
BUN: 14 mg/dL (ref 6–20)
CO2: 28 mmol/L (ref 22–32)
Calcium: 9.6 mg/dL (ref 8.9–10.3)
Chloride: 106 mmol/L (ref 98–111)
Creatinine, Ser: 0.84 mg/dL (ref 0.44–1.00)
GFR, Estimated: >60 mL/min
Glucose, Bld: 98 mg/dL (ref 70–99)
Potassium: 3.2 mmol/L — ABNORMAL LOW (ref 3.5–5.1)
Sodium: 144 mmol/L (ref 135–145)
Total Bilirubin: <0.2 mg/dL (ref 0.0–1.2)
Total Protein: 6.4 g/dL — ABNORMAL LOW (ref 6.5–8.1)

## 2024-07-13 LAB — URINALYSIS, ROUTINE W REFLEX MICROSCOPIC
Bilirubin Urine: NEGATIVE
Glucose, UA: NEGATIVE mg/dL
Hgb urine dipstick: NEGATIVE
Ketones, ur: NEGATIVE mg/dL
Leukocytes,Ua: NEGATIVE
Nitrite: NEGATIVE
Protein, ur: 30 mg/dL — AB
Specific Gravity, Urine: 1.034 — ABNORMAL HIGH (ref 1.005–1.030)
pH: 5 (ref 5.0–8.0)

## 2024-07-13 LAB — CBC
HCT: 26.8 % — ABNORMAL LOW (ref 36.0–46.0)
Hemoglobin: 8.8 g/dL — ABNORMAL LOW (ref 12.0–15.0)
MCH: 29.8 pg (ref 26.0–34.0)
MCHC: 32.8 g/dL (ref 30.0–36.0)
MCV: 90.8 fL (ref 80.0–100.0)
Platelets: 359 K/uL (ref 150–400)
RBC: 2.95 MIL/uL — ABNORMAL LOW (ref 3.87–5.11)
RDW: 12.9 % (ref 11.5–15.5)
WBC: 7.9 K/uL (ref 4.0–10.5)
nRBC: 0.3 % — ABNORMAL HIGH (ref 0.0–0.2)

## 2024-07-13 LAB — POC OCCULT BLOOD, ED: Fecal Occult Bld: NEGATIVE

## 2024-07-13 LAB — LIPASE, BLOOD: Lipase: 37 U/L (ref 11–51)

## 2024-07-13 MED ORDER — PANTOPRAZOLE SODIUM 40 MG IV SOLR
40.0000 mg | Freq: Once | INTRAVENOUS | Status: AC
  Administered 2024-07-13: 40 mg via INTRAVENOUS
  Filled 2024-07-13: qty 10

## 2024-07-13 MED ORDER — IOHEXOL 300 MG/ML  SOLN
100.0000 mL | Freq: Once | INTRAMUSCULAR | Status: AC | PRN
  Administered 2024-07-13: 100 mL via INTRAVENOUS

## 2024-07-13 MED ORDER — POTASSIUM CHLORIDE CRYS ER 20 MEQ PO TBCR
40.0000 meq | EXTENDED_RELEASE_TABLET | Freq: Once | ORAL | Status: AC
  Administered 2024-07-13: 40 meq via ORAL
  Filled 2024-07-13: qty 2

## 2024-07-13 MED ORDER — MORPHINE SULFATE (PF) 4 MG/ML IV SOLN
4.0000 mg | Freq: Once | INTRAVENOUS | Status: AC
  Administered 2024-07-13: 4 mg via INTRAVENOUS
  Filled 2024-07-13: qty 1

## 2024-07-13 MED ORDER — METOCLOPRAMIDE HCL 5 MG/ML IJ SOLN
10.0000 mg | Freq: Once | INTRAMUSCULAR | Status: AC
  Administered 2024-07-13: 10 mg via INTRAVENOUS
  Filled 2024-07-13: qty 2

## 2024-07-13 NOTE — ED Triage Notes (Signed)
 Pt has had sharp epigastric pain with tenderness with palpation. She also states that she has noted distention. Pt has IBS and was seen in ED earlier this week for abdominal pain, as well as, another visit for an allergic reaction. Last BM was 2 days ago bit pt states this is not abnormal for her. She does have nausea, but denies vomiting/diarrhea.

## 2024-07-13 NOTE — ED Provider Notes (Signed)
 "  EMERGENCY DEPARTMENT AT [facility] Provider Note   CSN: [id number] Arrival date & time: 07/13/24  2024     Patient presents with: Abdominal Pain   Pansey Pinheiro is a 39 y.o. female.   Patient is a 39 year old female with a past medical history of IBS and asthma presenting to the emergency department with abdominal pain.  Patient states for the last 2 days she has had epigastric abdominal pain that feels like a burning type of pain.  She denies any fever, nausea, vomiting.  States that she has had some loose stools.  She states that she called her primary doctor who recommended Tums and Pepto which she has been taking along with Pepcid  without significant relief.  Of note she states that she was seen in the ED last week for pelvic pain that has since improved as well as an allergic reaction that has since resolved.  The history is provided by the patient.  Abdominal Pain      Prior to Admission medications  Medication Sig Start Date End Date Taking? Authorizing Provider  albuterol  (VENTOLIN  HFA) 108 (90 Base) MCG/ACT inhaler 2 puffs every 4-6 hours as needed for coughing or wheezing spells 12/28/23   Cheryl Reusing, FNP  cyclobenzaprine  (FLEXERIL ) 5 MG tablet Take 1 tablet (5 mg total) by mouth 3 (three) times daily as needed for muscle spasms. 05/23/23   Tawkaliyar, Roya, DO  EPINEPHrine  (EPIPEN  2-PAK) 0.3 mg/0.3 mL IJ SOAJ injection Use as directed for severe allergic reactions 12/28/23   Cheryl Reusing, FNP  erythromycin  ophthalmic ointment Place 1 Application into the right eye 3 (three) times daily. 03/03/24   Alvia Bring, DO  fluticasone  (FLONASE ) 50 MCG/ACT nasal spray Place 1 to 2 sprays in each nostril once a day as needed for stuffy nose 12/28/23   Cheryl Reusing, FNP  fluticasone  (FLOVENT  HFA) 110 MCG/ACT inhaler Inhale 2 puffs twice a day with spacer to help prevent cough and wheeze.  Rinse mouth out afterwards 12/28/23   Cheryl Reusing, FNP   lidocaine  (XYLOCAINE ) 2 % solution Use as directed 15 mLs in the mouth or throat as needed for mouth pain. Patient not taking: Reported on 03/27/2024 07/05/23   Veta Alan, PA-C  lubiprostone  (AMITIZA ) 24 MCG capsule TAKE 1 CAPSULE (24 MCG TOTAL) BY MOUTH 2 (TWO) TIMES DAILY WITH A MEAL. Patient not taking: Reported on 03/27/2024 10/22/23   Alvia Bring, DO  meloxicam  (MOBIC ) 7.5 MG tablet Take 1 tab every 12 hours as needed. 06/20/23   Alvia Bring, DO  metaxalone  (SKELAXIN ) 800 MG tablet Take 1 tablet (800 mg total) by mouth 3 (three) times daily as needed for muscle spasms. 06/20/23   Alvia Bring, DO  methocarbamol  (ROBAXIN ) 500 MG tablet Take 1 tablet (500 mg total) by mouth 3 (three) times daily. 09/24/23   Willo Mini, NP  ondansetron  (ZOFRAN -ODT) 4 MG disintegrating tablet Take 1 tablet (4 mg total) by mouth every 8 (eight) hours as needed. 07/08/24   Jerrol Agent, MD  pantoprazole  (PROTONIX ) 40 MG tablet Take 1 tablet (40 mg total) by mouth daily. Patient not taking: Reported on 03/27/2024 09/24/23   Willo Mini, NP  predniSONE  (DELTASONE ) 50 MG tablet One tablet a day beginning 07/10/24 07/09/24   Sofia, Leslie K, PA-C  Spacer/Aero-Holding Chambers DEVI 01 spacer 12/09/21   Cheryl Reusing, FNP    Allergies: Banana extract allergy  skin test, Banana, Cefazolin , Diphenhydramine , and Latex    Review of Systems  Gastrointestinal:  Positive for  abdominal pain.    Updated Vital Signs BP (!) 148/92   Pulse 68   Temp 98.2 F (36.8 C) (Oral)   Resp 16   LMP 06/25/2024   SpO2 100%   Physical Exam Vitals and nursing note reviewed.  Constitutional:      General: She is not in acute distress.    Appearance: She is well-developed.  HENT:     Head: Normocephalic and atraumatic.     Mouth/Throat:     Mouth: Mucous membranes are moist.  Eyes:     Extraocular Movements: Extraocular movements intact.  Cardiovascular:     Rate and Rhythm: Normal rate and regular rhythm.   Pulmonary:     Effort: Pulmonary effort is normal.     Breath sounds: Normal breath sounds.  Abdominal:     General: Abdomen is flat.     Palpations: Abdomen is soft.     Tenderness: There is abdominal tenderness in the epigastric area. There is guarding. There is no rebound.  Skin:    General: Skin is warm and dry.  Neurological:     General: No focal deficit present.     Mental Status: She is alert and oriented to person, place, and time.  Psychiatric:        Mood and Affect: Mood normal.        Behavior: Behavior normal.     (all labs ordered are listed, but only abnormal results are displayed) Labs Reviewed  COMPREHENSIVE METABOLIC PANEL WITH GFR - Abnormal; Notable for the following components:      Result Value   Potassium 3.2 (*)    Total Protein 6.4 (*)    AST 61 (*)    ALT 231 (*)    All other components within normal limits  CBC - Abnormal; Notable for the following components:   RBC 2.95 (*)    Hemoglobin 8.8 (*)    HCT 26.8 (*)    nRBC 0.3 (*)    All other components within normal limits  URINALYSIS, ROUTINE W REFLEX MICROSCOPIC - Abnormal; Notable for the following components:   APPearance HAZY (*)    Specific Gravity, Urine 1.034 (*)    Protein, ur 30 (*)    Bacteria, UA RARE (*)    All other components within normal limits  LIPASE, BLOOD  HCG, SERUM, QUALITATIVE  POC OCCULT BLOOD, ED    EKG: None  Radiology: CT ABDOMEN PELVIS W CONTRAST Result Date: 07/13/2024 EXAM: CT ABDOMEN AND PELVIS WITH CONTRAST 07/13/2024 10:46:41 PM TECHNIQUE: CT of the abdomen and pelvis was performed with the administration of 100 mL of iohexol  (OMNIPAQUE ) 300 MG/ML solution. Multiplanar reformatted images are provided for review. Automated exposure control, iterative reconstruction, and/or weight-based adjustment of the mA/kV was utilized to reduce the radiation dose to as low as reasonably achievable. COMPARISON: 07/08/2024 CLINICAL HISTORY: RUQ pain, elevated LFTs  FINDINGS: LOWER CHEST: Small bilateral pleural effusions. Bibasilar atelectasis. LIVER: The liver is unremarkable. GALLBLADDER AND BILE DUCTS: Pericholecystic fluid and/or gallbladder wall thickening. No visible stones. No biliary ductal dilatation. SPLEEN: No acute abnormality. PANCREAS: No acute abnormality. ADRENAL GLANDS: No acute abnormality. KIDNEYS, URETERS AND BLADDER: No stones in the kidneys or ureters. No hydronephrosis. No perinephric or periureteral stranding. Urinary bladder is unremarkable. GI AND BOWEL: Stomach demonstrates no acute abnormality. There is no bowel obstruction. Normal appendix. PERITONEUM AND RETROPERITONEUM: Small amount of free fluid in the pelvis. No free air. VASCULATURE: Aorta is normal in caliber. LYMPH NODES: No lymphadenopathy. REPRODUCTIVE ORGANS:  No acute abnormality. BONES AND SOFT TISSUES: No acute osseous abnormality. No focal soft tissue abnormality. IMPRESSION: 1. Pericholecystic fluid and/or gallbladder wall thickening without visible stones. Recommend further evaluation with right upper quadrant ultrasound. 2. Small bilateral pleural effusions. Electronically signed by: Franky Crease MD 07/13/2024 10:54 PM EST RP Workstation: [id number]     Procedures   Medications Ordered in the ED  morphine  (PF) 4 MG/ML injection 4 mg (4 mg Intravenous Given 07/13/24 2111)  pantoprazole  (PROTONIX ) injection 40 mg (40 mg Intravenous Given 07/13/24 2111)  metoCLOPramide  (REGLAN ) injection 10 mg (10 mg Intravenous Given 07/13/24 2111)  potassium chloride  SA (KLOR-CON  M) CR tablet 40 mEq (40 mEq Oral Given 07/13/24 2202)  iohexol  (OMNIPAQUE ) 300 MG/ML solution 100 mL (100 mLs Intravenous Contrast Given 07/13/24 2243)    Clinical Course as of 07/13/24 2304  Sun Jul 13, 2024  2121 Hgb slightly lower from baseline. Will offer hemoccult to eval for GIB. [VK]  2138 Patient has new transaminitis compared to labs last week. Will get RUQ US . Will replete potassium. Hemoccult chaparoned by  Stefano Hoover RN - brown stool in the vault.  [VK]  2224 We are without ultrasound coverage this evening, will need to repeat CT imaging. [VK]  2302 CT with new pericholecystic fluid, recommended ultrasound. Patient signed out to Dr. Griselda pending observation vs transfer for ultrasound to further evaluate.  [VK]    Clinical Course User Index [VK] Kingsley, Senaida Chilcote K, DO                                 Medical Decision Making This patient presents to the ED with chief complaint(s) of epigastric pain with pertinent past medical history of IBS, asthma which further complicates the presenting complaint. The complaint involves an extensive differential diagnosis and also carries with it a high risk of complications and morbidity.    The differential diagnosis includes gastritis, GERD, pancreatitis, hepatitis, recent normal CT imaging in the last week making cholelithiasis or cholecystitis unlikely, gastroenteritis, PUD  Additional history obtained: Additional history obtained from N/A Records reviewed Primary Care Documents and recent ED records  ED Course and Reassessment: On patient's arrival she is hemodynamically stable in no acute distress.  Symptoms sound consistent with a gastritis or GERD however without improvement with antacids at home, will have labs and will be treated with pain and nausea control here and will be closely reassessed.  Independent labs interpretation:  The following labs were independently interpreted: new elevated LFTs, slightly worsening anemia from baseline  Independent visualization of imaging: - I independently visualized the following imaging with scope of interpretation limited to determining acute life threatening conditions related to emergency care: CTAP, which revealed pericholecystic fluid with GB wall thickening, no GB stones seen     Amount and/or Complexity of Data Reviewed Labs: ordered. Radiology: ordered.  Risk Prescription drug  management.       Final diagnoses:  Elevated LFTs  Thickening of wall of gallbladder with pericholecystic fluid    ED Discharge Orders     None          Ellouise Richerd POUR, DO 07/13/24 2304  "

## 2024-07-13 NOTE — ED Provider Notes (Signed)
 Care assumed at 2300.  Patient with a right upper quadrant and epigastric abdominal pain.  Care assumed pending CT abdomen pelvis.  CT demonstrates Abran cholecystic fluid and/or gallbladder wall thickening without visible stones.  Her LFTs are mildly elevated.  She does have ongoing pain.  Ultrasound is not available at this facility.  Discussed with patient option for waiting in the emergency department for ultrasound availability at 630 this morning versus transfer to an alternative department.  Patient prefers transfer for ongoing workup.  Plan to transfer POV to [facility] emergency department for additional workup with right upper quadrant ultrasound.  Discussed with Dr. Theadore, who accepted patient in transfer.  Will add on labs for hepatitis although there is low index of suspicion for acute hepatitis at this time.  Pelvic examination with scant discharge, no CMT.  Current picture is not consistent with Fitz-Hugh Curtis/PID.   Griselda Norris, MD 07/14/24 [phone]

## 2024-07-14 ENCOUNTER — Encounter (HOSPITAL_COMMUNITY)
Admission: EM | Disposition: A | Discharge status: Home / Self Care | Payer: Self-pay | Source: Home / Self Care | Attending: Emergency Medicine

## 2024-07-14 ENCOUNTER — Encounter (HOSPITAL_COMMUNITY): Payer: Self-pay

## 2024-07-14 ENCOUNTER — Emergency Department (HOSPITAL_COMMUNITY)

## 2024-07-14 ENCOUNTER — Emergency Department (HOSPITAL_COMMUNITY): Admitting: Anesthesiology

## 2024-07-14 DIAGNOSIS — K810 Acute cholecystitis: Secondary | ICD-10-CM | POA: Diagnosis not present

## 2024-07-14 DIAGNOSIS — J45901 Unspecified asthma with (acute) exacerbation: Secondary | ICD-10-CM | POA: Diagnosis not present

## 2024-07-14 DIAGNOSIS — Z87891 Personal history of nicotine dependence: Secondary | ICD-10-CM | POA: Diagnosis not present

## 2024-07-14 HISTORY — PX: CHOLECYSTECTOMY: SHX55

## 2024-07-14 LAB — HEPATITIS PANEL, ACUTE
HCV Ab: NONREACTIVE
Hep A IgM: NONREACTIVE
Hep B C IgM: NONREACTIVE
Hepatitis B Surface Ag: NONREACTIVE

## 2024-07-14 LAB — WET PREP, GENITAL
Sperm: NONE SEEN
Trich, Wet Prep: NONE SEEN
WBC, Wet Prep HPF POC: <10
Yeast Wet Prep HPF POC: NONE SEEN

## 2024-07-14 LAB — GC/CHLAMYDIA PROBE AMP (~~LOC~~) NOT AT ARMC
Chlamydia: NEGATIVE
Comment: NEGATIVE
Comment: NORMAL
Neisseria Gonorrhea: NEGATIVE

## 2024-07-14 LAB — HIV ANTIBODY (ROUTINE TESTING W REFLEX): HIV Screen 4th Generation wRfx: NONREACTIVE

## 2024-07-14 LAB — SYPHILIS: RPR W/REFLEX TO RPR TITER AND TREPONEMAL ANTIBODIES, TRADITIONAL SCREENING AND DIAGNOSIS ALGORITHM: RPR Ser Ql: NONREACTIVE

## 2024-07-14 SURGERY — LAPAROSCOPIC CHOLECYSTECTOMY
Anesthesia: General

## 2024-07-14 MED ORDER — DROPERIDOL 2.5 MG/ML IJ SOLN
0.6250 mg | Freq: Once | INTRAMUSCULAR | Status: AC
  Administered 2024-07-14: 0.625 mg via INTRAVENOUS

## 2024-07-14 MED ORDER — NEOSTIGMINE METHYLSULFATE 3 MG/3ML IV SOSY
PREFILLED_SYRINGE | INTRAVENOUS | Status: AC
  Filled 2024-07-14: qty 3

## 2024-07-14 MED ORDER — LACTATED RINGERS IV SOLN: INTRAVENOUS | Status: DC | PRN

## 2024-07-14 MED ORDER — ONDANSETRON HCL 4 MG/2ML IJ SOLN: 4.0000 mg | Freq: Once | INTRAMUSCULAR | Status: DC | PRN

## 2024-07-14 MED ORDER — DROPERIDOL 2.5 MG/ML IJ SOLN
INTRAMUSCULAR | Status: AC
  Filled 2024-07-14: qty 2

## 2024-07-14 MED ORDER — METRONIDAZOLE 500 MG/100ML IV SOLN
500.0000 mg | Freq: Two times a day (BID) | INTRAVENOUS | Status: DC
  Administered 2024-07-14: 500 mg via INTRAVENOUS
  Filled 2024-07-14: qty 100

## 2024-07-14 MED ORDER — INDOCYANINE GREEN 25 MG IJ SOLR
1.2500 mg | Freq: Once | INTRAMUSCULAR | Status: AC
  Administered 2024-07-14: 1.25 mg via INTRAVENOUS

## 2024-07-14 MED ORDER — GABAPENTIN 300 MG PO CAPS
300.0000 mg | ORAL_CAPSULE | ORAL | Status: AC
  Administered 2024-07-14: 300 mg via ORAL
  Filled 2024-07-14: qty 1

## 2024-07-14 MED ORDER — OXYCODONE HCL 5 MG PO TABS: 5.0000 mg | ORAL_TABLET | Freq: Once | ORAL | Status: AC | PRN

## 2024-07-14 MED ORDER — ENOXAPARIN SODIUM 40 MG/0.4ML IJ SOSY
40.0000 mg | PREFILLED_SYRINGE | Freq: Once | INTRAMUSCULAR | Status: AC
  Administered 2024-07-14: 40 mg via SUBCUTANEOUS
  Filled 2024-07-14: qty 0.4

## 2024-07-14 MED ORDER — MIDAZOLAM HCL 2 MG/2ML IJ SOLN
INTRAMUSCULAR | Status: AC
  Filled 2024-07-14: qty 2

## 2024-07-14 MED ORDER — HYDROMORPHONE HCL 1 MG/ML IJ SOLN
INTRAMUSCULAR | Status: AC
  Filled 2024-07-14: qty 1

## 2024-07-14 MED ORDER — FENTANYL CITRATE (PF) 250 MCG/5ML IJ SOLN
INTRAMUSCULAR | Status: DC | PRN
  Administered 2024-07-14 (×2): 25 ug via INTRAVENOUS

## 2024-07-14 MED ORDER — ROCURONIUM BROMIDE 10 MG/ML (PF) SYRINGE
PREFILLED_SYRINGE | INTRAVENOUS | Status: AC
  Filled 2024-07-14: qty 10

## 2024-07-14 MED ORDER — LIDOCAINE 2% (20 MG/ML) 5 ML SYRINGE
INTRAMUSCULAR | Status: AC
  Filled 2024-07-14: qty 5

## 2024-07-14 MED ORDER — KETOROLAC TROMETHAMINE 30 MG/ML IJ SOLN
INTRAMUSCULAR | Status: DC | PRN
  Administered 2024-07-14: 30 mg via INTRAVENOUS

## 2024-07-14 MED ORDER — DEXAMETHASONE SOD PHOSPHATE PF 10 MG/ML IJ SOLN
INTRAMUSCULAR | Status: DC | PRN
  Administered 2024-07-14: 10 mg via INTRAVENOUS

## 2024-07-14 MED ORDER — CELECOXIB 200 MG PO CAPS
200.0000 mg | ORAL_CAPSULE | ORAL | Status: AC
  Administered 2024-07-14: 200 mg via ORAL
  Filled 2024-07-14 (×2): qty 1

## 2024-07-14 MED ORDER — ORAL CARE MOUTH RINSE: 15.0000 mL | Freq: Once | OROMUCOSAL | Status: AC

## 2024-07-14 MED ORDER — CIPROFLOXACIN IN D5W 400 MG/200ML IV SOLN
400.0000 mg | Freq: Once | INTRAVENOUS | Status: AC
  Administered 2024-07-14: 400 mg via INTRAVENOUS
  Filled 2024-07-14: qty 200

## 2024-07-14 MED ORDER — LIDOCAINE 2% (20 MG/ML) 5 ML SYRINGE
INTRAMUSCULAR | Status: DC | PRN
  Administered 2024-07-14: 100 mg via INTRAVENOUS

## 2024-07-14 MED ORDER — SPY AGENT GREEN - (INDOCYANINE FOR INJECTION): 1.2500 mg | Freq: Once | INTRAMUSCULAR | Status: DC

## 2024-07-14 MED ORDER — FENTANYL CITRATE (PF) 100 MCG/2ML IJ SOLN
INTRAMUSCULAR | Status: AC
  Filled 2024-07-14: qty 2

## 2024-07-14 MED ORDER — OXYCODONE HCL 5 MG/5ML PO SOLN
ORAL | Status: AC
  Filled 2024-07-14: qty 5

## 2024-07-14 MED ORDER — CHLORHEXIDINE GLUCONATE 0.12 % MT SOLN: 15.0000 mL | Freq: Once | OROMUCOSAL | Status: AC

## 2024-07-14 MED ORDER — ACETAMINOPHEN 500 MG PO TABS
1000.0000 mg | ORAL_TABLET | ORAL | Status: AC
  Administered 2024-07-14: 1000 mg via ORAL
  Filled 2024-07-14: qty 2

## 2024-07-14 MED ORDER — PROPOFOL 10 MG/ML IV BOLUS
INTRAVENOUS | Status: DC | PRN
  Administered 2024-07-14: 140 mg via INTRAVENOUS

## 2024-07-14 MED ORDER — LACTATED RINGERS IV SOLN: INTRAVENOUS | Status: DC

## 2024-07-14 MED ORDER — OXYCODONE-ACETAMINOPHEN 5-325 MG PO TABS: 1.0000 | ORAL_TABLET | ORAL | 0 refills | Status: AC | PRN

## 2024-07-14 MED ORDER — KETOROLAC TROMETHAMINE 30 MG/ML IJ SOLN: 30.0000 mg | Freq: Once | INTRAMUSCULAR | Status: DC | PRN

## 2024-07-14 MED ORDER — ONDANSETRON HCL 4 MG/2ML IJ SOLN
INTRAMUSCULAR | Status: AC
  Filled 2024-07-14: qty 2

## 2024-07-14 MED ORDER — ROCURONIUM BROMIDE 10 MG/ML (PF) SYRINGE
PREFILLED_SYRINGE | INTRAVENOUS | Status: DC | PRN
  Administered 2024-07-14: 50 mg via INTRAVENOUS

## 2024-07-14 MED ORDER — DEXMEDETOMIDINE HCL IN NACL 80 MCG/20ML IV SOLN
INTRAVENOUS | Status: DC | PRN
  Administered 2024-07-14: 8 ug via INTRAVENOUS

## 2024-07-14 MED ORDER — HYDROMORPHONE HCL 1 MG/ML IJ SOLN
0.2500 mg | INTRAMUSCULAR | Status: DC | PRN
  Administered 2024-07-14: 0.5 mg via INTRAVENOUS

## 2024-07-14 MED ORDER — ACETAMINOPHEN 500 MG PO TABS: 1000.0000 mg | ORAL_TABLET | Freq: Once | ORAL | Status: DC

## 2024-07-14 MED ORDER — OXYCODONE HCL 5 MG/5ML PO SOLN
5.0000 mg | Freq: Once | ORAL | Status: AC | PRN
  Administered 2024-07-14: 5 mg via ORAL

## 2024-07-14 MED ORDER — CHLORHEXIDINE GLUCONATE 0.12 % MT SOLN
OROMUCOSAL | Status: AC
  Administered 2024-07-14: 15 mL via OROMUCOSAL
  Filled 2024-07-14: qty 15

## 2024-07-14 MED ORDER — SUGAMMADEX SODIUM 200 MG/2ML IV SOLN
INTRAVENOUS | Status: DC | PRN
  Administered 2024-07-14: 150 mg via INTRAVENOUS
  Administered 2024-07-14: 25 mg via INTRAVENOUS

## 2024-07-14 MED ORDER — ONDANSETRON HCL 4 MG/2ML IJ SOLN
INTRAMUSCULAR | Status: DC | PRN
  Administered 2024-07-14: 4 mg via INTRAVENOUS

## 2024-07-14 MED ORDER — MIDAZOLAM HCL (PF) 2 MG/2ML IJ SOLN
INTRAMUSCULAR | Status: DC | PRN
  Administered 2024-07-14: 2 mg via INTRAVENOUS

## 2024-07-14 NOTE — Anesthesia Preprocedure Evaluation (Addendum)
 "                                  Anesthesia Evaluation  Patient identified by MRN, date of birth, ID band Patient awake    Reviewed: Allergy  & Precautions, NPO status , Patient's Chart, lab work & pertinent test results  History of Anesthesia Complications Negative for: history of anesthetic complications  Airway Mallampati: I  TM Distance: >3 FB Neck ROM: Full    Dental no notable dental hx. (+) Teeth Intact, Dental Advisory Given   Pulmonary asthma , Patient abstained from smoking., former smoker   Pulmonary exam normal breath sounds clear to auscultation       Cardiovascular (-) hypertension(-) angina (-) Past MI Normal cardiovascular exam Rhythm:Regular Rate:Normal     Neuro/Psych    GI/Hepatic ,GERD  ,,  Endo/Other  neg diabetes    Renal/GU Lab Results      Component                Value               Date                      K                        3.2 (L)             07/13/2024                     CREATININE               0.84                07/13/2024                     Musculoskeletal   Abdominal   Peds  Hematology  (+) Blood dyscrasia, anemia Lab Results      Component                Value               Date                      WBC                      7.9                 07/13/2024                HGB                      8.8 (L)             07/13/2024                HCT                      26.8 (L)            07/13/2024                MCV                      90.8  07/13/2024                PLT                      359                 07/13/2024              Anesthesia Other Findings All: latex, cefazolin ,   Reproductive/Obstetrics negative OB ROS                              Anesthesia Physical Anesthesia Plan  ASA: 3 and emergent  Anesthesia Plan: General   Post-op Pain Management: Toradol  IV (intra-op)*, Tylenol  PO (pre-op)* and Precedex    Induction: Intravenous  PONV Risk  Score and Plan: 4 or greater and Treatment may vary due to age or medical condition, Midazolam , Dexamethasone  and Ondansetron   Airway Management Planned: Oral ETT  Additional Equipment:   Intra-op Plan:   Post-operative Plan: Extubation in OR  Informed Consent: I have reviewed the patients History and Physical, chart, labs and discussed the procedure including the risks, benefits and alternatives for the proposed anesthesia with the patient or authorized representative who has indicated his/her understanding and acceptance.     Dental advisory given  Plan Discussed with: CRNA and Surgeon  Anesthesia Plan Comments:          Anesthesia Quick Evaluation  "

## 2024-07-14 NOTE — ED Provider Notes (Signed)
 Patient transferred from [facility] for right upper quadrant ultrasound imaging.  Right upper quadrant ultrasound is concerning for acalculous cholecystitis.  Worsening symptoms over the last 2 days.  On my evaluation she is awake alert.  Reports some improvement in pain with morphine .  Not had any fevers.  On exam she continues to have tenderness in the epigastrium and right upper quadrant.  Will consult with general surgery.  She has a cephalosporin allergy .  Given IV Cipro .  She is NPO.   Tina Charmaine FALCON, MD 07/14/24 [phone]

## 2024-07-14 NOTE — H&P (View-Only) (Signed)
 "   Reason for Consult:  cholecystitis Referring Provider: Bari, MD  HPI  Tina [name] is an 39 y.o. female with history of IBS and asthma who presented to [facility] with abdominal pain.  Patient states she has had burning epigastric pain for the last 2 days. No associated nausea/vomiting or fevers. She has never had episodes like this in the past.  Labs are notable for elevated AST/ALT to 61/231. No leukocytosis. Anemic to 8.8/26.8. CT this evening showed pericholecystic fluid with GB wall thickening without stones. US  showed markedly thickened gallbladder measuring 9mm with pericholecystic fluid. No cholelithiasis noted. CBD 3mm.  Of note she presented to [facility] on 12/30 due to pelvic pain and workup unremarkable.  Patient received cipro  in [facility].  10 point review of systems is negative except as listed above in HPI.  Objective  Past Medical History: Past Medical History:  Diagnosis Date   Allergy     Asthma    Heart murmur     Past Surgical History: Past Surgical History:  Procedure Laterality Date   BREAST EXCISIONAL BIOPSY Left 2007   CESAREAN SECTION N/A 10/20/2020   Procedure: CESAREAN SECTION;  Surgeon: Horacio Boas, MD;  Location: [facility] LD ORS;  Service: Obstetrics;  Laterality: N/A;  FHR Decelerations   COLPOSCOPY     LAPAROSCOPY     WISDOM TOOTH EXTRACTION      Family History:  Family History  Problem Relation Age of Onset   Diabetes Mother    Hypertension Father    Asthma Father    Diabetes Father    Cancer Maternal Aunt        colon   Lupus Maternal Aunt    Diabetes Maternal Aunt    Breast cancer Maternal Grandmother    Breast cancer Paternal Grandmother    Diabetes Paternal Grandmother    Hypertension Maternal Aunt    Diabetes Maternal Aunt    Diabetes Maternal Aunt    Allergic rhinitis Sister    Food Allergy  Sister    Eczema Sister    Asthma Maternal Uncle    Diabetes Paternal Grandfather     Social History:  reports that she has quit smoking.  Her smoking use included e-cigarettes. She has never used smokeless tobacco. She reports that she does not currently use alcohol. She reports that she does not use drugs.  Allergies: Allergies[1]  Medications: I have reviewed the patient's current medications.  Labs: I have personally reviewed all labs for the past 24h  Imaging: I have personally reviewed and interpreted all imaging for the past 24h and agree with the radiologist's impression.  US  Abdomen Limited RUQ (LIVER/GB) Result Date: 07/14/2024 EXAM: Right Upper Quadrant Abdominal Ultrasound 07/14/2024 02:42:12 AM TECHNIQUE: Real-time ultrasonography of the right upper quadrant of the abdomen was performed. COMPARISON: CT earlier today. CLINICAL HISTORY: RUQ abdominal pain. FINDINGS: LIVER: Normal echogenicity. No intrahepatic biliary ductal dilatation. No evidence of mass. Hepatopetal flow in the portal vein. BILIARY SYSTEM: Markedly thickened gallbladder wall measuring up to 9 mm in thickness. Pericholecystic fluid noted. No cholelithiasis. Common bile duct is within normal limits measuring 3 mm. RIGHT KIDNEY: No hydronephrosis. No echogenic calculi. No mass. PANCREAS: Visualized portions of the pancreas are unremarkable. OTHER: No right upper quadrant ascites. IMPRESSION: 1. Findings concerning for acute cholecystitis, possibly acalculous. This could be further evaluated with a nuclear medicine hepatobiliary scan if felt clinically indicated. Electronically signed by: Franky Crease MD 07/14/2024 02:45 AM EST RP Workstation: [id number]   CT ABDOMEN PELVIS W CONTRAST Result  Date: 07/13/2024 EXAM: CT ABDOMEN AND PELVIS WITH CONTRAST 07/13/2024 10:46:41 PM TECHNIQUE: CT of the abdomen and pelvis was performed with the administration of 100 mL of iohexol  (OMNIPAQUE ) 300 MG/ML solution. Multiplanar reformatted images are provided for review. Automated exposure control, iterative reconstruction, and/or weight-based adjustment of the mA/kV was utilized  to reduce the radiation dose to as low as reasonably achievable. COMPARISON: 07/08/2024 CLINICAL HISTORY: RUQ pain, elevated LFTs FINDINGS: LOWER CHEST: Small bilateral pleural effusions. Bibasilar atelectasis. LIVER: The liver is unremarkable. GALLBLADDER AND BILE DUCTS: Pericholecystic fluid and/or gallbladder wall thickening. No visible stones. No biliary ductal dilatation. SPLEEN: No acute abnormality. PANCREAS: No acute abnormality. ADRENAL GLANDS: No acute abnormality. KIDNEYS, URETERS AND BLADDER: No stones in the kidneys or ureters. No hydronephrosis. No perinephric or periureteral stranding. Urinary bladder is unremarkable. GI AND BOWEL: Stomach demonstrates no acute abnormality. There is no bowel obstruction. Normal appendix. PERITONEUM AND RETROPERITONEUM: Small amount of free fluid in the pelvis. No free air. VASCULATURE: Aorta is normal in caliber. LYMPH NODES: No lymphadenopathy. REPRODUCTIVE ORGANS: No acute abnormality. BONES AND SOFT TISSUES: No acute osseous abnormality. No focal soft tissue abnormality. IMPRESSION: 1. Pericholecystic fluid and/or gallbladder wall thickening without visible stones. Recommend further evaluation with right upper quadrant ultrasound. 2. Small bilateral pleural effusions. Electronically signed by: Franky Crease MD 07/13/2024 10:54 PM EST RP Workstation: [id number]     Physical Exam Blood pressure (!) 154/95, pulse 74, temperature 98.1 F (36.7 C), resp. rate 16, last menstrual period 06/25/2024, SpO2 100%. General: no acute distress HEENT: normocephalic, atraumatic Oropharynx: mucous membranes moist CV: Regular rate and rhythm, normotensive Chest: equal chest rise bilaterally normal respiratory effort on room air Abdomen: soft, nondistended, and severely tender to palpation in epigastrium and RUQ Extremities: moves all extremities Skin: warm, dry, no rashes Psych: normal memory, normal mood/affect  Neuro: No focal neurologic deficits, A&Ox3     Assessment   Tina [name] is an 39 y.o. female with acute acalculous cholecystitis  Plan  - Keep NPO - Will add flagyl  given severity of GB thickening/pericholecystic fluid and degree and duration of pain - Will plan to proceed to OR later today for laparoscopic cholecystectomy - We discussed the etiology of patient's pain, we discussed treatment options and recommended surgery. We discussed details of surgery including general anesthesia, laparoscopic approach, identification of cystic duct and common bile duct. Ligation of cystic duct and cystic artery. Possible need for intraoperative cholangiogram, open procedure, and subtotal cholecystectomy. Possible risks of common bile duct injury, injury to surrounding structures, bile leak, bleeding, infection, diarrhea, retained stone and hernia. The patient showed good understanding and all questions were answered   I reviewed [facility] provider notes, last 24 h vitals and pain scores, last 48 h intake and output, last 24 h labs and trends, and last 24 h imaging results. I discussed plan of care directly with patient and with Dr. Bari.  This care required high  level of medical decision making.    Orie Silversmith, MD General Surgery, Surgical Critical Care and Trauma        [1]  Allergies Allergen Reactions   Banana Extract Allergy  Skin Test Anaphylaxis   Banana Swelling    Throat swelling   Cefazolin  Swelling    Facial edema (no airway edema) during c-section after ancef . Has not been formally tested for cephalosporin allergy .    Diphenhydramine  Hives   Latex Hives and Swelling   "

## 2024-07-14 NOTE — Consult Note (Signed)
 "   Reason for Consult:  cholecystitis Referring Provider: Bari, MD  HPI  Tina [name] is an 39 y.o. female with history of IBS and asthma who presented to [facility] with abdominal pain.  Patient states she has had burning epigastric pain for the last 2 days. No associated nausea/vomiting or fevers. She has never had episodes like this in the past.  Labs are notable for elevated AST/ALT to 61/231. No leukocytosis. Anemic to 8.8/26.8. CT this evening showed pericholecystic fluid with GB wall thickening without stones. US  showed markedly thickened gallbladder measuring 9mm with pericholecystic fluid. No cholelithiasis noted. CBD 3mm.  Of note she presented to [facility] on 12/30 due to pelvic pain and workup unremarkable.  Patient received cipro  in [facility].  10 point review of systems is negative except as listed above in HPI.  Objective  Past Medical History: Past Medical History:  Diagnosis Date   Allergy     Asthma    Heart murmur     Past Surgical History: Past Surgical History:  Procedure Laterality Date   BREAST EXCISIONAL BIOPSY Left 2007   CESAREAN SECTION N/A 10/20/2020   Procedure: CESAREAN SECTION;  Surgeon: Horacio Boas, MD;  Location: [facility] LD ORS;  Service: Obstetrics;  Laterality: N/A;  FHR Decelerations   COLPOSCOPY     LAPAROSCOPY     WISDOM TOOTH EXTRACTION      Family History:  Family History  Problem Relation Age of Onset   Diabetes Mother    Hypertension Father    Asthma Father    Diabetes Father    Cancer Maternal Aunt        colon   Lupus Maternal Aunt    Diabetes Maternal Aunt    Breast cancer Maternal Grandmother    Breast cancer Paternal Grandmother    Diabetes Paternal Grandmother    Hypertension Maternal Aunt    Diabetes Maternal Aunt    Diabetes Maternal Aunt    Allergic rhinitis Sister    Food Allergy  Sister    Eczema Sister    Asthma Maternal Uncle    Diabetes Paternal Grandfather     Social History:  reports that she has quit smoking.  Her smoking use included e-cigarettes. She has never used smokeless tobacco. She reports that she does not currently use alcohol. She reports that she does not use drugs.  Allergies: Allergies[1]  Medications: I have reviewed the patient's current medications.  Labs: I have personally reviewed all labs for the past 24h  Imaging: I have personally reviewed and interpreted all imaging for the past 24h and agree with the radiologist's impression.  US  Abdomen Limited RUQ (LIVER/GB) Result Date: 07/14/2024 EXAM: Right Upper Quadrant Abdominal Ultrasound 07/14/2024 02:42:12 AM TECHNIQUE: Real-time ultrasonography of the right upper quadrant of the abdomen was performed. COMPARISON: CT earlier today. CLINICAL HISTORY: RUQ abdominal pain. FINDINGS: LIVER: Normal echogenicity. No intrahepatic biliary ductal dilatation. No evidence of mass. Hepatopetal flow in the portal vein. BILIARY SYSTEM: Markedly thickened gallbladder wall measuring up to 9 mm in thickness. Pericholecystic fluid noted. No cholelithiasis. Common bile duct is within normal limits measuring 3 mm. RIGHT KIDNEY: No hydronephrosis. No echogenic calculi. No mass. PANCREAS: Visualized portions of the pancreas are unremarkable. OTHER: No right upper quadrant ascites. IMPRESSION: 1. Findings concerning for acute cholecystitis, possibly acalculous. This could be further evaluated with a nuclear medicine hepatobiliary scan if felt clinically indicated. Electronically signed by: Franky Crease MD 07/14/2024 02:45 AM EST RP Workstation: [id number]   CT ABDOMEN PELVIS W CONTRAST Result  Date: 07/13/2024 EXAM: CT ABDOMEN AND PELVIS WITH CONTRAST 07/13/2024 10:46:41 PM TECHNIQUE: CT of the abdomen and pelvis was performed with the administration of 100 mL of iohexol  (OMNIPAQUE ) 300 MG/ML solution. Multiplanar reformatted images are provided for review. Automated exposure control, iterative reconstruction, and/or weight-based adjustment of the mA/kV was utilized  to reduce the radiation dose to as low as reasonably achievable. COMPARISON: 07/08/2024 CLINICAL HISTORY: RUQ pain, elevated LFTs FINDINGS: LOWER CHEST: Small bilateral pleural effusions. Bibasilar atelectasis. LIVER: The liver is unremarkable. GALLBLADDER AND BILE DUCTS: Pericholecystic fluid and/or gallbladder wall thickening. No visible stones. No biliary ductal dilatation. SPLEEN: No acute abnormality. PANCREAS: No acute abnormality. ADRENAL GLANDS: No acute abnormality. KIDNEYS, URETERS AND BLADDER: No stones in the kidneys or ureters. No hydronephrosis. No perinephric or periureteral stranding. Urinary bladder is unremarkable. GI AND BOWEL: Stomach demonstrates no acute abnormality. There is no bowel obstruction. Normal appendix. PERITONEUM AND RETROPERITONEUM: Small amount of free fluid in the pelvis. No free air. VASCULATURE: Aorta is normal in caliber. LYMPH NODES: No lymphadenopathy. REPRODUCTIVE ORGANS: No acute abnormality. BONES AND SOFT TISSUES: No acute osseous abnormality. No focal soft tissue abnormality. IMPRESSION: 1. Pericholecystic fluid and/or gallbladder wall thickening without visible stones. Recommend further evaluation with right upper quadrant ultrasound. 2. Small bilateral pleural effusions. Electronically signed by: Franky Crease MD 07/13/2024 10:54 PM EST RP Workstation: [id number]     Physical Exam Blood pressure (!) 154/95, pulse 74, temperature 98.1 F (36.7 C), resp. rate 16, last menstrual period 06/25/2024, SpO2 100%. General: no acute distress HEENT: normocephalic, atraumatic Oropharynx: mucous membranes moist CV: Regular rate and rhythm, normotensive Chest: equal chest rise bilaterally normal respiratory effort on room air Abdomen: soft, nondistended, and severely tender to palpation in epigastrium and RUQ Extremities: moves all extremities Skin: warm, dry, no rashes Psych: normal memory, normal mood/affect  Neuro: No focal neurologic deficits, A&Ox3     Assessment   Tina [name] is an 39 y.o. female with acute acalculous cholecystitis  Plan  - Keep NPO - Will add flagyl  given severity of GB thickening/pericholecystic fluid and degree and duration of pain - Will plan to proceed to OR later today for laparoscopic cholecystectomy - We discussed the etiology of patient's pain, we discussed treatment options and recommended surgery. We discussed details of surgery including general anesthesia, laparoscopic approach, identification of cystic duct and common bile duct. Ligation of cystic duct and cystic artery. Possible need for intraoperative cholangiogram, open procedure, and subtotal cholecystectomy. Possible risks of common bile duct injury, injury to surrounding structures, bile leak, bleeding, infection, diarrhea, retained stone and hernia. The patient showed good understanding and all questions were answered   I reviewed [facility] provider notes, last 24 h vitals and pain scores, last 48 h intake and output, last 24 h labs and trends, and last 24 h imaging results. I discussed plan of care directly with patient and with Dr. Bari.  This care required high  level of medical decision making.    Orie Silversmith, MD General Surgery, Surgical Critical Care and Trauma        [1]  Allergies Allergen Reactions   Banana Extract Allergy  Skin Test Anaphylaxis   Banana Swelling    Throat swelling   Cefazolin  Swelling    Facial edema (no airway edema) during c-section after ancef . Has not been formally tested for cephalosporin allergy .    Diphenhydramine  Hives   Latex Hives and Swelling   "

## 2024-07-14 NOTE — Op Note (Signed)
 07/14/2024  10:16 AM  PATIENT:  Tina [name]  39 y.o. female  PRE-OPERATIVE DIAGNOSIS:  acute cholecystitis  POST-OPERATIVE DIAGNOSIS:  acute cholecystitis  PROCEDURE:  Procedures: LAPAROSCOPIC CHOLECYSTECTOMY (N/A)  SURGEON:  Surgeons and Role:    DEWAINE Rubin Calamity, MD - Primary  ASSISTANTS: Waddell Collier, RNFA   ANESTHESIA:   local and general  EBL:  5 mL   BLOOD ADMINISTERED:none  DRAINS: none   LOCAL MEDICATIONS USED:  BUPIVICAINE   SPECIMEN:  Source of Specimen:  gallbladder  DISPOSITION OF SPECIMEN:  PATHOLOGY  COUNTS:  YES  TOURNIQUET:  * No tourniquets in log *  DICTATION: .Dragon Dictation The patient was taken to the operating and placed in the supine position with bilateral SCDs in place.  The patient was prepped and draped in the usual sterile fashion. A time out was called and all facts were verified. A pneumoperitoneum was obtained via A Veress needle technique to a pressure of 14mm of mercury.  A 5mm trochar was then placed in the right upper quadrant under visualization, and there were no injuries to any abdominal organs. A 11 mm port was then placed in the umbilical region after infiltrating with local anesthesia under direct visualization. A second and third epigastric port and right lower quadrant port placement under direct visualization, respectively.    The gallbladder was identified and retracted, the peritoneum was then sharply dissected from the gallbladder and this dissection was carried down to Calot's triangle. The cystic duct was identified and stripped away circumferentially and seen going into the gallbladder 360, the critical angle was obtained.  2 clips were placed proximally one distally and the cystic duct transected. The cystic artery was identified and 2 clips placed proximally and one distally and transected.  We then proceeded to remove the gallbladder off the hepatic fossa with Bovie cautery. A retrieval bag was then placed in  the abdomen and gallbladder placed in the bag. The hepatic fossa was then reexamined and hemostasis was achieved with Bovie cautery and was excellent at the end of the case.   The subhepatic fossa and perihepatic fossa was then irrigated until the effluent was clear.  The gallbladder and bag were removed from the abdominal cavity. The 11 mm trocar fascia was reapproximated with the Endo Close #1 Vicryl x2.  The pneumoperitoneum was evacuated and all trochars removed under direct visulalization.  The skin was then closed with 4-0 Monocryl and the skin dressed with Dermabond.    The patient was awaken from general anesthesia and taken to the recovery room in stable condition.    PLAN OF CARE: Discharge to home after PACU  PATIENT DISPOSITION:  PACU - hemodynamically stable.   Delay start of Pharmacological VTE agent (>24hrs) due to surgical blood loss or risk of bleeding: not applicable

## 2024-07-14 NOTE — ED Notes (Signed)
 Pt ambulatory out of ED with a ride waiting for her to take her to [facility].

## 2024-07-14 NOTE — Interval H&P Note (Signed)
 History and Physical Interval Note:  07/14/2024 8:56 AM  Tina [name]  has presented today for surgery, with the diagnosis of acute cholecystitis.  The various methods of treatment have been discussed with the patient and family. After consideration of risks, benefits and other options for treatment, the patient has consented to  Procedures: LAPAROSCOPIC CHOLECYSTECTOMY (N/A) as a surgical intervention.  The patient's history has been reviewed, patient examined, no change in status, stable for surgery.  I have reviewed the patient's chart and labs.  Questions were answered to the patient's satisfaction.     Amber Williard

## 2024-07-14 NOTE — Transfer of Care (Signed)
 Immediate Anesthesia Transfer of Care Note  Patient: Tina [name]  Procedure(s) Performed: LAPAROSCOPIC CHOLECYSTECTOMY  Patient Location: PACU  Anesthesia Type:General  Level of Consciousness: drowsy, patient cooperative, and responds to stimulation  Airway & Oxygen  Therapy: Patient Spontanous Breathing and Patient connected to nasal cannula oxygen   Post-op Assessment: Report given to RN and Post -op Vital signs reviewed and stable  Post vital signs: Reviewed and stable  Last Vitals:  Vitals Value Taken Time  BP 158/99 07/14/24 10:32  Temp    Pulse 66 07/14/24 10:33  Resp 18 07/14/24 10:33  SpO2 93 % 07/14/24 10:33  Vitals shown include unfiled device data.  Last Pain:  Vitals:   07/14/24 0826  TempSrc: Oral  PainSc: 6      Pt nauseous in pacu, RN's contacting Dr. Jefm for prn orders.   Patients Stated Pain Goal: 0 (07/13/24 2047)  Complications: No notable events documented.

## 2024-07-14 NOTE — ED Notes (Signed)
 Report called and given to [facility] CN Brittany.

## 2024-07-14 NOTE — ED Notes (Signed)
Received report from charge RN.

## 2024-07-14 NOTE — Anesthesia Procedure Notes (Signed)
 Procedure Name: Intubation Date/Time: 07/14/2024 9:40 AM  Performed by: Oley Aleck LABOR, CRNAPre-anesthesia Checklist: Patient identified, Emergency Drugs available, Suction available and Patient being monitored Patient Re-evaluated:Patient Re-evaluated prior to induction Oxygen  Delivery Method: Circle system utilized Preoxygenation: Pre-oxygenation with 100% oxygen  Induction Type: IV induction Ventilation: Mask ventilation without difficulty Laryngoscope Size: Mac and 3 Grade View: Grade I Tube type: Oral Tube size: 7.0 mm Number of attempts: 1 Airway Equipment and Method: Stylet Placement Confirmation: ETT inserted through vocal cords under direct vision, positive ETCO2 and breath sounds checked- equal and bilateral Tube secured with: Tape Dental Injury: Teeth and Oropharynx as per pre-operative assessment  Comments: intubated by C. Niti Leisure, CRNA; ebbs

## 2024-07-14 NOTE — Anesthesia Postprocedure Evaluation (Signed)
"   Anesthesia Post Note  Patient: [facility] [name]  Procedure(s) Performed: LAPAROSCOPIC CHOLECYSTECTOMY     Patient location during evaluation: PACU Anesthesia Type: General Level of consciousness: awake and alert Pain management: pain level controlled Vital Signs Assessment: post-procedure vital signs reviewed and stable Respiratory status: spontaneous breathing, nonlabored ventilation, respiratory function stable and patient connected to nasal cannula oxygen  Cardiovascular status: blood pressure returned to baseline and stable Postop Assessment: no apparent nausea or vomiting Anesthetic complications: no   There were no known notable events for this encounter.  Last Vitals:  Vitals:   07/14/24 1115 07/14/24 1130  BP: (!) 146/92 137/83  Pulse: 66 64  Resp: 15 14  Temp:  36.7 C  SpO2: 94% 96%    Last Pain:  Vitals:   07/14/24 1141  TempSrc:   PainSc: 4                  Garnette LABOR Danisha Brassfield      "

## 2024-07-14 NOTE — Discharge Instructions (Signed)
 [facility] ______[facility] LAPAROSCOPIC SURGERY: POST OP INSTRUCTIONS Always review your discharge instruction sheet given to you by the facility where your surgery was performed. IF YOU HAVE DISABILITY OR FAMILY LEAVE FORMS, YOU MUST BRING THEM TO THE OFFICE FOR PROCESSING.   DO NOT GIVE THEM TO YOUR DOCTOR.  A prescription for pain medication may be given to you upon discharge.  Take your pain medication as prescribed, if needed.  If narcotic pain medicine is not needed, then you may take acetaminophen  (Tylenol ) or ibuprofen  (Advil ) as needed. Take your usually prescribed medications unless otherwise directed. If you need a refill on your pain medication, please contact your pharmacy.  They will contact our office to request authorization. Prescriptions will not be filled after 5pm or on week-ends. You should follow a light diet the first few days after arrival home, such as soup and crackers, etc.  Be sure to include lots of fluids daily. Most patients will experience some swelling and bruising in the area of the incisions.  Ice packs will help.  Swelling and bruising can take several days to resolve.  It is common to experience some constipation if taking pain medication after surgery.  Increasing fluid intake and taking a stool softener (such as Colace) will usually help or prevent this problem from occurring.  A mild laxative (Milk of Magnesia or Miralax) should be taken according to package instructions if there are no bowel movements after 48 hours. Unless discharge instructions indicate otherwise, you may remove your bandages 24-48 hours after surgery, and you may shower at that time.  You may have steri-strips (small skin tapes) in place directly over the incision.  These strips should be left on the skin for 7-10 days.  If your surgeon used skin glue on the incision, you may shower in 24 hours.  The glue will flake off over the next 2-3 weeks.  Any sutures or staples will be  removed at the office during your follow-up visit. ACTIVITIES:  You may resume regular (light) daily activities beginning the next day--such as daily self-care, walking, climbing stairs--gradually increasing activities as tolerated.  You may have sexual intercourse when it is comfortable.  Refrain from any heavy lifting or straining until approved by your doctor. You may drive when you are no longer taking prescription pain medication, you can comfortably wear a seatbelt, and you can safely maneuver your car and apply brakes. RETURN TO WORK:  __________________________________________________________ Tina [name] should see your doctor in the office for a follow-up appointment approximately 2-3 weeks after your surgery.  Make sure that you call for this appointment within a day or two after you arrive home to insure a convenient appointment time. OTHER INSTRUCTIONS: __________________________________________________________________________________________________________________________ __________________________________________________________________________________________________________________________ WHEN TO CALL YOUR DOCTOR: Fever over 101.0 Inability to urinate Continued bleeding from incision. Increased pain, redness, or drainage from the incision. Increasing abdominal pain  The clinic staff is available to answer your questions during regular business hours.  Please don't hesitate to call and ask to speak to one of the nurses for clinical concerns.  If you have a medical emergency, go to the nearest emergency room or call 911.  A surgeon from [facility] is always on call at the hospital. [address], [address], [place], [place]  [address] ? [address] [address], [place], [place]   [address] [phone] ? [phone] ? FAX [phone] Web site: [email or web address]

## 2024-07-15 ENCOUNTER — Encounter (HOSPITAL_COMMUNITY): Payer: Self-pay | Admitting: General Surgery

## 2024-07-15 LAB — SURGICAL PATHOLOGY

## 2024-07-16 ENCOUNTER — Inpatient Hospital Stay: Admitting: Family Medicine

## 2024-07-21 ENCOUNTER — Encounter: Payer: Self-pay | Admitting: Family Medicine

## 2024-07-21 ENCOUNTER — Ambulatory Visit: Admitting: Family Medicine

## 2024-07-21 VITALS — BP 121/74 | HR 84 | Ht 59.0 in | Wt 123.0 lb

## 2024-07-21 DIAGNOSIS — R7989 Other specified abnormal findings of blood chemistry: Secondary | ICD-10-CM | POA: Diagnosis not present

## 2024-07-21 DIAGNOSIS — K819 Cholecystitis, unspecified: Secondary | ICD-10-CM | POA: Insufficient documentation

## 2024-07-21 NOTE — Patient Instructions (Signed)
 Minimally Invasive Cholecystectomy, Care After What can I expect after the procedure? After the procedure, it is common to: Have pain at the areas of surgery. You will be given medicines for pain. Vomit or feel like you may vomit. Feel fullness in the belly (bloating) or have pain in the shoulder. This comes from the gas that was used during the surgery. Follow these instructions at home: Medicines Take over-the-counter and prescription medicines only as told by your doctor. If you were prescribed an antibiotic medicine, take it as told by your doctor. Do not stop taking it even if you start to feel better. If told, take steps to prevent problems with pooping (constipation). You may need to: Drink enough fluid to keep your pee (urine) pale yellow. Take medicines. You will be told what medicines to take. Eat foods that are high in fiber. These include beans, whole grains, and fresh fruits and vegetables. Limit foods that are high in fat and sugar. These include fried or sweet foods. Ask your doctor if you should avoid driving or using machines while you are taking your medicine. Incision care  Follow instructions from your doctor about how to take care of your cuts from surgery (incisions). Make sure you: Wash your hands with soap and water for at least 20 seconds before and after you change your bandage (dressing). If you cannot use soap and water, use hand sanitizer. Change your bandage. Leave stitches (sutures) or skin glue in place for at least 2 weeks. Leave tape strips alone unless you are told to take them off. You may trim the edges of the tape strips if they curl up. Do not take baths, swim, or use a hot tub. Ask your doctor about taking showers or sponge baths. Check your incision area every day for signs of infection. Check for: More redness, swelling, or pain. Fluid or blood. Warmth. Pus or a bad smell. Activity Rest as told by your doctor. Do not do activities that require a  lot of effort. Get up to take short walks every 1 to 2 hours. Ask for help if you feel weak or unsteady. Do not lift anything that is heavier than 10 lb (4.5 kg), or the limit that you are told. Do not play contact sports until your doctor says it is okay. Do not return to work or school until your doctor says it is okay. Return to your normal activities when your doctor says that it is safe. General instructions If you were given a sedative during your procedure, do not drive or use machines until your doctor says that it is safe. A sedative is a medicine that helps you relax. Keep all follow-up visits. Contact a doctor if: You get a rash. You have more redness, swelling, or pain around your incisions. You have fluid or blood coming from your incisions. Your incisions feel warm to the touch. You have pus or a bad smell coming from your incisions. You have a fever. One or more of your incisions breaks open. Get help right away if: You have trouble breathing. You have chest pain. You have pain that is getting worse in your shoulders. You faint or feel dizzy when you stand. You have very bad pain in your belly (abdomen). You feel like you may vomit or you vomit, and this lasts for more than one day. You have leg pain. These symptoms may be an emergency. Get help right away. Call 911. Do not wait to see if the symptoms will  go away. Do not drive yourself to the hospital. Summary After your surgery, it is common to have pain at the areas of surgery. You may also vomit or feel fullness in the belly. Follow your doctor's instructions about medicine, activity restrictions, and caring for your surgery areas. Do not do activities that require a lot of effort. Contact a doctor if you have a fever or other signs of infection, such as more redness, swelling, or pain around your incisions. Get help right away if you have chest pain, increasing pain in the shoulders, or trouble breathing. This  information is not intended to replace advice given to you by your health care provider. Make sure you discuss any questions you have with your health care provider. Document Revised: 12/27/2020 Document Reviewed: 12/28/2020 Elsevier Patient Education  2024 [employer].

## 2024-07-21 NOTE — Assessment & Plan Note (Signed)
 S/p cholecystectomy.  Doing pretty well at this point.  Encouraged f/u with surgeon.  Red flags reviewed. Updated CMP and CBC today.

## 2024-07-21 NOTE — Progress Notes (Signed)
 " Tina [name] - 39 y.o. female MRN [id number]  Date of birth: 06-05-1986  Subjective Chief Complaint  Patient presents with   Hospitalization Follow-up    HPI Tina [name] is a 39 y.o. female here today for follow up visit.   Recent hospitalization for cholecystitis. S/p cholecystectomy.  Reports that she is doing pretty well.  She has not schedule f/u with surgeon yet.  She is still using pain medication occasionally.  She is having some soft stools. She is modifying her diet to help with this.  She denies fever, chills, cough or dyspnea.     ROS:  A comprehensive ROS was completed and negative except as noted per HPI  Allergies[1]  Past Medical History:  Diagnosis Date   Allergy     Asthma    Heart murmur     Past Surgical History:  Procedure Laterality Date   BREAST EXCISIONAL BIOPSY Left 2007   CESAREAN SECTION N/A 10/20/2020   Procedure: CESAREAN SECTION;  Surgeon: Horacio Boas, MD;  Location: [facility] LD ORS;  Service: Obstetrics;  Laterality: N/A;  FHR Decelerations   CHOLECYSTECTOMY N/A 07/14/2024   Procedure: LAPAROSCOPIC CHOLECYSTECTOMY;  Surgeon: Rubin Calamity, MD;  Location: [facility] OR;  Service: General;  Laterality: N/A;   COLPOSCOPY     LAPAROSCOPY     WISDOM TOOTH EXTRACTION      Social History   Socioeconomic History   Marital status: Single    Spouse name: Not on file   Number of children: Not on file   Years of education: Not on file   Highest education level: Not on file  Occupational History   Not on file  Tobacco Use   Smoking status: Former    Types: E-cigarettes   Smokeless tobacco: Never  Vaping Use   Vaping status: Never Used  Substance and Sexual Activity   Alcohol use: Not Currently    Comment: occ   Drug use: No   Sexual activity: Yes    Birth control/protection: Condom  Other Topics Concern   Not on file  Social History Narrative   Not on file   Social Drivers of Health   Tobacco Use: Medium Risk (07/21/2024)    Patient History    Smoking Tobacco Use: Former    Smokeless Tobacco Use: Never    Passive Exposure: Not on Actuary Strain: Not on file  Food Insecurity: No Food Insecurity (12/27/2023)   Epic    Worried About Programme Researcher, Broadcasting/film/video in the Last Year: Never true    Ran Out of Food in the Last Year: Never true  Transportation Needs: No Transportation Needs (12/27/2023)   Epic    Lack of Transportation (Medical): No    Lack of Transportation (Non-Medical): No  Physical Activity: Not on file  Stress: Not on file  Social Connections: Moderately Isolated (12/27/2023)   Social Connection and Isolation Panel    Frequency of Communication with Friends and Family: Three times a week    Frequency of Social Gatherings with Friends and Family: Once a week    Attends Religious Services: Never    Database Administrator or Organizations: Yes    Attends Banker Meetings: 1 to 4 times per year    Marital Status: Never married  Depression (PHQ2-9): Medium Risk (07/21/2024)   Depression (PHQ2-9)    PHQ-2 Score: 9  Alcohol Screen: Low Risk (12/27/2023)   Alcohol Screen    Last Alcohol Screening Score (AUDIT): 0  Housing:  Low Risk (12/27/2023)   Epic    Unable to Pay for Housing in the Last Year: No    Number of Times Moved in the Last Year: 0    Homeless in the Last Year: No  Utilities: Not At Risk (12/27/2023)   Epic    Threatened with loss of utilities: No  Health Literacy: Adequate Health Literacy (12/27/2023)   B1300 Health Literacy    Frequency of need for help with medical instructions: Never    Family History  Problem Relation Age of Onset   Diabetes Mother    Hypertension Father    Asthma Father    Diabetes Father    Cancer Maternal Aunt        colon   Lupus Maternal Aunt    Diabetes Maternal Aunt    Breast cancer Maternal Grandmother    Breast cancer Paternal Grandmother    Diabetes Paternal Grandmother    Hypertension Maternal Aunt    Diabetes Maternal  Aunt    Diabetes Maternal Aunt    Allergic rhinitis Sister    Food Allergy  Sister    Eczema Sister    Asthma Maternal Uncle    Diabetes Paternal Grandfather     Health Maintenance  Topic Date Due   Pneumococcal Vaccine (1 of 2 - PCV) Never done   Hepatitis B Vaccines 19-59 Average Risk (1 of 3 - 19+ 3-dose series) Never done   HPV VACCINES (1 - Risk 3-dose SCDM series) Never done   Cervical Cancer Screening (HPV/Pap Cotest)  Never done   COVID-19 Vaccine (1) 08/06/2024 (Originally 08/25/1986)   Influenza Vaccine  10/07/2024 (Originally 02/08/2024)   DTaP/Tdap/Td (1 - Tdap) 07/21/2025 (Originally 02/21/2006)   Hepatitis C Screening  Completed   HIV Screening  Completed   Meningococcal B Vaccine  Aged Out     ----------------------------------------------------------------------------------------------------------------------------------------------------------------------------------------------------------------- Physical Exam BP 121/74 (BP Location: Left Arm, Patient Position: Sitting, Cuff Size: Normal)   Pulse 84   Ht 4' 11 (1.499 m)   Wt 123 lb (55.8 kg)   LMP 06/25/2024   SpO2 100%   BMI 24.84 kg/m   Physical Exam Constitutional:      Appearance: Normal appearance.  Cardiovascular:     Rate and Rhythm: Normal rate and regular rhythm.  Pulmonary:     Effort: Pulmonary effort is normal.     Breath sounds: Normal breath sounds.  Abdominal:     General: There is no distension.     Palpations: Abdomen is soft.     Tenderness: There is abdominal tenderness (mild R side ttp.).     Comments: Incisions C/D/I.  No drainage.    Neurological:     Mental Status: She is alert.  Psychiatric:        Mood and Affect: Mood normal.        Behavior: Behavior normal.      ------------------------------------------------------------------------------------------------------------------------------------------------------------------------------------------------------------------- Assessment and Plan  Cholecystitis S/p cholecystectomy.  Doing pretty well at this point.  Encouraged f/u with surgeon.  Red flags reviewed. Updated CMP and CBC today.    No orders of the defined types were placed in this encounter.   No follow-ups on file.        [1]  Allergies Allergen Reactions   Banana Extract Allergy  Skin Test Anaphylaxis   Banana Hives and Swelling    Throat swelling   Cefazolin  Swelling    Facial edema (no airway edema) during c-section after ancef . Has not been formally tested for cephalosporin allergy .    Diphenhydramine   Hives   Latex Hives and Swelling   "

## 2024-07-22 LAB — CBC WITH DIFFERENTIAL/PLATELET
Basophils Absolute: 0.1 x10E3/uL (ref 0.0–0.2)
Basos: 1 %
EOS (ABSOLUTE): 0.2 x10E3/uL (ref 0.0–0.4)
Eos: 2 %
Hematocrit: 33.8 % — ABNORMAL LOW (ref 34.0–46.6)
Hemoglobin: 10.7 g/dL — ABNORMAL LOW (ref 11.1–15.9)
Immature Grans (Abs): 0.1 x10E3/uL (ref 0.0–0.1)
Immature Granulocytes: 1 %
Lymphocytes Absolute: 1.6 x10E3/uL (ref 0.7–3.1)
Lymphs: 18 %
MCH: 29.9 pg (ref 26.6–33.0)
MCHC: 31.7 g/dL (ref 31.5–35.7)
MCV: 94 fL (ref 79–97)
Monocytes Absolute: 0.7 x10E3/uL (ref 0.1–0.9)
Monocytes: 7 %
Neutrophils Absolute: 6.5 x10E3/uL (ref 1.4–7.0)
Neutrophils: 71 %
Platelets: 564 x10E3/uL — ABNORMAL HIGH (ref 150–450)
RBC: 3.58 x10E6/uL — ABNORMAL LOW (ref 3.77–5.28)
RDW: 13.6 % (ref 11.7–15.4)
WBC: 9.1 x10E3/uL (ref 3.4–10.8)

## 2024-07-22 LAB — CMP14+EGFR
ALT: 54 IU/L — ABNORMAL HIGH (ref 0–32)
AST: 17 IU/L (ref 0–40)
Albumin: 4.6 g/dL (ref 3.9–4.9)
Alkaline Phosphatase: 56 IU/L (ref 41–116)
BUN/Creatinine Ratio: 15 (ref 9–23)
BUN: 11 mg/dL (ref 6–20)
Bilirubin Total: 0.4 mg/dL (ref 0.0–1.2)
CO2: 22 mmol/L (ref 20–29)
Calcium: 9.7 mg/dL (ref 8.7–10.2)
Chloride: 101 mmol/L (ref 96–106)
Creatinine, Ser: 0.73 mg/dL (ref 0.57–1.00)
Globulin, Total: 2.7 g/dL (ref 1.5–4.5)
Glucose: 119 mg/dL — ABNORMAL HIGH (ref 70–99)
Potassium: 4.3 mmol/L (ref 3.5–5.2)
Sodium: 139 mmol/L (ref 134–144)
Total Protein: 7.3 g/dL (ref 6.0–8.5)
eGFR: 109 mL/min/1.73

## 2024-07-25 ENCOUNTER — Ambulatory Visit: Payer: Self-pay | Admitting: Family Medicine
